# Patient Record
Sex: Female | Born: 1965 | Race: White | Hispanic: No | Marital: Single | Smoking: Current every day smoker
Health system: Southern US, Community
[De-identification: ages and names within clinical notes are randomized; demographics above are authoritative.]

## PROBLEM LIST (undated history)

## (undated) DIAGNOSIS — K589 Irritable bowel syndrome without diarrhea: Secondary | ICD-10-CM

## (undated) DIAGNOSIS — I10 Essential (primary) hypertension: Secondary | ICD-10-CM

## (undated) DIAGNOSIS — F329 Major depressive disorder, single episode, unspecified: Secondary | ICD-10-CM

## (undated) DIAGNOSIS — M1990 Unspecified osteoarthritis, unspecified site: Secondary | ICD-10-CM

## (undated) DIAGNOSIS — F32A Depression, unspecified: Secondary | ICD-10-CM

## (undated) DIAGNOSIS — F419 Anxiety disorder, unspecified: Secondary | ICD-10-CM

## (undated) HISTORY — PX: APPENDECTOMY: SHX54

## (undated) HISTORY — PX: PILONIDAL CYST EXCISION: SHX744

## (undated) HISTORY — DX: Anxiety disorder, unspecified: F41.9

## (undated) HISTORY — PX: ROTATOR CUFF REPAIR: SHX139

## (undated) HISTORY — DX: Unspecified osteoarthritis, unspecified site: M19.90

---

## 2000-07-15 ENCOUNTER — Other Ambulatory Visit: Admission: RE | Admit: 2000-07-15 | Discharge: 2000-07-15 | Payer: Self-pay | Admitting: *Deleted

## 2000-11-28 ENCOUNTER — Inpatient Hospital Stay (HOSPITAL_COMMUNITY): Admission: RE | Admit: 2000-11-28 | Discharge: 2000-11-28 | Payer: Self-pay | Admitting: *Deleted

## 2001-05-04 ENCOUNTER — Observation Stay (HOSPITAL_COMMUNITY): Admission: AD | Admit: 2001-05-04 | Discharge: 2001-05-05 | Payer: Self-pay | Admitting: Obstetrics & Gynecology

## 2001-08-21 ENCOUNTER — Inpatient Hospital Stay (HOSPITAL_COMMUNITY): Admission: AD | Admit: 2001-08-21 | Discharge: 2001-08-21 | Payer: Self-pay | Admitting: *Deleted

## 2001-08-21 ENCOUNTER — Inpatient Hospital Stay (HOSPITAL_COMMUNITY): Admission: AD | Admit: 2001-08-21 | Discharge: 2001-08-25 | Payer: Self-pay | Admitting: Obstetrics and Gynecology

## 2001-09-24 ENCOUNTER — Other Ambulatory Visit: Admission: RE | Admit: 2001-09-24 | Discharge: 2001-09-24 | Payer: Self-pay | Admitting: *Deleted

## 2002-04-04 ENCOUNTER — Encounter: Payer: Self-pay | Admitting: *Deleted

## 2002-04-04 ENCOUNTER — Emergency Department (HOSPITAL_COMMUNITY): Admission: EM | Admit: 2002-04-04 | Discharge: 2002-04-04 | Payer: Self-pay | Admitting: *Deleted

## 2002-09-27 ENCOUNTER — Other Ambulatory Visit: Admission: RE | Admit: 2002-09-27 | Discharge: 2002-09-27 | Payer: Self-pay | Admitting: *Deleted

## 2003-11-14 ENCOUNTER — Other Ambulatory Visit: Admission: RE | Admit: 2003-11-14 | Discharge: 2003-11-14 | Payer: Self-pay | Admitting: *Deleted

## 2004-03-03 ENCOUNTER — Emergency Department (HOSPITAL_COMMUNITY): Admission: EM | Admit: 2004-03-03 | Discharge: 2004-03-03 | Payer: Self-pay | Admitting: Emergency Medicine

## 2004-03-04 ENCOUNTER — Ambulatory Visit (HOSPITAL_COMMUNITY): Admission: RE | Admit: 2004-03-04 | Discharge: 2004-03-04 | Payer: Self-pay | Admitting: Emergency Medicine

## 2004-06-30 ENCOUNTER — Emergency Department (HOSPITAL_COMMUNITY): Admission: EM | Admit: 2004-06-30 | Discharge: 2004-06-30 | Payer: Self-pay | Admitting: Emergency Medicine

## 2005-01-07 ENCOUNTER — Ambulatory Visit (HOSPITAL_COMMUNITY): Admission: RE | Admit: 2005-01-07 | Discharge: 2005-01-07 | Payer: Self-pay | Admitting: Internal Medicine

## 2005-01-10 ENCOUNTER — Ambulatory Visit (HOSPITAL_COMMUNITY): Admission: RE | Admit: 2005-01-10 | Discharge: 2005-01-10 | Payer: Self-pay | Admitting: Internal Medicine

## 2005-02-12 ENCOUNTER — Encounter (HOSPITAL_COMMUNITY): Admission: RE | Admit: 2005-02-12 | Discharge: 2005-03-20 | Payer: Self-pay | Admitting: Neurosurgery

## 2005-04-07 ENCOUNTER — Emergency Department (HOSPITAL_COMMUNITY): Admission: EM | Admit: 2005-04-07 | Discharge: 2005-04-07 | Payer: Self-pay | Admitting: Emergency Medicine

## 2005-12-07 ENCOUNTER — Emergency Department (HOSPITAL_COMMUNITY): Admission: EM | Admit: 2005-12-07 | Discharge: 2005-12-07 | Payer: Self-pay | Admitting: Emergency Medicine

## 2006-09-23 ENCOUNTER — Ambulatory Visit (HOSPITAL_COMMUNITY): Admission: RE | Admit: 2006-09-23 | Discharge: 2006-09-23 | Payer: Self-pay | Admitting: *Deleted

## 2007-05-04 ENCOUNTER — Emergency Department (HOSPITAL_COMMUNITY): Admission: EM | Admit: 2007-05-04 | Discharge: 2007-05-04 | Payer: Self-pay | Admitting: Emergency Medicine

## 2007-10-05 ENCOUNTER — Ambulatory Visit (HOSPITAL_COMMUNITY): Admission: RE | Admit: 2007-10-05 | Discharge: 2007-10-05 | Payer: Self-pay | Admitting: *Deleted

## 2007-10-12 ENCOUNTER — Encounter: Admission: RE | Admit: 2007-10-12 | Discharge: 2007-10-12 | Payer: Self-pay | Admitting: *Deleted

## 2008-09-24 ENCOUNTER — Emergency Department (HOSPITAL_COMMUNITY): Admission: EM | Admit: 2008-09-24 | Discharge: 2008-09-24 | Payer: Self-pay | Admitting: Emergency Medicine

## 2009-10-08 ENCOUNTER — Ambulatory Visit (HOSPITAL_COMMUNITY): Admission: RE | Admit: 2009-10-08 | Discharge: 2009-10-08 | Payer: Self-pay | Admitting: Obstetrics and Gynecology

## 2009-12-26 ENCOUNTER — Ambulatory Visit (HOSPITAL_COMMUNITY): Admission: RE | Admit: 2009-12-26 | Discharge: 2009-12-26 | Payer: Self-pay | Admitting: Family Medicine

## 2010-09-12 ENCOUNTER — Emergency Department (HOSPITAL_COMMUNITY): Admission: EM | Admit: 2010-09-12 | Discharge: 2010-09-12 | Payer: Self-pay | Admitting: Emergency Medicine

## 2010-09-17 ENCOUNTER — Ambulatory Visit (HOSPITAL_COMMUNITY)
Admission: RE | Admit: 2010-09-17 | Discharge: 2010-09-17 | Payer: Self-pay | Source: Home / Self Care | Admitting: Internal Medicine

## 2010-12-01 ENCOUNTER — Encounter: Payer: Self-pay | Admitting: *Deleted

## 2011-01-01 ENCOUNTER — Other Ambulatory Visit (HOSPITAL_COMMUNITY): Payer: Self-pay | Admitting: Obstetrics and Gynecology

## 2011-01-01 DIAGNOSIS — Z1231 Encounter for screening mammogram for malignant neoplasm of breast: Secondary | ICD-10-CM

## 2011-02-06 ENCOUNTER — Ambulatory Visit (INDEPENDENT_AMBULATORY_CARE_PROVIDER_SITE_OTHER): Payer: PRIVATE HEALTH INSURANCE | Admitting: Otolaryngology

## 2011-02-06 DIAGNOSIS — K2100 Gastro-esophageal reflux disease with esophagitis, without bleeding: Secondary | ICD-10-CM

## 2011-02-06 DIAGNOSIS — R070 Pain in throat: Secondary | ICD-10-CM

## 2011-02-10 ENCOUNTER — Ambulatory Visit (HOSPITAL_COMMUNITY)
Admission: RE | Admit: 2011-02-10 | Discharge: 2011-02-10 | Disposition: A | Discharge status: Home / Self Care | Payer: PRIVATE HEALTH INSURANCE | Source: Ambulatory Visit | Attending: Obstetrics and Gynecology | Admitting: Obstetrics and Gynecology

## 2011-02-10 DIAGNOSIS — Z1231 Encounter for screening mammogram for malignant neoplasm of breast: Secondary | ICD-10-CM | POA: Insufficient documentation

## 2011-03-06 ENCOUNTER — Ambulatory Visit (INDEPENDENT_AMBULATORY_CARE_PROVIDER_SITE_OTHER): Payer: PRIVATE HEALTH INSURANCE | Admitting: Otolaryngology

## 2011-03-06 DIAGNOSIS — D487 Neoplasm of uncertain behavior of other specified sites: Secondary | ICD-10-CM

## 2011-03-06 DIAGNOSIS — H9209 Otalgia, unspecified ear: Secondary | ICD-10-CM

## 2011-03-06 DIAGNOSIS — K2100 Gastro-esophageal reflux disease with esophagitis, without bleeding: Secondary | ICD-10-CM

## 2011-03-06 DIAGNOSIS — R070 Pain in throat: Secondary | ICD-10-CM

## 2011-03-28 NOTE — H&P (Signed)
[facility]  PatientSHANTERRIA, Dawn Edwards Visit Number: [id number] MRN: [id number]          Service Type: OBS Location: 910B 9162 01 Attending Physician:  Lenoard Aden Dictated by:   Lenoard Aden, M.D. Admit Date:  08/21/2001                           History and Physical  CHIEF COMPLAINT:              LGA polyhydramnios for induction.  HISTORY OF PRESENT ILLNESS:  The patient is a 45 year old white female G1, P0, [facility] August 18, 2001 at 40 4/7 weeks with polyhydramnios and estimated fetal weight of 9 pounds for induction.  ALLERGIES:                    ANTI-INFLAMMATORY DRUGS which cause stomach discomfort.  MEDICATIONS:                  Prenatal vitamins.  PREGNANCY HISTORY:            Noncontributory.  GYN HISTORY:                  History of HSV, history of pilonidal cyst removal, history of ovarian cystectomy, history of foot surgery.  FAMILY HISTORY:               Cardiovascular disease and hypertension.  PRENATAL LAB DATA:            Blood type O+, Rh antibody negative, rubella immune, hepatitis B surface antigen negative, HIV nonreactive.  The patient had a triple screen drawn at 16 weeks and declined amniocentesis. She had a normal anatomical survey at 20 weeks, previous history of viral gastroenteritis in the second trimester.  PHYSICAL EXAMINATION:  GENERAL:                      She is a well-developed, well-nourished, white female in no apparent distress.  HEENT:                        Normal.  LUNGS:                        Clear.  HEART:                        Regular rhythm.  ABDOMEN:                      Soft, gravid, nontender. Cervix is fingertip long, vertex and -3.  EXTREMITIES:                  Revealed no ______.  NEUROLOGIC:                   Nonfocal. ST is reactive. Cervidil is placed.  IMPRESSION:                   1. 40 week intrauterine pregnancy.                               2.  Polyhydramnios.  3. Presumed LGA.  PLAN:                         Cervical ripening with Cervidil and proceed with induction. Monitor fetal heart rate. Anticipate attempts at vaginal delivery. Dictated by:   Lenoard Aden, M.D. Attending Physician:  Lenoard Aden DD:  08/22/01 TD:  08/22/01 Job: [id number]

## 2011-03-28 NOTE — H&P (Signed)
[facility]  PatientLeeya, Rusconi Edwards Visit Number: [id number] MRN: [id number]          Service Type: Attending:  Marina Gravel, M.D. Dictated by:   Marina Gravel, M.D. Adm. Date:  08/22/01                           History and Physical  DATE OF BIRTH:                1965-12-09  ADMISSION DIAGNOSES:          1. Post dates pregnancy.                               2. Suspected fetal macrosomia.  PLANNED PROCEDURE:            Induction of labor.  HISTORY OF PRESENT ILLNESS:   Patient is a 45 year old white female gravida 1 para 0 who is admitted at 40 weeks and four days for induction of labor. Ultrasound on August 20, 2001 showed 4069 g fetus with polyhydramnios.  Prenatal care at [facility], Dr. Earlene Plater, complicated by advanced maternal age.  The patient declined amniocentesis but had a normal triple screen. Otherwise, prenatal care has been uncomplicated.  Patient conceived by IUI and is a smoker that stopped during pregnancy.  PAST MEDICAL HISTORY:         None.  PAST SURGICAL HISTORY:        Ovarian cystectomy and appendectomy in childhood.  Also pilonidal cyst removal and foot surgery.  MEDICATIONS:                  Prenatal vitamins.  ALLERGIES:                    None.  HABITS:                       Tobacco use - patient stopped during pregnancy. No alcohol or other drugs.  FAMILY HISTORY:               Noncontributory.  PHYSICAL EXAMINATION:  VITAL SIGNS:                  Weight 201.8, blood pressure 120/80, fetal heart tones 143.  GENERAL:                      Alert and oriented, no acute distress.  SKIN:                         Warm and dry, no lesions.  HEART:                        Regular rate and rhythm.  LUNGS:                        Clear to auscultation.  ABDOMEN:                      Gravid.  Fundal height 40 cm.  PELVIC:                       Cervical exam is 1 cm dilated, uneffaced, high presenting  part, vertex by ultrasound.  PRENATAL LABORATORY DATA:  O positive.  Rubella immune.  Hepatitis B, HIV, GC, chlamydia, group B strep - all negative.  Glucola 179; three-hour OGTT within normal limits.  ASSESSMENT:                   Post dates pregnancy with suspected fetal macrosomia and unfavorable cervix.  PLAN:                         Admission for cervical ripening and subsequent induction of labor.  Discussed with patient potential for increased C section rate, given above risk factors.Dictated by:   Marina Gravel, M.D.  Attending:  Marina Gravel, M.D. DD:  08/20/01 TD:  08/20/01 Job: [id number]

## 2011-03-28 NOTE — Op Note (Signed)
[facility]  PatientKINDA, Dawn Edwards Visit Number: [id number] MRN: [id number]          Service Type: OBS Location: MATC Attending Physician:  Lenoard Aden Proc. Date: 08/22/01 Admit Date:  08/21/2001 Discharge Date: 08/21/2001                             Operative Report  PREOPERATIVE DIAGNOSIS:       Failure to progress.  POSTOPERATIVE DIAGNOSIS:      Failure to progress.  OPERATION:                    Primary low transverse cesarean section.  SURGEON:                      Marina Gravel, M.D.  ASSISTANT:                    Lenoard Aden, M.D.  ANESTHESIA:                   Epidural.  ESTIMATED BLOOD LOSS:         700 cc.  COMPLICATIONS:                None.  DISPOSITION:                  Recovery room, stable.  SPECIMENS:                    None.  DRAINS:                       Foley catheter.  INDICATIONS:                  Patient was recommended to have primary low transverse cesarean section for diagnosis of failure to progress.  Has been 4 to 5 cm for greater than six hours.  Documented adequate labor for approximately two hours.  FINDINGS:                     Viable female infant, 8 and 9 Apgars, weight 8 pounds, 6 ounces. Normal uterus, tubes and ovaries.  DESCRIPTION OF PROCEDURE:     Patient was taken to the operating room with epidural anesthesia in place.  She was prepped and draped in a standard fashion and the bladder was already being drained with a Foley catheter.  Pfannenstiel incision was made with a knife and carried sharply to the underlying fascia.  The fascia was divided in the midline sharply and the incision extended laterally with Mayo scissors.  A Kocher clamp was used to elevate the superior aspect of the incision and the underlying rectus muscles were dissected off sharply, repeated inferiorly in a similar fashion.  The midline of the rectus muscles was identified, the underlying peritoneum and  posterior sheath elevated and entered sharply.  The incision extended superiorly and inferiorly sharply with good visualization of the surrounding organs.  The bladder blade was inserted and the vesicouterine peritoneum identified, elevated, and bladder flap created with sharp and blunt technique.  The uterine incision was made in a low transverse fashion with a knife. Extended laterally with bandage scissors.  The infants head was elevated into the incision and delivered with the assistance of a vacuum with one traction. The nose and mouth were suctioned with the bulb  and the remainder of the infant delivered without difficulty. The cord was then clamped and cut and the infant handed off to the waiting pediatricians.  Two grams of Ancef was given at cord clamp.  The uterus was left in the abdomen and the placenta removed by expression. The uterus was then cleared of all clots and debris.  The uterine incision was inspected.  It was free of extension.  The incision was then closed with a running locked stitch of 0 monocryl. Hemostasis obtained.  The bladder flap was hemostatic.  The subfascial space was inspected and was hemostatic.  The fascia was closed with a running stitch of 0 Vicryl.  The subcutaneous tissue was irrigated and made hemostatic with the Bovie.  The skin was closed with staples.  The patient tolerated the procedure well. There were no complications.  She was taken to the recovery room awake, alert and in stable condition.  All counts were correct per the operating room staff. Attending Physician:  Lenoard Aden DD:  08/22/01 TD:  08/22/01 Job: [id number]

## 2011-03-28 NOTE — Discharge Summary (Signed)
[facility]  PatientSANTINA, Dawn Edwards Visit Number: [id number] MRN: [id number]          Service Type: OBS Location: 910A 9130 01 Attending Physician:  Lenoard Aden Dictated by:   Marina Gravel, M.D. Admit Date:  08/21/2001 Discharge Date: 08/25/2001                             Discharge Summary  ADMISSION DIAGNOSES:          1. Post dates pregnancy                               2. Suspected fetal macrosomia.  DISCHARGE DIAGNOSES:          1. Post dates pregnancy.                               2. Suspected fetal macrosomia.  PROCEDURES:                   1. Induction of labor.                               2. Primary cesarean section for failure to                                  progress.  HISTORY OF PRESENT ILLNESS:   For complete details, please see the History & Physical on the chart.  Briefly, the patient was admitted at 40 weeks and fur days for induction of labor with an estimated fetal weight of 4069 g and polyhydramnios.  HOSPITAL COURSE:              The patient was admitted and cervix ripened using Cytotec overnight, and Pitocin was started the following morning. Membranes were ruptured, and the patient progressed to 4 to 5 cm.  She did not progress beyond this for greater than six hours with documented active labor with IUPC for two hours despite being on Pitocin in the range of 35 mL per minute.  She was, therefore, delivered by primary low transverse cesarean section. Findings at the time of surgery included a viable female infant 8 pounds 6 ounces, Apgars 8/9, normal uterus, tubes, and ovaries.  There were no complications, and the patient tolerated the procedure well.  Postoperatively, the patient rapidly regained her ability to ambulate, void, and tolerate a regular diet.  The patient was noted to be O positive and rubella immune.  Her postoperative hemoglobin was stable and in the normal range.  The patient was  discharged home in satisfactory condition.  DISCHARGE INSTRUCTIONS:       Standard preprinted discharge instructions were to dismissal.  FOLLOWUP:                     [facility] with Dr. Earlene Plater in four to six weeks. Dictated by:   Marina Gravel, M.D. Attending Physician:  Lenoard Aden DD:  08/26/01 TD:  08/26/01 Job: [id number]

## 2012-02-02 ENCOUNTER — Other Ambulatory Visit (HOSPITAL_COMMUNITY): Payer: Self-pay | Admitting: Obstetrics and Gynecology

## 2012-02-02 DIAGNOSIS — Z1231 Encounter for screening mammogram for malignant neoplasm of breast: Secondary | ICD-10-CM

## 2012-03-01 ENCOUNTER — Ambulatory Visit (HOSPITAL_COMMUNITY)
Admission: RE | Admit: 2012-03-01 | Discharge: 2012-03-01 | Disposition: A | Discharge status: Home / Self Care | Payer: BC Managed Care – PPO | Source: Ambulatory Visit | Attending: Obstetrics and Gynecology | Admitting: Obstetrics and Gynecology

## 2012-03-01 DIAGNOSIS — Z1231 Encounter for screening mammogram for malignant neoplasm of breast: Secondary | ICD-10-CM | POA: Insufficient documentation

## 2013-06-24 ENCOUNTER — Emergency Department (HOSPITAL_COMMUNITY): Payer: Medicaid Other

## 2013-06-24 ENCOUNTER — Emergency Department (HOSPITAL_COMMUNITY)
Admission: EM | Admit: 2013-06-24 | Discharge: 2013-06-24 | Disposition: A | Discharge status: Home / Self Care | Payer: Medicaid Other | Attending: Emergency Medicine | Admitting: Emergency Medicine

## 2013-06-24 ENCOUNTER — Encounter (HOSPITAL_COMMUNITY): Payer: Self-pay | Admitting: Emergency Medicine

## 2013-06-24 DIAGNOSIS — R51 Headache: Secondary | ICD-10-CM | POA: Insufficient documentation

## 2013-06-24 DIAGNOSIS — R079 Chest pain, unspecified: Secondary | ICD-10-CM

## 2013-06-24 DIAGNOSIS — R0789 Other chest pain: Secondary | ICD-10-CM | POA: Insufficient documentation

## 2013-06-24 DIAGNOSIS — F17200 Nicotine dependence, unspecified, uncomplicated: Secondary | ICD-10-CM | POA: Insufficient documentation

## 2013-06-24 LAB — BASIC METABOLIC PANEL
Chloride: 103 mEq/L (ref 96–112)
Creatinine, Ser: 0.97 mg/dL (ref 0.50–1.10)
GFR calc Af Amer: 79 mL/min — ABNORMAL LOW (ref >90)
GFR calc non Af Amer: 68 mL/min — ABNORMAL LOW (ref >90)
Glucose, Bld: 108 mg/dL — ABNORMAL HIGH (ref 70–99)
Potassium: 3.2 mEq/L — ABNORMAL LOW (ref 3.5–5.1)
Sodium: 137 mEq/L (ref 135–145)

## 2013-06-24 LAB — CBC WITH DIFFERENTIAL/PLATELET
Basophils Absolute: 0 10*3/uL (ref 0.0–0.1)
Eosinophils Absolute: 0.2 10*3/uL (ref 0.0–0.7)
Hemoglobin: 12.7 g/dL (ref 12.0–15.0)
Lymphocytes Relative: 26 % (ref 12–46)
Lymphs Abs: 2.6 10*3/uL (ref 0.7–4.0)
MCHC: 34.2 g/dL (ref 30.0–36.0)
Monocytes Absolute: 0.6 10*3/uL (ref 0.1–1.0)
Neutrophils Relative %: 66 % (ref 43–77)
RBC: 4.19 MIL/uL (ref 3.87–5.11)
RDW: 12.5 % (ref 11.5–15.5)

## 2013-06-24 NOTE — ED Notes (Signed)
Pt c/o central chest pressure started around 9am. Constant. Denies dizziness/sob/n/v. Denies radiation of chest pressure. Denies anything making it worse or better. Nondiaphoretic. States always "stressed" but no worse today than usual. Headache since July 21st constant. Has not seen pcp for it. Pt c/o "weak eye" to r eye lid. Denies trouble swallowing/walking/speaking. Denies weakness. Diarrhea once this am-denies black or bloody stools.

## 2013-06-24 NOTE — ED Provider Notes (Signed)
CSN: [id number]     Arrival date & time 06/24/13  1328 History  This chart was scribed for Dawn Hutching, MD by Shari Heritage, ED Scribe. The patient was seen in room APA07/APA07. Patient's care was started at 1504.   First MD Initiated Contact with Patient 06/24/13 1504     Chief Complaint  Patient presents with  . Chest Pain    The history is provided by the patient. No language interpreter was used.   HPI Comments: Dawn Edwards is a 47 y.o. female who presents to the Emergency Department complaining of central chest pain onset this morning that is now resolved. She describes the pain as "tightness." Patient is also complaining of an intermittent right parietal headache for the past 2-3 weeks. She reports that pain worsens occasionally with extraocular movements. She has been taking 200 mg ibuprofen at a time which used to provide relief, but no longer improves head pain. She reports right ptosis as well, but she has not seen a physician for this or the recurrent headache. She also reports elevated blood pressure (with most recent reading of 156/99). She says that she has a blood pressure monitor at home, but she usually measures her BP at [facility]. She has never been diagnosed with hypertension by a clinician. She denies shortness of breath, fever, diaphoresis, nausea or vomiting. She has no history of diabetes. Patient's family history includes MI (father at age 41). Patient smokes 1/3 packs of cigarettes per day. She works for a [employer] in [place].   PCP - [place]  History reviewed. No pertinent past medical history. Past Surgical History  Procedure Laterality Date  . Cesarean section     History reviewed. No pertinent family history. History  Substance Use Topics  . Smoking status: Current Every Day Smoker  . Smokeless tobacco: Not on file  . Alcohol Use: No   OB History   Grav Para Term Preterm Abortions TAB SAB Ect Mult Living                 Review of Systems A complete 10  system review of systems was obtained and all systems are negative except as noted in the HPI and PMH.   Allergies  Review of patient's allergies indicates not on file.  Home Medications   Current Outpatient Rx  Name  Route  Sig  Dispense  Refill  . ibuprofen (ADVIL,MOTRIN) 200 MG tablet   Oral   Take 600 mg by mouth every 6 (six) hours as needed for pain.          Triage Vitals: BP 150/67  Pulse 72  Temp(Src) 98.3 F (36.8 C) (Oral)  Resp 20  SpO2 98%  LMP 06/24/2013  Physical Exam  Constitutional: She is oriented to person, place, and time. She appears well-developed and well-nourished.  HENT:  Head: Normocephalic and atraumatic.  Eyes: Conjunctivae and EOM are normal. Pupils are equal, round, and reactive to light.  Neck: Normal range of motion. Neck supple.  Cardiovascular: Normal rate, regular rhythm and normal heart sounds.   Pulmonary/Chest: Effort normal and breath sounds normal. No respiratory distress.  Abdominal: Bowel sounds are normal.  Musculoskeletal: Normal range of motion.  Neurological: She is alert and oriented to person, place, and time.  Skin: Skin is warm and dry. No rash noted.    ED Course   Procedures (including critical care time) DIAGNOSTIC STUDIES: Oxygen Saturation is 98% on room air, normal by my interpretation.    COORDINATION OF  CARE: 3:22 PM- Given patient's reports of having an intermittent headache for the past 2-3 weeks, will order a CT. Do not suspect that her chest pain is cardiac related. Troponin and other labs were normal. Chest x-ray is negative for cardiac abnormalities or pulmonary disease. Stressed the importance of smoking cessation. Patient informed of current plan for treatment and evaluation and agrees with plan at this time.    Labs Reviewed  BASIC METABOLIC PANEL - Abnormal; Notable for the following:    Potassium 3.2 (*)    Glucose, Bld 108 (*)    GFR calc non Af Amer 68 (*)    GFR calc Af Amer 79 (*)    All  other components within normal limits  CBC WITH DIFFERENTIAL  TROPONIN I    Dg Chest 2 View  06/24/2013   *RADIOLOGY REPORT*  Clinical Data: Chest pain  CHEST - 2 VIEW  Comparison: December 26, 2009.  Findings: Cardiomediastinal silhouette appears normal.  No acute pulmonary disease is noted.  Bony thorax is intact.  IMPRESSION: No acute cardiopulmonary abnormality seen.   Original Report Authenticated By: Lupita Raider.,  M.D.   Ct Head Wo Contrast  06/24/2013   *RADIOLOGY REPORT*  Clinical Data:  Right side headache for 1 month  CT HEAD WITHOUT CONTRAST  Technique:  Contiguous axial images were obtained from the base of the skull through the vertex without contrast.  Comparison: 01/07/2005 MRI  Findings: Normal sulcation and attenuation with no evidence of mass.  No evidence of hemorrhage or infarct.  No hydrocephalus. Calvarium is intact.  No evidence of significant sinus inflammatory change on this study. Known  low lying cerebellar tonsil seen on the right.  IMPRESSION: No acute findings.  Low-lying cerebellar tonsils as previously documented.   Original Report Authenticated By: Esperanza Heir, M.D.    Date: 06/24/2013  Rate: 78  Rhythm: normal sinus rhythm  QRS Axis: normal  Intervals: normal  ST/T Wave abnormalities: normal  Conduction Disutrbances: none  Narrative Interpretation: unremarkable    No diagnosis found.  MDM  Low clinical suspicion for acute coronary syndrome or pulmonary embolus. Chest pain symptoms were fleeting.  CT head negative     I personally performed the services described in this documentation, which was scribed in my presence. The recorded information has been reviewed and is accurate.    Dawn Hutching, MD 06/24/13 [phone]

## 2013-06-24 NOTE — Progress Notes (Signed)
ED/CM noted patient did not have health insurance and/or PCP listed in the computer.  Patient was given the [place] resource handout with information on the clinics, food pantries, and the handout for new health insurance sign-up.  Patient expressed appreciation for this. 

## 2015-01-29 ENCOUNTER — Ambulatory Visit (HOSPITAL_COMMUNITY)
Admission: RE | Admit: 2015-01-29 | Discharge: 2015-01-29 | Disposition: A | Discharge status: Home / Self Care | Payer: 59 | Source: Ambulatory Visit | Attending: Physician Assistant | Admitting: Physician Assistant

## 2015-01-29 ENCOUNTER — Other Ambulatory Visit (HOSPITAL_COMMUNITY): Payer: Self-pay | Admitting: Physician Assistant

## 2015-01-29 DIAGNOSIS — S5012XA Contusion of left forearm, initial encounter: Secondary | ICD-10-CM | POA: Diagnosis not present

## 2015-01-29 DIAGNOSIS — M79632 Pain in left forearm: Secondary | ICD-10-CM | POA: Insufficient documentation

## 2015-01-29 DIAGNOSIS — W19XXXA Unspecified fall, initial encounter: Secondary | ICD-10-CM | POA: Insufficient documentation

## 2015-01-29 DIAGNOSIS — M79602 Pain in left arm: Secondary | ICD-10-CM

## 2015-01-29 DIAGNOSIS — M7989 Other specified soft tissue disorders: Secondary | ICD-10-CM | POA: Insufficient documentation

## 2015-05-31 ENCOUNTER — Ambulatory Visit (HOSPITAL_COMMUNITY): Payer: 59 | Attending: Orthopedic Surgery

## 2015-05-31 ENCOUNTER — Encounter (HOSPITAL_COMMUNITY): Payer: Self-pay

## 2015-05-31 DIAGNOSIS — Z9889 Other specified postprocedural states: Secondary | ICD-10-CM

## 2015-05-31 DIAGNOSIS — M6289 Other specified disorders of muscle: Secondary | ICD-10-CM

## 2015-05-31 DIAGNOSIS — R29898 Other symptoms and signs involving the musculoskeletal system: Secondary | ICD-10-CM | POA: Insufficient documentation

## 2015-05-31 DIAGNOSIS — M629 Disorder of muscle, unspecified: Secondary | ICD-10-CM | POA: Diagnosis present

## 2015-05-31 DIAGNOSIS — M25611 Stiffness of right shoulder, not elsewhere classified: Secondary | ICD-10-CM | POA: Diagnosis present

## 2015-05-31 DIAGNOSIS — M25511 Pain in right shoulder: Secondary | ICD-10-CM | POA: Diagnosis present

## 2015-05-31 NOTE — Therapy (Signed)
[place], [place], [address] Phone: [phone]   Fax:  [phone]  Occupational Therapy Evaluation  Patient Details  Name: Dawn Edwards MRN: [id number] Date of Birth: 12/16/1965 Referring Provider:  Netta Cedars, MD  Encounter Date: 05/31/2015      OT End of Session - 05/31/15 1443    Visit Number 1   Number of Visits 24   Date for OT Re-Evaluation 07/30/15  mini reassessment: 06/28/15   Authorization Type U[employer]   Authorization Time Period 60 visit limit. Patient has used 0 this year.   Authorization - Visit Number 1   Authorization - Number of Visits 7   OT Start Time 9211   OT Stop Time 1430   OT Time Calculation (min) 45 min   Activity Tolerance Patient tolerated treatment well   Behavior During Therapy WFL for tasks assessed/performed      History reviewed. No pertinent past medical history.  Past Surgical History  Procedure Laterality Date  . Cesarean section      There were no vitals filed for this visit.  Visit Diagnosis:  S/P rotator cuff repair - Plan: Ot plan of care cert/re-cert  Pain in joint, shoulder region, right - Plan: Ot plan of care cert/re-cert  Tight fascia - Plan: Ot plan of care cert/re-cert  Shoulder weakness - Plan: Ot plan of care cert/re-cert  Shoulder joint stiffness, right - Plan: Ot plan of care cert/re-cert      Subjective Assessment - 05/31/15 1349    Subjective  S: I had to wait to have the surgery because I was in school and then I had to wait to get insurance.    Pertinent History Patient is a 49 y/o female s/p right rotator cuff repair surgery on 05/28/15. Pt reports that she fell 3 years ago which was the initial cause of the injury. Pt reports that she had a tear in her supraspinatus, a bone spur, and a labrum tear. Pt was told after sx to wear her sling when she is out in the community and she may take it off at home. Dr. Veverly Fells has referred patient to  occupational therapy for evaluation and treatment.    Limitations Protocol per MD: AAROM, increase gentle ROM, isometric exercises. NO abduction.   Special Tests FOTO score: 0/100   Patient Stated Goals --  increased pain 10/10 with movement   Currently in Pain? No/denies           [facility] OT Assessment - 05/31/15 1350    Assessment   Diagnosis Right rotator cuff repair   Onset Date --  fell 3 years ago. Sx on 05/28/15   Assessment June 12, 2015 - Dr. Veverly Fells   Prior Therapy [facility] [phone] for right shoulder dislocation.    Precautions   Precautions Shoulder   Shoulder Interventions Shoulder sling/immobilizer   Restrictions   Weight Bearing Restrictions Yes   Balance Screen   Has the patient fallen in the past 6 months Yes   How many times? 1   Has the patient had a decrease in activity level because of a fear of falling?  No   Is the patient reluctant to leave their home because of a fear of falling?  No   Home  Environment   Family/patient expects to be discharged to: Private residence   Living Arrangements Children   Additional Comments Pt's friend is staying with her to help her for a short time.  Prior Function   Level of Independence Independent   Vocation Full time employment   Vocation Requirements  alot of overhead movements, emptying boxes   ADL   ADL comments Difficulty completing any activity using right arm as dominant extremity. Pt is single mom to 3 year son.    Mobility   Mobility Status Independent;History of falls   Written Expression   Dominant Hand Right   Vision - History   Baseline Vision Wears glasses all the time   Cognition   Overall Cognitive Status Within Functional Limits for tasks assessed   ROM / Strength   AROM / PROM / Strength AROM;PROM;Strength   Palpation   Palpation comment Max fascial restrictions in right upper arm, trapezius, and scapularis region.    AROM   Overall AROM  Unable to assess;Due to precautions   AROM Assessment Site  Shoulder   Right/Left Shoulder Right   PROM   Overall PROM Comments Assessed supine. IR/ER adducted.   PROM Assessment Site Shoulder   Right/Left Shoulder Right   Right Shoulder Flexion 70 Degrees   Right Shoulder ABduction --  No abduction per MD   Right Shoulder Internal Rotation 90 Degrees   Right Shoulder External Rotation 10 Degrees   Strength   Overall Strength Unable to assess;Due to precautions   Strength Assessment Site Shoulder   Right/Left Shoulder Right                         OT Education - 05/31/15 1443    Education provided Yes   Education Details Table slides, pendulums, wrist and elbow AROM.    Person(s) Educated Patient   Methods Explanation;Demonstration;Handout   Comprehension Returned demonstration;Verbalized understanding          OT Short Term Goals - 05/31/15 1558    OT SHORT TERM GOAL #1   Title Patient will be independent and educated on HEP.   Time 6   Period Weeks   Status New   OT SHORT TERM GOAL #2   Title Patient will increase PROM to [facility] to increase ability to get shirts on and off with less difficulty.    Time 6   Period Weeks   Status New   OT SHORT TERM GOAL #3   Title Patient will decrease fascial restrctions from max to mod amount.    Time 6   Period Weeks   Status New   OT SHORT TERM GOAL #4   Title Patient will decrease pain level to 5/10 with daily tasks.   Time 6   Period Weeks   Status New   OT SHORT TERM GOAL #5   Title Patient will increase right shoulder strength to 3/5 to increase ability to complete household tasks at home with less difficulty.    Time 6   Period Weeks   Status New           OT Long Term Goals - 05/31/15 1615    OT LONG TERM GOAL #1   Title Patient will return to highest level of independence with all daily, leisure, and work tasks.   Time 12   Period Weeks   Status New   OT LONG TERM GOAL #2   Title Patient will increase Right shoulder AROM to WNL to increase ability  to complete overhead tasks as work.    Time 12   Period Weeks   Status New   OT LONG TERM GOAL #3   Title Patient  will increase right shoulder strength to 4+/5 to increase ability to complete work related tasks such as lifting, etc.   Time 12   Period Weeks   Status New   OT LONG TERM GOAL #4   Title Patient will decrease fascial restrictions from mod to min amount.    Time 12   Period Weeks   Status New   OT LONG TERM GOAL #5   Title Patient will decrease pain level to 3/10 or less during daily tasks.    Time 12   Period Weeks   Status New               Plan - 05/31/15 1456    Clinical Impression Statement A: Patient is a 49 y/o female s/p right rotator cuff repair resulting in increased pain and fascial restrictions and decreased strength and ROM causing difficulty using RUE during daily tasks as dominant extremity.    Pt will benefit from skilled therapeutic intervention in order to improve on the following deficits (Retired) Decreased range of motion;Increased fascial restricitons;Pain;Impaired UE functional use;Decreased strength   Rehab Potential Excellent   OT Frequency 2x / week   OT Duration 12 weeks   OT Treatment/Interventions Self-care/ADL training;Therapeutic exercise;Patient/family education;Manual Therapy;Neuromuscular education;Ultrasound;Iontophoresis;Therapeutic activities;Parrafin;Cryotherapy;Electrical Stimulation;Passive range of motion;Moist Heat   Plan P: Patient will benefit from skilled OT services to increase functional performance during daily tasks using RUE as dominant extremity. Treatment Plan: Myofascial release. muscle energy technique, passive stretching, PROM for 4 weeks (until 06/25/15), Start AAROM (8/15-9/12) progress to AROM (9/12-10/10) and strengthening as tolerated. At evaluation: MD protocol. AAROM, gentle ROM, isometrics. NO abduction.    Consulted and Agree with Plan of Care Patient        Problem List There are no active problems  to display for this patient.   Ailene Ravel, OTR/L,CBIS  [phone]  05/31/2015, 4:53 PM  [place] [address] [place], [place], [address] Phone: [phone]   Fax:  [phone]

## 2015-05-31 NOTE — Patient Instructions (Signed)
TOWEL SLIDES COMPLETE FOR 1-3 MINUTES, 3-5 TIMES PER DAY  SHOULDER: Flexion On Table   Place hands on table, elbows straight. Move hips away from body. Press hands down into table.   Abduction (Passive)   With arm out to side, resting on table, lower head toward arm, keeping trunk away from table.  Copyright  VHI. All rights reserved.     Internal Rotation (Assistive)   Seated with elbow bent at right angle and held against side, slide arm on table surface in an inward arc. Repeat ____ times. Do ____ sessions per day. Activity: Use this motion to brush crumbs off the table.  Copyright  VHI. All rights reserved.    COMPLETE PENDULUM EXERCISES FOR 30 SECONDS TO A MINUTE EACH, 3-5 TIMES PER DAY. ROM: Pendulum (Side-to-Side)   Leaning over at the waist. Rock side to side using the momentum of your body only. 10 times. 2-3 times.  http://orth.exer.us/792   Copyright  VHI. All rights reserved.  Pendulum Forward/Back   Bend forward 90 at waist, using table for support. Rock body forward and back to swing arm. Repeat _10___ times. Do _2___ sessions per day.  Copyright  VHI. All rights reserved.  Pendulum Circular   Bend forward 90 at waist, leaning on table for support. Rock body in a circular pattern to move arm clockwise _10___ times then counterclockwise _10___ times. Do _2-3___ sessions per day.  Copyright  VHI. All rights reserved.  AROM: Wrist Extension   With right palm down, bend wrist up. Repeat 10____ times per set. Do ____ sets per session. Do __3__ sessions per day.  Copyright  VHI. All rights reserved.   AROM: Wrist Flexion   With right palm up, bend wrist up. Repeat ___10_ times per set. Do ____ sets per session. Do __3__ sessions per day.  Copyright  VHI. All rights reserved.   AROM: Forearm Pronation / Supination   With right arm in handshake position, slowly rotate palm down until stretch is felt. Relax. Then rotate palm up until  stretch is felt. Repeat __10__ times per set. Do ____ sets per session. Do __3__ sessions per day.  Copyright  VHI. All rights reserved.   AFlexion (Passive)   Use other hand to bend elbow, with thumb toward same shoulder. Do NOT force this motion.  Repeat __10__ times. Do ____ sessions per day.

## 2015-06-05 ENCOUNTER — Encounter (HOSPITAL_COMMUNITY): Payer: Self-pay

## 2015-06-05 ENCOUNTER — Ambulatory Visit (HOSPITAL_COMMUNITY): Payer: 59

## 2015-06-05 DIAGNOSIS — R29898 Other symptoms and signs involving the musculoskeletal system: Secondary | ICD-10-CM

## 2015-06-05 DIAGNOSIS — Z9889 Other specified postprocedural states: Secondary | ICD-10-CM | POA: Diagnosis not present

## 2015-06-05 DIAGNOSIS — M25611 Stiffness of right shoulder, not elsewhere classified: Secondary | ICD-10-CM

## 2015-06-05 DIAGNOSIS — M629 Disorder of muscle, unspecified: Secondary | ICD-10-CM

## 2015-06-05 DIAGNOSIS — M6289 Other specified disorders of muscle: Secondary | ICD-10-CM

## 2015-06-05 NOTE — Therapy (Signed)
[place], [place], [address] Phone: [phone]   Fax:  [phone]  Occupational Therapy Treatment  Patient Details  Name: Dawn COCA MRN: [id number] Date of Birth: February 06, 1966 Referring Provider:  Netta Cedars, MD  Encounter Date: 06/05/2015      OT End of Session - 06/05/15 1110    Visit Number 2   Number of Visits 24   Date for OT Re-Evaluation 07/30/15  mini reassessment: 06/28/15   Authorization Type UHC   Authorization Time Period 60 visit limit. Patient has used 0 this year.   Authorization - Visit Number 2   Authorization - Number of Visits 44   OT Start Time [phone]   OT Stop Time 1015   OT Time Calculation (min) 38 min   Activity Tolerance Patient tolerated treatment well   Behavior During Therapy [facility] for tasks assessed/performed      History reviewed. No pertinent past medical history.  Past Surgical History  Procedure Laterality Date  . Cesarean section      There were no vitals filed for this visit.  Visit Diagnosis:  Tight fascia  Shoulder weakness  Shoulder joint stiffness, right      Subjective Assessment - 06/05/15 1002    Subjective  S: I'm still having trouble flipping my palm up.    Currently in Pain? No/denies                      OT Treatments/Exercises (OP) - 06/05/15 0001    Exercises   Exercises Shoulder   Shoulder Exercises: Supine   Protraction PROM;10 reps   Horizontal ABduction PROM;10 reps  Horizontal Adduction only    External Rotation PROM;10 reps   Internal Rotation PROM;10 reps   Flexion PROM;10 reps   ABduction --  NO abduction   Other Supine Exercises Bridges 12X   Shoulder Exercises: Seated   Elevation AROM;10 reps   Extension AROM;10 reps   Row AROM;10 reps   Shoulder Exercises: Therapy Ball   Flexion 10 reps   ABduction --  No abduction   Shoulder Exercises: Isometric Strengthening   Flexion Supine;3X5"   Extension  Supine;3X5"   External Rotation Supine;3X5"   Internal Rotation Supine;3X5"   ADduction Supine;3X5"   Manual Therapy   Manual Therapy Myofascial release   Myofascial Release Myofascial release to left upper arm, trapezious, and scapularis region to decrease fascial restrictions and increase joint mobility in a pain free zone.                 OT Education - 06/05/15 1111    Education provided Yes   Education Details Pt given a print out of OT evaluation   Person(s) Educated Patient   Methods Explanation;Demonstration;Handout   Comprehension Verbalized understanding;Returned demonstration          OT Short Term Goals - 06/05/15 1117    OT SHORT TERM GOAL #1   Title Patient will be independent and educated on HEP.   Status On-going   OT SHORT TERM GOAL #2   Title Patient will increase PROM to [facility] to increase ability to get shirts on and off with less difficulty.    Status On-going   OT SHORT TERM GOAL #3   Title Patient will decrease fascial restrctions from max to mod amount.    Status On-going   OT SHORT TERM GOAL #4   Title Patient will decrease pain level to 5/10 with daily tasks.  Status On-going   OT SHORT TERM GOAL #5   Title Patient will increase right shoulder strength to 3/5 to increase ability to complete household tasks at home with less difficulty.    Status On-going           OT Long Term Goals - 06/05/15 1117    OT LONG TERM GOAL #1   Title Patient will return to highest level of independence with all daily, leisure, and work tasks.   Status On-going   OT LONG TERM GOAL #2   Title Patient will increase Right shoulder AROM to WNL to increase ability to complete overhead tasks as work.    Status On-going   OT LONG TERM GOAL #3   Title Patient will increase right shoulder strength to 4+/5 to increase ability to complete work related tasks such as lifting, etc.   Status On-going   OT LONG TERM GOAL #4   Title Patient will decrease fascial  restrictions from mod to min amount.    Status On-going   OT LONG TERM GOAL #5   Title Patient will decrease pain level to 3/10 or less during daily tasks.    Status On-going               Plan - 06/05/15 1111    Clinical Impression Statement A: Initiated myofascial release, manual stretching, AROM elbow and forearm, scapular AROM, and therapy ball stretches. Pt tolerated well.    Plan P: Add anterior and caudel glides.        Problem List There are no active problems to display for this patient.   Ailene Ravel, OTR/L,CBIS  [phone]  06/05/2015, 11:27 AM  [place] [address] [place], [place], [address] Phone: [phone]   Fax:  [phone]

## 2015-06-07 ENCOUNTER — Ambulatory Visit (HOSPITAL_COMMUNITY): Payer: 59

## 2015-06-07 ENCOUNTER — Encounter (HOSPITAL_COMMUNITY): Payer: Self-pay

## 2015-06-07 DIAGNOSIS — M25611 Stiffness of right shoulder, not elsewhere classified: Secondary | ICD-10-CM

## 2015-06-07 DIAGNOSIS — Z9889 Other specified postprocedural states: Secondary | ICD-10-CM | POA: Diagnosis not present

## 2015-06-07 DIAGNOSIS — R29898 Other symptoms and signs involving the musculoskeletal system: Secondary | ICD-10-CM

## 2015-06-07 DIAGNOSIS — M25511 Pain in right shoulder: Secondary | ICD-10-CM

## 2015-06-07 DIAGNOSIS — M6289 Other specified disorders of muscle: Secondary | ICD-10-CM

## 2015-06-07 DIAGNOSIS — M629 Disorder of muscle, unspecified: Secondary | ICD-10-CM

## 2015-06-07 NOTE — Patient Instructions (Signed)
New Shoulder Stretches  1) Place a rolled towel into your right arm pit. Grab hold of your right wrist with your left hand and gently pull to your left. Hold stretch for 10 seconds. Repeat 3 times.  2) Hold doorknob with your right hand. Turn your body away from door as if you were going to walk away. Hold stretch for 10 seconds. Repeat 3 times.

## 2015-06-07 NOTE — Therapy (Signed)
[place], [place], [address] Phone: [phone]   Fax:  [phone]  Occupational Therapy Treatment  Patient Details  Name: Dawn Edwards MRN: [id number] Date of Birth: 30-Mar-1966 Referring Provider:  Netta Cedars, MD  Encounter Date: 06/07/2015      OT End of Session - 06/07/15 1559    Visit Number 3   Number of Visits 24   Date for OT Re-Evaluation 07/30/15  mini reassessment: 06/28/15   Authorization Type UHC   Authorization Time Period 60 visit limit. Patient has used 0 this year.   Authorization - Visit Number 3   Authorization - Number of Visits 83   OT Start Time 8264   OT Stop Time 1600   OT Time Calculation (min) 43 min   Activity Tolerance Patient tolerated treatment well   Behavior During Therapy WFL for tasks assessed/performed      History reviewed. No pertinent past medical history.  Past Surgical History  Procedure Laterality Date  . Cesarean section      There were no vitals filed for this visit.  Visit Diagnosis:  Tight fascia  Shoulder weakness  Shoulder joint stiffness, right  Pain in joint, shoulder region, right      Subjective Assessment - 06/07/15 1539    Subjective  S: Last night it felt like it was jammed. Today it's ok.   Currently in Pain? Yes   Pain Score 1    Pain Location Shoulder   Pain Orientation Right   Pain Descriptors / Indicators Aching   Pain Type Acute pain            [facility] OT Assessment - 06/07/15 1540    Assessment   Diagnosis Right rotator cuff repair   Precautions   Precautions Shoulder   Type of Shoulder Precautions Per MD: No abduction. AAROM, gentle ROM, isometrics.   Shoulder Interventions Shoulder sling/immobilizer                  OT Treatments/Exercises (OP) - 06/07/15 1540    Exercises   Exercises Shoulder   Shoulder Exercises: Supine   Protraction PROM;10 reps   Horizontal ABduction PROM;10 reps  Horizontal  adduction only   External Rotation PROM;10 reps   Internal Rotation PROM;10 reps   Flexion PROM;10 reps   ABduction --  NO abduction   Other Supine Exercises Bridges 15X   Shoulder Exercises: Seated   Elevation AROM;12 reps   Extension AROM;12 reps   Row AROM;12 reps   Shoulder Exercises: Therapy Ball   Flexion 15 reps   ABduction --  No abduction   Shoulder Exercises: Isometric Strengthening   Flexion Supine;3X5"   Extension Supine;3X5"   External Rotation Supine;3X5"   Internal Rotation Supine;3X5"   ADduction Supine;3X5"   Manual Therapy   Manual Therapy Myofascial release   Myofascial Release Myofascial release to left upper arm, trapezious, and scapularis region to decrease fascial restrictions and increase joint mobility in a pain free zone.                 OT Education - 06/07/15 1559    Education provided Yes   Education Details Shoulder stretches: caudal glide and anterior glide   Person(s) Educated Patient   Methods Explanation;Demonstration;Handout   Comprehension Verbalized understanding;Returned demonstration          OT Short Term Goals - 06/05/15 1117    OT SHORT TERM GOAL #1   Title Patient will be independent  and educated on HEP.   Status On-going   OT SHORT TERM GOAL #2   Title Patient will increase PROM to [facility] to increase ability to get shirts on and off with less difficulty.    Status On-going   OT SHORT TERM GOAL #3   Title Patient will decrease fascial restrctions from max to mod amount.    Status On-going   OT SHORT TERM GOAL #4   Title Patient will decrease pain level to 5/10 with daily tasks.   Status On-going   OT SHORT TERM GOAL #5   Title Patient will increase right shoulder strength to 3/5 to increase ability to complete household tasks at home with less difficulty.    Status On-going           OT Long Term Goals - 06/05/15 1117    OT LONG TERM GOAL #1   Title Patient will return to highest level of independence with  all daily, leisure, and work tasks.   Status On-going   OT LONG TERM GOAL #2   Title Patient will increase Right shoulder AROM to WNL to increase ability to complete overhead tasks as work.    Status On-going   OT LONG TERM GOAL #3   Title Patient will increase right shoulder strength to 4+/5 to increase ability to complete work related tasks such as lifting, etc.   Status On-going   OT LONG TERM GOAL #4   Title Patient will decrease fascial restrictions from mod to min amount.    Status On-going   OT LONG TERM GOAL #5   Title Patient will decrease pain level to 3/10 or less during daily tasks.    Status On-going               Plan - 06/07/15 1559    Clinical Impression Statement A: Added anterior glide and caudal glide stretch for shoulder. patient tolerated well. Added stretches to HEP. Pt was given certification order to take to MD appt on Tuesday.   Plan P: Add pro/ret/elev/dep and thumb tacks        Problem List There are no active problems to display for this patient.   Ailene Ravel, OTR/L,CBIS  [phone]  06/07/2015, 4:01 PM  [facility] [address] [place], [place], [address] Phone: [phone]   Fax:  [phone]

## 2015-06-12 ENCOUNTER — Ambulatory Visit (HOSPITAL_COMMUNITY): Payer: 59 | Attending: Orthopedic Surgery

## 2015-06-12 DIAGNOSIS — M25511 Pain in right shoulder: Secondary | ICD-10-CM | POA: Diagnosis present

## 2015-06-12 DIAGNOSIS — R29898 Other symptoms and signs involving the musculoskeletal system: Secondary | ICD-10-CM | POA: Diagnosis present

## 2015-06-12 DIAGNOSIS — M25611 Stiffness of right shoulder, not elsewhere classified: Secondary | ICD-10-CM | POA: Diagnosis present

## 2015-06-12 DIAGNOSIS — M6289 Other specified disorders of muscle: Secondary | ICD-10-CM

## 2015-06-12 DIAGNOSIS — M629 Disorder of muscle, unspecified: Secondary | ICD-10-CM | POA: Insufficient documentation

## 2015-06-12 NOTE — Therapy (Signed)
[place], [place], [address] Phone: [phone]   Fax:  [phone]  Occupational Therapy Treatment  Patient Details  Name: Dawn Edwards MRN: [id number] Date of Birth: [birth year] Referring Provider:  Netta Cedars, MD  Encounter Date: 06/12/2015      OT End of Session - 06/12/15 1521    Visit Number 4   Number of Visits 24   Date for OT Re-Evaluation 07/30/15  mini reassessment: 06/28/15   Authorization Type UHC   Authorization Time Period 60 visit limit. Patient has used 0 this year.   Authorization - Visit Number 4   Authorization - Number of Visits 73   OT Start Time 1435   OT Stop Time 1515   OT Time Calculation (min) 40 min   Activity Tolerance Patient tolerated treatment well   Behavior During Therapy WFL for tasks assessed/performed      No past medical history on file.  Past Surgical History  Procedure Laterality Date  . Cesarean section      There were no vitals filed for this visit.  Visit Diagnosis:  Tight fascia  Shoulder weakness  Shoulder joint stiffness, right      Subjective Assessment - 06/12/15 1506    Subjective  S: I saw the MD. He wants me to work on ER and my posture.   Limitations Protocol per MD: AAROM, increase gentle ROM, isometric exercises. NO abduction.   Currently in Pain? No/denies            [facility] OT Assessment - 06/12/15 1506    Assessment   Diagnosis Right rotator cuff repair   Precautions   Precautions Shoulder   Type of Shoulder Precautions Per MD: No abduction. AAROM, gentle ROM, isometrics.   Shoulder Interventions Shoulder sling/immobilizer                  OT Treatments/Exercises (OP) - 06/12/15 1502    Exercises   Exercises Shoulder   Shoulder Exercises: Supine   Protraction PROM;10 reps   Horizontal ABduction PROM;10 reps   External Rotation PROM;10 reps   Internal Rotation PROM;10 reps   Flexion PROM;10 reps   ABduction --   No abduction   Shoulder Exercises: Seated   Elevation AROM;12 reps   Extension AROM;12 reps   Row AROM;12 reps   Shoulder Exercises: Therapy Ball   Flexion 15 reps   ABduction --  no abduction   Shoulder Exercises: ROM/Strengthening   Anterior Glide 3x10"   Caudal Glide 3x10"   Shoulder Exercises: Isometric Strengthening   Flexion Supine;5X5"   Extension Supine;5X5"   External Rotation Supine;5X5"   Internal Rotation Supine;5X5"   ABduction --  No abduction   ADduction Supine;5X5"   Manual Therapy   Manual Therapy Myofascial release   Myofascial Release Myofascial release to left upper arm, trapezious, and scapularis region to decrease fascial restrictions and increase joint mobility in a pain free zone.                   OT Short Term Goals - 06/05/15 1117    OT SHORT TERM GOAL #1   Title Patient will be independent and educated on HEP.   Status On-going   OT SHORT TERM GOAL #2   Title Patient will increase PROM to [facility] to increase ability to get shirts on and off with less difficulty.    Status On-going   OT SHORT TERM GOAL #3   Title Patient will  decrease fascial restrctions from max to mod amount.    Status On-going   OT SHORT TERM GOAL #4   Title Patient will decrease pain level to 5/10 with daily tasks.   Status On-going   OT SHORT TERM GOAL #5   Title Patient will increase right shoulder strength to 3/5 to increase ability to complete household tasks at home with less difficulty.    Status On-going           OT Long Term Goals - 06/05/15 1117    OT LONG TERM GOAL #1   Title Patient will return to highest level of independence with all daily, leisure, and work tasks.   Status On-going   OT LONG TERM GOAL #2   Title Patient will increase Right shoulder AROM to WNL to increase ability to complete overhead tasks as work.    Status On-going   OT LONG TERM GOAL #3   Title Patient will increase right shoulder strength to 4+/5 to increase ability to  complete work related tasks such as lifting, etc.   Status On-going   OT LONG TERM GOAL #4   Title Patient will decrease fascial restrictions from mod to min amount.    Status On-going   OT LONG TERM GOAL #5   Title Patient will decrease pain level to 3/10 or less during daily tasks.    Status On-going               Plan - 06/12/15 1522    Clinical Impression Statement A: Increased isometrics to 5x5. Pt tolerated well.    Plan P: Add pro/ret/elev/dept and thumb tacks. Call MD and inquire about if he'd like [place] to do abduction or continue to refrain.        Problem List There are no active problems to display for this patient.   Ailene Ravel, OTR/L,CBIS  [phone]  06/12/2015, 4:14 PM  [facility] [address] [place], [place], [address] Phone: [phone]   Fax:  [phone]

## 2015-06-14 ENCOUNTER — Encounter (HOSPITAL_COMMUNITY): Payer: Self-pay

## 2015-06-14 ENCOUNTER — Ambulatory Visit (HOSPITAL_COMMUNITY): Payer: 59

## 2015-06-14 DIAGNOSIS — M629 Disorder of muscle, unspecified: Secondary | ICD-10-CM | POA: Diagnosis not present

## 2015-06-14 DIAGNOSIS — M6289 Other specified disorders of muscle: Secondary | ICD-10-CM

## 2015-06-14 DIAGNOSIS — R29898 Other symptoms and signs involving the musculoskeletal system: Secondary | ICD-10-CM

## 2015-06-14 DIAGNOSIS — M25611 Stiffness of right shoulder, not elsewhere classified: Secondary | ICD-10-CM

## 2015-06-14 NOTE — Therapy (Signed)
[place], [place], [address] Phone: [phone]   Fax:  [phone]  Occupational Therapy Treatment  Patient Details  Name: Dawn Edwards MRN: [id number] Date of Birth: 1966/04/07 Referring Provider:  Netta Cedars, MD  Encounter Date: 06/14/2015      OT End of Session - 06/14/15 1431    Visit Number 5   Number of Visits 24   Date for OT Re-Evaluation 07/30/15  mini reassessment: 06/28/15   Authorization Type UHC   Authorization Time Period 60 visit limit. Patient has used 0 this year.   Authorization - Visit Number 5   Authorization - Number of Visits 60   OT Start Time 0712   OT Stop Time 1430   OT Time Calculation (min) 45 min   Activity Tolerance Patient tolerated treatment well   Behavior During Therapy WFL for tasks assessed/performed      History reviewed. No pertinent past medical history.  Past Surgical History  Procedure Laterality Date  . Cesarean section      There were no vitals filed for this visit.  Visit Diagnosis:  Tight fascia  Shoulder weakness  Shoulder joint stiffness, right      Subjective Assessment - 06/14/15 1427    Subjective  S: I woke up at 4am again with extreme pain.    Currently in Pain? No/denies            [facility] OT Assessment - 06/14/15 1428    Assessment   Diagnosis Right rotator cuff repair   Precautions   Precautions Shoulder   Type of Shoulder Precautions Per MD: No abduction. AAROM, gentle ROM, isometrics.                  OT Treatments/Exercises (OP) - 06/14/15 1429    Exercises   Exercises Shoulder   Shoulder Exercises: Supine   Protraction PROM;10 reps   Horizontal ABduction PROM;10 reps   External Rotation PROM;10 reps   Internal Rotation PROM;10 reps   Flexion PROM;10 reps   ABduction --  No abduction   Shoulder Exercises: Seated   Elevation AROM;15 reps   Extension AROM;15 reps   Row AROM;15 reps   Shoulder Exercises:  Therapy Ball   Flexion 15 reps   ABduction --  No abduction   Shoulder Exercises: ROM/Strengthening   Thumb Tacks 1'   Prot/Ret//Elev/Dep 1'   Manual Therapy   Manual Therapy Myofascial release   Myofascial Release Myofascial release to left upper arm, trapezious, and scapularis region to decrease fascial restrictions and increase joint mobility in a pain free zone.                   OT Short Term Goals - 06/05/15 1117    OT SHORT TERM GOAL #1   Title Patient will be independent and educated on HEP.   Status On-going   OT SHORT TERM GOAL #2   Title Patient will increase PROM to [facility] to increase ability to get shirts on and off with less difficulty.    Status On-going   OT SHORT TERM GOAL #3   Title Patient will decrease fascial restrctions from max to mod amount.    Status On-going   OT SHORT TERM GOAL #4   Title Patient will decrease pain level to 5/10 with daily tasks.   Status On-going   OT SHORT TERM GOAL #5   Title Patient will increase right shoulder strength to 3/5 to increase ability to  complete household tasks at home with less difficulty.    Status On-going           OT Long Term Goals - 06/05/15 1117    OT LONG TERM GOAL #1   Title Patient will return to highest level of independence with all daily, leisure, and work tasks.   Status On-going   OT LONG TERM GOAL #2   Title Patient will increase Right shoulder AROM to WNL to increase ability to complete overhead tasks as work.    Status On-going   OT LONG TERM GOAL #3   Title Patient will increase right shoulder strength to 4+/5 to increase ability to complete work related tasks such as lifting, etc.   Status On-going   OT LONG TERM GOAL #4   Title Patient will decrease fascial restrictions from mod to min amount.    Status On-going   OT LONG TERM GOAL #5   Title Patient will decrease pain level to 3/10 or less during daily tasks.    Status On-going               Plan - 06/14/15 1432     Clinical Impression Statement A: Added thumb tacks and pro/elev/dep/ret. Pt tolerated well. Pt states there is pain with thumb tacks even at low level.   Plan P: Call MD and inquire about if he'd like [place] to do abduction or continue to refrain. Cont to follow protocol. Increase PROM as able to tolerate to increase comfort when completing thumb tacks.        Problem List There are no active problems to display for this patient.   Ailene Ravel, OTR/L,CBIS  [phone]  06/14/2015, 2:35 PM  [facility] [address] [place], [place], [address] Phone: [phone]   Fax:  [phone]

## 2015-06-15 ENCOUNTER — Encounter (HOSPITAL_COMMUNITY): Payer: Self-pay

## 2015-06-15 NOTE — Therapy (Signed)
[facility] [address] [place], [place], [address] Phone: [phone]   Fax:  [phone]  Patient Details  Name: Dawn Edwards MRN: [id number] Date of Birth: [birth year] Referring Provider:  No ref. provider found  Encounter Date: 06/15/2015  Called office for Dr. Netta Cedars in [place] to inquire if we are able to start abduction during therapy. At evaluation, his orders were not to perform abduction. Left message at call center. Waiting to hear reply.   Ailene Ravel, OTR/L,CBIS  [phone]  06/15/2015, 2:05 PM  [facility] [address] [place], [place], [address] Phone: [phone]   Fax:  [phone]

## 2015-06-18 ENCOUNTER — Ambulatory Visit (HOSPITAL_COMMUNITY): Payer: 59

## 2015-06-18 ENCOUNTER — Encounter (HOSPITAL_COMMUNITY): Payer: Self-pay

## 2015-06-18 DIAGNOSIS — M6289 Other specified disorders of muscle: Secondary | ICD-10-CM

## 2015-06-18 DIAGNOSIS — R29898 Other symptoms and signs involving the musculoskeletal system: Secondary | ICD-10-CM

## 2015-06-18 DIAGNOSIS — M629 Disorder of muscle, unspecified: Secondary | ICD-10-CM | POA: Diagnosis not present

## 2015-06-18 DIAGNOSIS — M25611 Stiffness of right shoulder, not elsewhere classified: Secondary | ICD-10-CM

## 2015-06-18 NOTE — Therapy (Signed)
[place], [place], [address] Phone: [phone]   Fax:  [phone]  Occupational Therapy Treatment  Patient Details  Name: Dawn Edwards MRN: [id number] Date of Birth: 1966-10-13 Referring Provider:  Netta Cedars, MD  Encounter Date: 06/18/2015      OT End of Session - 06/18/15 0835    Visit Number 6   Number of Visits 24   Date for OT Re-Evaluation 07/30/15  mini reassessment: 06/28/15   Authorization Type UHC   Authorization Time Period 60 visit limit. Patient has used 0 this year.   Authorization - Visit Number 6   Authorization - Number of Visits 19   OT Start Time 0800   OT Stop Time 0845   OT Time Calculation (min) 45 min   Activity Tolerance Patient tolerated treatment well   Behavior During Therapy [facility] for tasks assessed/performed      History reviewed. No pertinent past medical history.  Past Surgical History  Procedure Laterality Date  . Cesarean section      There were no vitals filed for this visit.  Visit Diagnosis:  Tight fascia  Shoulder weakness  Shoulder joint stiffness, right      Subjective Assessment - 06/18/15 0826    Subjective  S: This morning it was burning after the shower for 5 minutes.   Limitations Protocol per MD: AAROM, increase gentle ROM, isometric exercises. NO abduction.   Currently in Pain? No/denies                      OT Treatments/Exercises (OP) - 06/18/15 0826    Exercises   Exercises Shoulder   Shoulder Exercises: Supine   Protraction PROM;10 reps   Horizontal ABduction PROM;10 reps   External Rotation PROM;10 reps   Internal Rotation PROM;10 reps   Flexion PROM;10 reps   ABduction --  No abduction   Other Supine Exercises Bridges 15X   Shoulder Exercises: Seated   Elevation AROM;15 reps   Extension AROM;15 reps   Row AROM;15 reps   Shoulder Exercises: Therapy Ball   Flexion 20 reps   ABduction --  no abduction   Shoulder  Exercises: ROM/Strengthening   Thumb Tacks 1'   Anterior Glide 3x10"   Caudal Glide 3x10"   Prot/Ret//Elev/Dep 1'   Shoulder Exercises: Isometric Strengthening   Flexion Supine;5X5"   Extension Supine;5X5"   External Rotation Supine;5X5"   Internal Rotation Supine;5X5"   ABduction --  no abduction   ADduction Supine;5X5"   Manual Therapy   Manual Therapy Myofascial release   Myofascial Release Myofascial release to left upper arm, trapezious, and scapularis region to decrease fascial restrictions and increase joint mobility in a pain free zone.                   OT Short Term Goals - 06/05/15 1117    OT SHORT TERM GOAL #1   Title Patient will be independent and educated on HEP.   Status On-going   OT SHORT TERM GOAL #2   Title Patient will increase PROM to [facility] to increase ability to get shirts on and off with less difficulty.    Status On-going   OT SHORT TERM GOAL #3   Title Patient will decrease fascial restrctions from max to mod amount.    Status On-going   OT SHORT TERM GOAL #4   Title Patient will decrease pain level to 5/10 with daily tasks.   Status On-going  OT SHORT TERM GOAL #5   Title Patient will increase right shoulder strength to 3/5 to increase ability to complete household tasks at home with less difficulty.    Status On-going           OT Long Term Goals - 06/05/15 1117    OT LONG TERM GOAL #1   Title Patient will return to highest level of independence with all daily, leisure, and work tasks.   Status On-going   OT LONG TERM GOAL #2   Title Patient will increase Right shoulder AROM to WNL to increase ability to complete overhead tasks as work.    Status On-going   OT LONG TERM GOAL #3   Title Patient will increase right shoulder strength to 4+/5 to increase ability to complete work related tasks such as lifting, etc.   Status On-going   OT LONG TERM GOAL #4   Title Patient will decrease fascial restrictions from mod to min amount.     Status On-going   OT LONG TERM GOAL #5   Title Patient will decrease pain level to 3/10 or less during daily tasks.    Status On-going               Plan - 06/18/15 0835    Clinical Impression Statement A: No message from Dr. Veverly Fells this AM. Pt reports that she stopped taking her pain medication as she doesn't want to become addicted to it. Slight increase in pain during thumb tacks and pro/ret/elev/dep.    Plan P: Call MD once more if nothing is heard by end of day Monday. Increase isometrics to 10x10.        Problem List There are no active problems to display for this patient.   Ailene Ravel, OTR/L,CBIS  [phone]  06/18/2015, 8:41 AM  [facility] [address] [place], [place], [address] Phone: [phone]   Fax:  [phone]

## 2015-06-22 ENCOUNTER — Encounter (HOSPITAL_COMMUNITY): Payer: Self-pay

## 2015-06-22 ENCOUNTER — Encounter (HOSPITAL_COMMUNITY): Payer: 59

## 2015-06-22 ENCOUNTER — Ambulatory Visit (HOSPITAL_COMMUNITY): Payer: 59

## 2015-06-22 DIAGNOSIS — M629 Disorder of muscle, unspecified: Secondary | ICD-10-CM

## 2015-06-22 DIAGNOSIS — M25611 Stiffness of right shoulder, not elsewhere classified: Secondary | ICD-10-CM

## 2015-06-22 DIAGNOSIS — M6289 Other specified disorders of muscle: Secondary | ICD-10-CM

## 2015-06-22 DIAGNOSIS — R29898 Other symptoms and signs involving the musculoskeletal system: Secondary | ICD-10-CM

## 2015-06-22 NOTE — Therapy (Signed)
[place], [place], [address] Phone: [phone]   Fax:  [phone]  Occupational Therapy Treatment  Patient Details  Name: Dawn Edwards MRN: [id number] Date of Birth: [birth year] Referring Provider:  Cory Munch, PA-C  Encounter Date: 06/22/2015      OT End of Session - 06/22/15 1651    Visit Number 7   Number of Visits 24   Date for OT Re-Evaluation 07/30/15  mini reassessment: 06/28/15   Authorization Type UHC   Authorization Time Period 60 visit limit. Patient has used 0 this year.   Authorization - Visit Number 7   Authorization - Number of Visits 34   OT Start Time 1346   OT Stop Time 1430   OT Time Calculation (min) 44 min   Activity Tolerance Patient tolerated treatment well   Behavior During Therapy WFL for tasks assessed/performed      History reviewed. No pertinent past medical history.  Past Surgical History  Procedure Laterality Date  . Cesarean section      There were no vitals filed for this visit.  Visit Diagnosis:  Tight fascia  Shoulder weakness  Shoulder joint stiffness, right      Subjective Assessment - 06/22/15 1413    Subjective  S: It feels real tight today.   Limitations Protocol per MD: AAROM, increase gentle ROM, isometric exercises. NO abduction.   Currently in Pain? No/denies            [facility] OT Assessment - 06/22/15 1413    Assessment   Diagnosis Right rotator cuff repair   Precautions   Precautions Shoulder   Type of Shoulder Precautions Per MD: No abduction. AAROM, gentle ROM, isometrics.                  OT Treatments/Exercises (OP) - 06/22/15 1413    Exercises   Exercises Shoulder   Shoulder Exercises: Supine   Protraction PROM;10 reps   Horizontal ABduction PROM;10 reps   External Rotation PROM;10 reps   Internal Rotation PROM;10 reps   Flexion PROM;10 reps   ABduction --  no abduction   Shoulder Exercises: Seated   Elevation AROM;15 reps   Extension AROM;15 reps   Row AROM;15 reps   Shoulder Exercises: Therapy Ball   Flexion 20 reps   ABduction --  no abduction   Shoulder Exercises: ROM/Strengthening   Thumb Tacks 1'   Anterior Glide 3x10"   Caudal Glide 3x10"   Prot/Ret//Elev/Dep 1'   Shoulder Exercises: Isometric Strengthening   Flexion Supine;5X10"   Extension Supine;5X10"   External Rotation Supine;5X10"   Internal Rotation Supine;5X10"   ABduction --  no abduction   ADduction Supine;5X10"   Manual Therapy   Manual Therapy Myofascial release;Muscle Energy Technique   Myofascial Release Myofascial release to left upper arm, trapezious, and scapularis region to decrease fascial restrictions and increase joint mobility in a pain free zone.    Muscle Energy Technique Muscle energy technique to right anterior deltoid to relax tone and muscle spasm and increase ROM.                  OT Short Term Goals - 06/05/15 1117    OT SHORT TERM GOAL #1   Title Patient will be independent and educated on HEP.   Status On-going   OT SHORT TERM GOAL #2   Title Patient will increase PROM to [facility] to increase ability to get shirts on and off with less  difficulty.    Status On-going   OT SHORT TERM GOAL #3   Title Patient will decrease fascial restrctions from max to mod amount.    Status On-going   OT SHORT TERM GOAL #4   Title Patient will decrease pain level to 5/10 with daily tasks.   Status On-going   OT SHORT TERM GOAL #5   Title Patient will increase right shoulder strength to 3/5 to increase ability to complete household tasks at home with less difficulty.    Status On-going           OT Long Term Goals - 06/05/15 1117    OT LONG TERM GOAL #1   Title Patient will return to highest level of independence with all daily, leisure, and work tasks.   Status On-going   OT LONG TERM GOAL #2   Title Patient will increase Right shoulder AROM to WNL to increase ability to complete  overhead tasks as work.    Status On-going   OT LONG TERM GOAL #3   Title Patient will increase right shoulder strength to 4+/5 to increase ability to complete work related tasks such as lifting, etc.   Status On-going   OT LONG TERM GOAL #4   Title Patient will decrease fascial restrictions from mod to min amount.    Status On-going   OT LONG TERM GOAL #5   Title Patient will decrease pain level to 3/10 or less during daily tasks.    Status On-going               Plan - 06/22/15 1655    Clinical Impression Statement A: Message received from Dr. Veverly Fells' office stating that he does not like ABduction. Added muscle energy technique and patient had great results with flexion and ER.    Plan P: Progress to AAROM supine.        Problem List There are no active problems to display for this patient.   Ailene Ravel, OTR/L,CBIS  [phone]  06/22/2015, 4:57 PM  [facility] [address] [place], [place], [address] Phone: [phone]   Fax:  [phone]

## 2015-06-25 ENCOUNTER — Ambulatory Visit (HOSPITAL_COMMUNITY): Payer: 59

## 2015-06-27 ENCOUNTER — Encounter (HOSPITAL_COMMUNITY): Payer: 59

## 2015-06-28 ENCOUNTER — Ambulatory Visit (HOSPITAL_COMMUNITY): Payer: 59

## 2015-06-28 ENCOUNTER — Encounter (HOSPITAL_COMMUNITY): Payer: 59

## 2015-06-28 ENCOUNTER — Encounter (HOSPITAL_COMMUNITY): Payer: Self-pay

## 2015-06-28 DIAGNOSIS — M25611 Stiffness of right shoulder, not elsewhere classified: Secondary | ICD-10-CM

## 2015-06-28 DIAGNOSIS — M629 Disorder of muscle, unspecified: Secondary | ICD-10-CM

## 2015-06-28 DIAGNOSIS — M6289 Other specified disorders of muscle: Secondary | ICD-10-CM

## 2015-06-28 DIAGNOSIS — R29898 Other symptoms and signs involving the musculoskeletal system: Secondary | ICD-10-CM

## 2015-06-28 NOTE — Therapy (Signed)
[place], [place], [address] Phone: [phone]   Fax:  [phone]  Occupational Therapy Treatment  Patient Details  Name: Dawn Edwards MRN: [id number] Date of Birth: 1965-12-27 Referring Provider:  Netta Cedars, MD  Encounter Date: 06/28/2015      OT End of Session - 06/28/15 1421    Visit Number 8   Number of Visits 24   Date for OT Re-Evaluation 07/30/15   Authorization Type UHC   Authorization Time Period 60 visit limit. Patient has used 0 this year.   Authorization - Visit Number 8   Authorization - Number of Visits 60   OT Start Time 1350   OT Stop Time 1430   OT Time Calculation (min) 40 min   Activity Tolerance Patient tolerated treatment well   Behavior During Therapy WFL for tasks assessed/performed      History reviewed. No pertinent past medical history.  Past Surgical History  Procedure Laterality Date  . Cesarean section      There were no vitals filed for this visit.  Visit Diagnosis:  Tight fascia  Shoulder weakness  Shoulder joint stiffness, right      Subjective Assessment - 06/28/15 1412    Subjective  S: I had my friend stretch it the other day like you do. It really helped. Her husband has had the same surgery.    Currently in Pain? No/denies            [facility] OT Assessment - 06/28/15 1403    Assessment   Diagnosis Right rotator cuff repair   Precautions   Precautions Shoulder   Type of Shoulder Precautions Per MD: No abduction. AAROM, gentle ROM, isometrics.   AROM   Overall AROM  Unable to assess;Due to precautions   PROM   Overall PROM Comments Assessed supine. IR/ER adducted.   PROM Assessment Site Shoulder   Right/Left Shoulder Right   Right Shoulder Flexion 146 Degrees  eval: 70   Right Shoulder ABduction --  no abduction per MD   Right Shoulder Internal Rotation 90 Degrees  same at eval   Right Shoulder External Rotation 28 Degrees  eval: 10    Strength   Overall Strength Unable to assess;Due to precautions   Strength Assessment Site Shoulder   Right/Left Shoulder Right                  OT Treatments/Exercises (OP) - 06/28/15 1419    Exercises   Exercises Shoulder   Shoulder Exercises: Supine   Protraction PROM;AAROM;10 reps   Horizontal ABduction PROM;AAROM;10 reps  adduction only   External Rotation PROM;AAROM;10 reps   Internal Rotation PROM;AAROM;10 reps   Flexion PROM;AAROM;10 reps   ABduction --  no abduction   Shoulder Exercises: ROM/Strengthening   Wall Wash 1'   Manual Therapy   Manual Therapy Myofascial release;Muscle Energy Technique   Myofascial Release Myofascial release to left upper arm, trapezious, and scapularis region to decrease fascial restrictions and increase joint mobility in a pain free zone.    Muscle Energy Technique Muscle energy technique to right anterior deltoid to relax tone and muscle spasm and increase ROM.                OT Education - 06/28/15 1427    Education provided Yes   Education Details AAROM exercises   Person(s) Educated Patient   Methods Explanation;Demonstration;Handout   Comprehension Returned demonstration;Verbalized understanding  OT Short Term Goals - 06/28/15 1420    OT SHORT TERM GOAL #1   Title Patient will be independent and educated on HEP.   Status On-going   OT SHORT TERM GOAL #2   Title Patient will increase PROM to [facility] to increase ability to get shirts on and off with less difficulty.    Status On-going   OT SHORT TERM GOAL #3   Title Patient will decrease fascial restrctions from max to mod amount.    Status Achieved   OT SHORT TERM GOAL #4   Title Patient will decrease pain level to 5/10 with daily tasks.   Status Achieved   OT SHORT TERM GOAL #5   Title Patient will increase right shoulder strength to 3/5 to increase ability to complete household tasks at home with less difficulty.    Status On-going            OT Long Term Goals - 06/05/15 1117    OT LONG TERM GOAL #1   Title Patient will return to highest level of independence with all daily, leisure, and work tasks.   Status On-going   OT LONG TERM GOAL #2   Title Patient will increase Right shoulder AROM to WNL to increase ability to complete overhead tasks as work.    Status On-going   OT LONG TERM GOAL #3   Title Patient will increase right shoulder strength to 4+/5 to increase ability to complete work related tasks such as lifting, etc.   Status On-going   OT LONG TERM GOAL #4   Title Patient will decrease fascial restrictions from mod to min amount.    Status On-going   OT LONG TERM GOAL #5   Title Patient will decrease pain level to 3/10 or less during daily tasks.    Status On-going               Plan - 06/28/15 1455    Clinical Impression Statement A: Mini reassessment completed this date. patient has met 2/5 STGs and is currently progressing towards remaining therapy goals. Per MD, patient is not to complete abduction at this time. Progressed to AAROM supine and added to HEP. Pt tolerated well.    Plan P: Follow up on AAROM HEP given last session. Progress to AAROM standing. Add proximal shoulder strengthening supine.        Problem List There are no active problems to display for this patient.   Ailene Ravel, OTR/L,CBIS  [phone]  06/28/2015, 3:01 PM  [facility] [address] [place], [place], [address] Phone: [phone]   Fax:  [phone]

## 2015-06-28 NOTE — Patient Instructions (Signed)
Perform each exercise ___10_____ reps. 2-3x days.   Protraction - STANDING  Start by holding a wand or cane at chest height.  Next, slowly push the wand outwards in front of your body so that your elbows become fully straightened. Then, return to the original position.     Shoulder FLEXION - STANDING - PALMS UP  In the standing position, hold a wand/cane with both arms, palms up on both sides. Raise up the wand/cane allowing your unaffected arm to perform most of the effort. Your affected arm should be partially relaxed.      Internal/External ROTATION - STANDING  In the standing position, hold a wand/cane with both hands keeping your elbows bent. Move your arms and wand/cane to one side.  Your affected arm should be partially relaxed while your unaffected arm performs most of the effort.         Straight arms holding cane at shoulder height, bring cane to center then to the left only.   Copyright  VHI. All rights reserved.

## 2015-06-29 ENCOUNTER — Ambulatory Visit (HOSPITAL_COMMUNITY): Payer: 59 | Admitting: Specialist

## 2015-07-02 ENCOUNTER — Encounter (HOSPITAL_COMMUNITY): Payer: Self-pay

## 2015-07-02 ENCOUNTER — Ambulatory Visit (HOSPITAL_COMMUNITY): Payer: 59

## 2015-07-02 DIAGNOSIS — M6289 Other specified disorders of muscle: Secondary | ICD-10-CM

## 2015-07-02 DIAGNOSIS — M25611 Stiffness of right shoulder, not elsewhere classified: Secondary | ICD-10-CM

## 2015-07-02 DIAGNOSIS — M629 Disorder of muscle, unspecified: Secondary | ICD-10-CM | POA: Diagnosis not present

## 2015-07-02 DIAGNOSIS — R29898 Other symptoms and signs involving the musculoskeletal system: Secondary | ICD-10-CM

## 2015-07-02 NOTE — Therapy (Signed)
[facility] [place], [place], [address] Phone: [phone]   Fax:  [phone]  Occupational Therapy Treatment  Patient Details  Name: Dawn Edwards MRN: [id number] Date of Birth: June 28, 1966 Referring Provider:  Netta Cedars, MD  Encounter Date: 07/02/2015      OT End of Session - 07/02/15 1018    Visit Number 9   Number of Visits 24   Date for OT Re-Evaluation 07/30/15   Authorization Type [employer]   Authorization Time Period 60 visit limit. Patient has used 0 this year.   Authorization - Visit Number 9   Authorization - Number of Visits 60   OT Start Time 0930   OT Stop Time 1015   OT Time Calculation (min) 45 min   Activity Tolerance Patient tolerated treatment well   Behavior During Therapy WFL for tasks assessed/performed      History reviewed. No pertinent past medical history.  Past Surgical History  Procedure Laterality Date  . Cesarean section      There were no vitals filed for this visit.  Visit Diagnosis:  Shoulder weakness  Tight fascia  Shoulder joint stiffness, right      Subjective Assessment - 07/02/15 0948    Subjective  S: It just feels super tight today.   Currently in Pain? No/denies            [facility] OT Assessment - 07/02/15 0949    Assessment   Diagnosis Right rotator cuff repair   Precautions   Precautions Shoulder   Type of Shoulder Precautions Per MD: No abduction. AAROM, gentle ROM, isometrics.   Shoulder Interventions Shoulder sling/immobilizer                  OT Treatments/Exercises (OP) - 07/02/15 0949    Exercises   Exercises Shoulder   Shoulder Exercises: Supine   Protraction PROM;AAROM;10 reps   Horizontal ABduction PROM;AAROM;10 reps  only adduction   External Rotation PROM;AAROM;10 reps   Internal Rotation PROM;AAROM;10 reps   Flexion PROM;AAROM;10 reps   ABduction --  no abduction   Shoulder Exercises: Standing   Protraction AAROM;5 reps   Horizontal ABduction AAROM;5 reps  only adduction   External Rotation AAROM;5 reps   Internal Rotation AAROM;5 reps   Flexion AAROM;5 reps   ABduction --  no abduction   Shoulder Exercises: ROM/Strengthening   Wall Wash 1'   Thumb Tacks 1'   Proximal Shoulder Strengthening, Supine 10X no rest breaks   Proximal Shoulder Strengthening, Seated 10X with rest breaks   Prot/Ret//Elev/Dep 1'   Manual Therapy   Manual Therapy Myofascial release;Muscle Energy Technique   Myofascial Release Myofascial release to left upper arm, trapezious, and scapularis region to decrease fascial restrictions and increase joint mobility in a pain free zone.    Muscle Energy Technique Muscle energy technique to right anterior deltoid to relax tone and muscle spasm and increase ROM.                  OT Short Term Goals - 07/02/15 1020    OT SHORT TERM GOAL #1   Title Patient will be independent and educated on HEP.   Status On-going   OT SHORT TERM GOAL #2   Title Patient will increase PROM to [facility] to increase ability to get shirts on and off with less difficulty.    Status On-going   OT SHORT TERM GOAL #3   Title Patient will decrease fascial restrctions from max to  mod amount.    OT SHORT TERM GOAL #4   Title Patient will decrease pain level to 5/10 with daily tasks.   OT SHORT TERM GOAL #5   Title Patient will increase right shoulder strength to 3/5 to increase ability to complete household tasks at home with less difficulty.    Status On-going           OT Long Term Goals - 06/05/15 1117    OT LONG TERM GOAL #1   Title Patient will return to highest level of independence with all daily, leisure, and work tasks.   Status On-going   OT LONG TERM GOAL #2   Title Patient will increase Right shoulder AROM to WNL to increase ability to complete overhead tasks as work.    Status On-going   OT LONG TERM GOAL #3   Title Patient will increase right shoulder strength to 4+/5 to increase ability  to complete work related tasks such as lifting, etc.   Status On-going   OT LONG TERM GOAL #4   Title Patient will decrease fascial restrictions from mod to min amount.    Status On-going   OT LONG TERM GOAL #5   Title Patient will decrease pain level to 3/10 or less during daily tasks.    Status On-going               Plan - 07/02/15 1019    Clinical Impression Statement A: Added AAROM standing, proximal shoulder strengthening supine and standing. Pt completed with complaints of pain.    Plan P: Add pulleys (flexion only)        Problem List There are no active problems to display for this patient.   Ailene Ravel, OTR/L,CBIS  [phone]  07/02/2015, 10:21 AM  [facility] [address] [place], [place], [address] Phone: [phone]   Fax:  [phone]

## 2015-07-05 ENCOUNTER — Encounter (HOSPITAL_COMMUNITY): Payer: Self-pay

## 2015-07-05 ENCOUNTER — Ambulatory Visit (HOSPITAL_COMMUNITY): Payer: 59

## 2015-07-05 DIAGNOSIS — M629 Disorder of muscle, unspecified: Secondary | ICD-10-CM

## 2015-07-05 DIAGNOSIS — R29898 Other symptoms and signs involving the musculoskeletal system: Secondary | ICD-10-CM

## 2015-07-05 DIAGNOSIS — M25511 Pain in right shoulder: Secondary | ICD-10-CM

## 2015-07-05 DIAGNOSIS — M6289 Other specified disorders of muscle: Secondary | ICD-10-CM

## 2015-07-05 DIAGNOSIS — M25611 Stiffness of right shoulder, not elsewhere classified: Secondary | ICD-10-CM

## 2015-07-05 NOTE — Therapy (Signed)
[facility] [place], [place], [address] Phone: [phone]   Fax:  [phone]  Occupational Therapy Treatment  Patient Details  Name: Dawn Edwards MRN: [id number] Date of Birth: 25-Sep-1966 Referring Provider:  Cory Munch, PA-C  Encounter Date: 07/05/2015      OT End of Session - 07/05/15 1716    Visit Number 10   Number of Visits 24   Date for OT Re-Evaluation 07/30/15   Authorization Type UHC   Authorization Time Period 60 visit limit. Patient has used 0 this year.   Authorization - Visit Number 10   Authorization - Number of Visits 60   OT Start Time 4403   OT Stop Time 1600   OT Time Calculation (min) 42 min   Activity Tolerance Patient tolerated treatment well   Behavior During Therapy WFL for tasks assessed/performed      History reviewed. No pertinent past medical history.  Past Surgical History  Procedure Laterality Date  . Cesarean section      There were no vitals filed for this visit.  Visit Diagnosis:  Shoulder weakness  Tight fascia  Shoulder joint stiffness, right  Pain in joint, shoulder region, right      Subjective Assessment - 07/05/15 1541    Subjective  S: I had to ice it when I left here.    Currently in Pain? Yes   Pain Score 2    Pain Location Shoulder   Pain Orientation Right   Pain Descriptors / Indicators Aching   Pain Type Acute pain            [facility] OT Assessment - 07/05/15 1541    Assessment   Diagnosis Right rotator cuff repair   Precautions   Precautions Shoulder   Type of Shoulder Precautions Per MD: No abduction. AAROM, gentle ROM, isometrics.   Shoulder Interventions Shoulder sling/immobilizer                  OT Treatments/Exercises (OP) - 07/05/15 1541    Exercises   Exercises Shoulder   Shoulder Exercises: Supine   Protraction PROM;AAROM;10 reps   Horizontal ABduction PROM;AAROM;10 reps  Adduction only   External Rotation  PROM;10 reps   Internal Rotation PROM;10 reps   Flexion PROM;AAROM;10 reps   ABduction --  No abduction   Shoulder Exercises: Standing   Protraction AAROM;10 reps   ABduction --  no abduction   Shoulder Exercises: ROM/Strengthening   Wall Wash 1'   Proximal Shoulder Strengthening, Supine 10X no rest breaks   Proximal Shoulder Strengthening, Seated 10X with rest breaks   Prot/Ret//Elev/Dep 1'   Manual Therapy   Manual Therapy Myofascial release;Muscle Energy Technique   Myofascial Release Myofascial release to left upper arm, trapezious, and scapularis region to decrease fascial restrictions and increase joint mobility in a pain free zone.    Muscle Energy Technique Muscle energy technique to right anterior deltoid to relax tone and muscle spasm and increase ROM.                  OT Short Term Goals - 07/02/15 1020    OT SHORT TERM GOAL #1   Title Patient will be independent and educated on HEP.   Status On-going   OT SHORT TERM GOAL #2   Title Patient will increase PROM to [facility] to increase ability to get shirts on and off with less difficulty.    Status On-going   OT SHORT TERM GOAL #3  Title Patient will decrease fascial restrctions from max to mod amount.    OT SHORT TERM GOAL #4   Title Patient will decrease pain level to 5/10 with daily tasks.   OT SHORT TERM GOAL #5   Title Patient will increase right shoulder strength to 3/5 to increase ability to complete household tasks at home with less difficulty.    Status On-going           OT Long Term Goals - 06/05/15 1117    OT LONG TERM GOAL #1   Title Patient will return to highest level of independence with all daily, leisure, and work tasks.   Status On-going   OT LONG TERM GOAL #2   Title Patient will increase Right shoulder AROM to WNL to increase ability to complete overhead tasks as work.    Status On-going   OT LONG TERM GOAL #3   Title Patient will increase right shoulder strength to 4+/5 to increase  ability to complete work related tasks such as lifting, etc.   Status On-going   OT LONG TERM GOAL #4   Title Patient will decrease fascial restrictions from mod to min amount.    Status On-going   OT LONG TERM GOAL #5   Title Patient will decrease pain level to 3/10 or less during daily tasks.    Status On-going               Plan - 07/05/15 1717    Clinical Impression Statement A: Added pulleys in flexion only. Patient was unable to complete AAROM ER/ER supine or standing this date due to fatigue and soreness from manual stretching. Pt reports that she sees Dr. Veverly Fells soon for a follow up visit. Pt was given last measurements to take to her appt.    Plan P Cont to follow protocol. Increase to 12 repetitions during supine AAROM as able.        Problem List There are no active problems to display for this patient.   Ailene Ravel, OTR/L,CBIS  [phone]  07/05/2015, 5:19 PM  [facility] [address] [place], [place], [address] Phone: [phone]   Fax:  [phone]

## 2015-07-11 ENCOUNTER — Encounter (HOSPITAL_COMMUNITY): Payer: Self-pay

## 2015-07-11 ENCOUNTER — Ambulatory Visit (HOSPITAL_COMMUNITY): Payer: 59

## 2015-07-11 DIAGNOSIS — M6289 Other specified disorders of muscle: Secondary | ICD-10-CM

## 2015-07-11 DIAGNOSIS — M629 Disorder of muscle, unspecified: Secondary | ICD-10-CM

## 2015-07-11 DIAGNOSIS — M25511 Pain in right shoulder: Secondary | ICD-10-CM

## 2015-07-11 DIAGNOSIS — R29898 Other symptoms and signs involving the musculoskeletal system: Secondary | ICD-10-CM

## 2015-07-11 DIAGNOSIS — M25611 Stiffness of right shoulder, not elsewhere classified: Secondary | ICD-10-CM

## 2015-07-11 NOTE — Therapy (Signed)
[facility] [address] [place], [place], [address] Phone: [phone]   Fax:  [phone]  Occupational Therapy Treatment  Patient Details  Name: Dawn Edwards MRN: [id number] Date of Birth: 1966/08/12 Referring Provider:  Cory Munch, PA-C  Encounter Date: 07/11/2015      OT End of Session - 07/11/15 1452    Visit Number 11   Number of Visits 24   Date for OT Re-Evaluation 07/30/15   Authorization Type U[employer]   Authorization Time Period 60 visit limit. Patient has used 0 this year.   Authorization - Visit Number 12   Authorization - Number of Visits 60   OT Start Time 1350   OT Stop Time 1430   OT Time Calculation (min) 40 min   Activity Tolerance Patient tolerated treatment well   Behavior During Therapy WFL for tasks assessed/performed      History reviewed. No pertinent past medical history.  Past Surgical History  Procedure Laterality Date  . Cesarean section      There were no vitals filed for this visit.  Visit Diagnosis:  Shoulder weakness  Tight fascia  Shoulder joint stiffness, right  Pain in joint, shoulder region, right      Subjective Assessment - 07/11/15 1413    Subjective  S: I saw the doctor. He said you were doing a good job.    Currently in Pain? Yes   Pain Score 2    Pain Location Shoulder   Pain Orientation Right   Pain Descriptors / Indicators Aching   Pain Type Acute pain            [facility] OT Assessment - 07/11/15 1414    Assessment   Diagnosis Right rotator cuff repair   Precautions   Precautions Shoulder   Type of Shoulder Precautions Per MD: Ok to complete Abduction. Work on [employer]. Close PREs. Modalities PRN. Start AROM 07/23/15.                  OT Treatments/Exercises (OP) - 07/11/15 1415    Exercises   Exercises Shoulder   Shoulder Exercises: Supine   Protraction PROM;AAROM;12 reps   Horizontal ABduction PROM;10 reps;AAROM;12 reps   External Rotation  PROM;10 reps;AAROM;12 reps   Internal Rotation PROM;10 reps;AAROM;12 reps   Flexion PROM;10 reps;AAROM;12 reps   ABduction PROM;AAROM;10 reps   Shoulder Exercises: Standing   Protraction AAROM;10 reps   Horizontal ABduction AAROM;10 reps   External Rotation AAROM;10 reps   Internal Rotation AAROM;10 reps   Flexion AAROM;10 reps   ABduction AAROM;10 reps   Manual Therapy   Manual Therapy Myofascial release;Muscle Energy Technique   Myofascial Release Myofascial release to left upper arm, trapezious, and scapularis region to decrease fascial restrictions and increase joint mobility in a pain free zone.    Muscle Energy Technique Muscle energy technique to right anterior deltoid to relax tone and muscle spasm and increase ROM.                  OT Short Term Goals - 07/02/15 1020    OT SHORT TERM GOAL #1   Title Patient will be independent and educated on HEP.   Status On-going   OT SHORT TERM GOAL #2   Title Patient will increase PROM to [facility] to increase ability to get shirts on and off with less difficulty.    Status On-going   OT SHORT TERM GOAL #3   Title Patient will decrease fascial restrctions  from max to mod amount.    OT SHORT TERM GOAL #4   Title Patient will decrease pain level to 5/10 with daily tasks.   OT SHORT TERM GOAL #5   Title Patient will increase right shoulder strength to 3/5 to increase ability to complete household tasks at home with less difficulty.    Status On-going           OT Long Term Goals - 06/05/15 1117    OT LONG TERM GOAL #1   Title Patient will return to highest level of independence with all daily, leisure, and work tasks.   Status On-going   OT LONG TERM GOAL #2   Title Patient will increase Right shoulder AROM to WNL to increase ability to complete overhead tasks as work.    Status On-going   OT LONG TERM GOAL #3   Title Patient will increase right shoulder strength to 4+/5 to increase ability to complete work related tasks  such as lifting, etc.   Status On-going   OT LONG TERM GOAL #4   Title Patient will decrease fascial restrictions from mod to min amount.    Status On-going   OT LONG TERM GOAL #5   Title Patient will decrease pain level to 3/10 or less during daily tasks.    Status On-going               Plan - 07/11/15 1453    Clinical Impression Statement A: Pt reports that Dr. Veverly Fells was pleased with her progress. Sling is no longer required. Patient is now able to complete Abduction. We are to continue working on increasing AAROM to full range. Close PREs should be strated. Encouraged patient to use one pound hand weights for bicep curls. We are to continue working on increasing IR/ER.    Plan P: Add abduction for pulleys.         Problem List There are no active problems to display for this patient.   Ailene Ravel, OTR/L,CBIS  [phone]  07/11/2015, 2:55 PM  [facility] [address] [place], [place], [address] Phone: [phone]   Fax:  [phone]

## 2015-07-12 ENCOUNTER — Ambulatory Visit (HOSPITAL_COMMUNITY): Payer: 59 | Attending: Orthopedic Surgery

## 2015-07-12 ENCOUNTER — Encounter (HOSPITAL_COMMUNITY): Payer: Self-pay

## 2015-07-12 DIAGNOSIS — R29898 Other symptoms and signs involving the musculoskeletal system: Secondary | ICD-10-CM | POA: Diagnosis not present

## 2015-07-12 DIAGNOSIS — M25611 Stiffness of right shoulder, not elsewhere classified: Secondary | ICD-10-CM | POA: Insufficient documentation

## 2015-07-12 DIAGNOSIS — M25511 Pain in right shoulder: Secondary | ICD-10-CM | POA: Insufficient documentation

## 2015-07-12 DIAGNOSIS — M6289 Other specified disorders of muscle: Secondary | ICD-10-CM

## 2015-07-12 DIAGNOSIS — M629 Disorder of muscle, unspecified: Secondary | ICD-10-CM | POA: Insufficient documentation

## 2015-07-12 NOTE — Therapy (Signed)
[facility] [place], [place], [address] Phone: [phone]   Fax:  [phone]  Occupational Therapy Treatment  Patient Details  Name: Dawn Edwards MRN: [id number] Date of Birth: 09-23-1966 Referring Provider:  Netta Cedars, MD  Encounter Date: 07/12/2015      OT End of Session - 07/12/15 1713    Visit Number 12   Number of Visits 24   Date for OT Re-Evaluation 07/30/15   Authorization Type U[employer]   Authorization Time Period 60 visit limit. Patient has used 0 this year.   Authorization - Visit Number 12   Authorization - Number of Visits 59   OT Start Time 6269   OT Stop Time 1645   OT Time Calculation (min) 42 min   Activity Tolerance Patient tolerated treatment well   Behavior During Therapy WFL for tasks assessed/performed      History reviewed. No pertinent past medical history.  Past Surgical History  Procedure Laterality Date  . Cesarean section      There were no vitals filed for this visit.  Visit Diagnosis:  Shoulder weakness  Tight fascia  Shoulder joint stiffness, right  Pain in joint, shoulder region, right      Subjective Assessment - 07/12/15 1621    Subjective  S: My shoulder was killing last night. I kept waking up every 45 minutes.    Currently in Pain? Yes   Pain Score 3    Pain Location Shoulder   Pain Orientation Right   Pain Descriptors / Indicators Aching   Pain Type Acute pain            [facility] OT Assessment - 07/12/15 1623    Assessment   Diagnosis Right rotator cuff repair   Precautions   Precautions Shoulder   Type of Shoulder Precautions Per MD: Ok to complete Abduction. Work on [employer]. Close PREs. Modalities PRN. Start AROM 07/23/15.                  OT Treatments/Exercises (OP) - 07/12/15 1623    Exercises   Exercises Shoulder   Shoulder Exercises: Supine   Protraction PROM;AAROM;12 reps   Horizontal ABduction PROM;10 reps;AAROM;12 reps   External Rotation PROM;10 reps;AAROM;12 reps   Internal Rotation PROM;10 reps;AAROM;12 reps   Flexion PROM;10 reps;AAROM;12 reps   ABduction PROM;AAROM;12 reps   Shoulder Exercises: Standing   Protraction AAROM;10 reps   Horizontal ABduction AAROM;10 reps   External Rotation AAROM;10 reps   Internal Rotation AAROM;10 reps   Flexion AAROM;10 reps   ABduction AAROM;10 reps   Shoulder Exercises: Pulleys   Flexion 1 minute   ABduction 1 minute   Shoulder Exercises: ROM/Strengthening   Proximal Shoulder Strengthening, Supine 10X no rest breaks   Proximal Shoulder Strengthening, Seated 10X with rest breaks   Manual Therapy   Manual Therapy Myofascial release;Muscle Energy Technique   Myofascial Release Myofascial release to left upper arm, trapezious, and scapularis region to decrease fascial restrictions and increase joint mobility in a pain free zone.    Muscle Energy Technique Muscle energy technique to right anterior deltoid to relax tone and muscle spasm and increase ROM.                  OT Short Term Goals - 07/02/15 1020    OT SHORT TERM GOAL #1   Title Patient will be independent and educated on HEP.   Status On-going   OT SHORT TERM GOAL #2  Title Patient will increase PROM to [facility] to increase ability to get shirts on and off with less difficulty.    Status On-going   OT SHORT TERM GOAL #3   Title Patient will decrease fascial restrctions from max to mod amount.    OT SHORT TERM GOAL #4   Title Patient will decrease pain level to 5/10 with daily tasks.   OT SHORT TERM GOAL #5   Title Patient will increase right shoulder strength to 3/5 to increase ability to complete household tasks at home with less difficulty.    Status On-going           OT Long Term Goals - 06/05/15 1117    OT LONG TERM GOAL #1   Title Patient will return to highest level of independence with all daily, leisure, and work tasks.   Status On-going   OT LONG TERM GOAL #2   Title Patient  will increase Right shoulder AROM to WNL to increase ability to complete overhead tasks as work.    Status On-going   OT LONG TERM GOAL #3   Title Patient will increase right shoulder strength to 4+/5 to increase ability to complete work related tasks such as lifting, etc.   Status On-going   OT LONG TERM GOAL #4   Title Patient will decrease fascial restrictions from mod to min amount.    Status On-going   OT LONG TERM GOAL #5   Title Patient will decrease pain level to 3/10 or less during daily tasks.    Status On-going               Plan - 07/12/15 1714    Clinical Impression Statement A: Completed Abduction with pulleys. patient was able to achieve abduction greater than 90 degrees. Increased tightness and trigger points palpated in right anterior, medial, and posterior deltoid.    Plan P: Increase standing AAROM repetitions.         Problem List There are no active problems to display for this patient.   Ailene Ravel, OTR/L,CBIS  [phone]  07/12/2015, 5:17 PM  [facility] [address] [place], [place], [address] Phone: [phone]   Fax:  [phone]

## 2015-07-17 ENCOUNTER — Encounter (HOSPITAL_COMMUNITY): Payer: 59 | Admitting: Occupational Therapy

## 2015-07-18 ENCOUNTER — Encounter (HOSPITAL_COMMUNITY): Payer: Self-pay

## 2015-07-18 ENCOUNTER — Ambulatory Visit (HOSPITAL_COMMUNITY): Payer: 59

## 2015-07-18 DIAGNOSIS — M6289 Other specified disorders of muscle: Secondary | ICD-10-CM

## 2015-07-18 DIAGNOSIS — M25611 Stiffness of right shoulder, not elsewhere classified: Secondary | ICD-10-CM

## 2015-07-18 DIAGNOSIS — M25511 Pain in right shoulder: Secondary | ICD-10-CM

## 2015-07-18 DIAGNOSIS — R29898 Other symptoms and signs involving the musculoskeletal system: Secondary | ICD-10-CM | POA: Diagnosis not present

## 2015-07-18 DIAGNOSIS — M629 Disorder of muscle, unspecified: Secondary | ICD-10-CM

## 2015-07-18 NOTE — Therapy (Signed)
[place], [place], [address] Phone: [phone]   Fax:  [phone]  Occupational Therapy Treatment  Patient Details  Name: Dawn Edwards MRN: [id number] Date of Birth: July 06, 1966 Referring Provider:  Netta Cedars, MD  Encounter Date: 07/18/2015      OT End of Session - 07/18/15 1143    Visit Number 13   Number of Visits 24   Date for OT Re-Evaluation 07/30/15   Authorization Type U[employer]   Authorization Time Period 60 visit limit. Patient has used 0 this year.   Authorization - Visit Number 13   Authorization - Number of Visits 60   OT Start Time 1100   OT Stop Time 1145   OT Time Calculation (min) 45 min   Activity Tolerance Patient tolerated treatment well   Behavior During Therapy WFL for tasks assessed/performed      History reviewed. No pertinent past medical history.  Past Surgical History  Procedure Laterality Date  . Cesarean section      There were no vitals filed for this visit.  Visit Diagnosis:  Shoulder weakness  Tight fascia  Shoulder joint stiffness, right  Pain in joint, shoulder region, right      Subjective Assessment - 07/18/15 1119    Subjective  S: My shoulder feels pretty good today.   Currently in Pain? Yes   Pain Score 1    Pain Location Shoulder   Pain Orientation Right   Pain Descriptors / Indicators Aching   Pain Type Acute pain            [facility] OT Assessment - 07/18/15 1120    Assessment   Diagnosis Right rotator cuff repair   Precautions   Precautions Shoulder   Type of Shoulder Precautions Per MD: Ok to complete Abduction. Work on [employer]. Close PREs. Modalities PRN. Start AROM 07/23/15.                  OT Treatments/Exercises (OP) - 07/18/15 1120    Exercises   Exercises Shoulder   Shoulder Exercises: Supine   Protraction PROM;5 reps;AAROM;15 reps   Horizontal ABduction PROM;5 reps;AAROM;15 reps   External Rotation PROM;5  reps;AAROM;15 reps   Internal Rotation PROM;5 reps;AAROM;15 reps   Flexion PROM;5 reps;AAROM;15 reps   ABduction PROM;5 reps;AAROM;15 reps   Shoulder Exercises: Standing   Protraction AAROM;20 reps   Horizontal ABduction AAROM;12 reps   External Rotation AAROM;12 reps   Internal Rotation AAROM;12 reps   Flexion AAROM;12 reps   ABduction AAROM;12 reps   Shoulder Exercises: Pulleys   Flexion 1 minute   ABduction 1 minute   Shoulder Exercises: ROM/Strengthening   Wall Wash 1'   Proximal Shoulder Strengthening, Supine 15X with no rest breaks   Proximal Shoulder Strengthening, Seated 15X with no  rest breaks   Manual Therapy   Manual Therapy Myofascial release;Muscle Energy Technique   Myofascial Release Myofascial release to left upper arm, trapezious, and scapularis region to decrease fascial restrictions and increase joint mobility in a pain free zone.    Muscle Energy Technique Muscle energy technique to right anterior deltoid to relax tone and muscle spasm and increase ROM.                  OT Short Term Goals - 07/02/15 1020    OT SHORT TERM GOAL #1   Title Patient will be independent and educated on HEP.   Status On-going   OT SHORT TERM GOAL #  2   Title Patient will increase PROM to [facility] to increase ability to get shirts on and off with less difficulty.    Status On-going   OT SHORT TERM GOAL #3   Title Patient will decrease fascial restrctions from max to mod amount.    OT SHORT TERM GOAL #4   Title Patient will decrease pain level to 5/10 with daily tasks.   OT SHORT TERM GOAL #5   Title Patient will increase right shoulder strength to 3/5 to increase ability to complete household tasks at home with less difficulty.    Status On-going           OT Long Term Goals - 06/05/15 1117    OT LONG TERM GOAL #1   Title Patient will return to highest level of independence with all daily, leisure, and work tasks.   Status On-going   OT LONG TERM GOAL #2   Title  Patient will increase Right shoulder AROM to WNL to increase ability to complete overhead tasks as work.    Status On-going   OT LONG TERM GOAL #3   Title Patient will increase right shoulder strength to 4+/5 to increase ability to complete work related tasks such as lifting, etc.   Status On-going   OT LONG TERM GOAL #4   Title Patient will decrease fascial restrictions from mod to min amount.    Status On-going   OT LONG TERM GOAL #5   Title Patient will decrease pain level to 3/10 or less during daily tasks.    Status On-going               Plan - 07/18/15 1144    Clinical Impression Statement A: Pt sees the MD for follow up on 08/08/15. Increased repetitions for all supine and standing exercises. patient showed an increase in passive range of motion this session.   Plan P: Increase to AROM on 9/15. Continue with AAROM exercises and work on increase passive ROM in all shoulder ranges.        Problem List There are no active problems to display for this patient.   Ailene Ravel, OTR/L,CBIS  [phone]  07/18/2015, 11:50 AM  [place] [address] [place], [place], [address] Phone: [phone]   Fax:  [phone]

## 2015-07-19 ENCOUNTER — Encounter (HOSPITAL_COMMUNITY): Payer: Self-pay

## 2015-07-19 ENCOUNTER — Ambulatory Visit (HOSPITAL_COMMUNITY): Payer: 59

## 2015-07-19 DIAGNOSIS — M25511 Pain in right shoulder: Secondary | ICD-10-CM

## 2015-07-19 DIAGNOSIS — M25611 Stiffness of right shoulder, not elsewhere classified: Secondary | ICD-10-CM

## 2015-07-19 DIAGNOSIS — R29898 Other symptoms and signs involving the musculoskeletal system: Secondary | ICD-10-CM

## 2015-07-19 DIAGNOSIS — M629 Disorder of muscle, unspecified: Secondary | ICD-10-CM

## 2015-07-19 DIAGNOSIS — M6289 Other specified disorders of muscle: Secondary | ICD-10-CM

## 2015-07-19 NOTE — Therapy (Signed)
[place], [place], [address] Phone: [phone]   Fax:  [phone]  Occupational Therapy Treatment  Patient Details  Name: Dawn Edwards MRN: [id number] Date of Birth: August 02, 1966 Referring Provider:  Netta Cedars, MD  Encounter Date: 07/19/2015      OT End of Session - 07/19/15 1034    Visit Number 14   Number of Visits 24   Date for OT Re-Evaluation 07/30/15   Authorization Type UHC   Authorization Time Period 60 visit limit. Patient has used 0 this year.   Authorization - Visit Number 14   Authorization - Number of Visits 60   OT Start Time 0930   OT Stop Time 1015   OT Time Calculation (min) 45 min   Activity Tolerance Patient tolerated treatment well   Behavior During Therapy WFL for tasks assessed/performed      History reviewed. No pertinent past medical history.  Past Surgical History  Procedure Laterality Date  . Cesarean section      There were no vitals filed for this visit.  Visit Diagnosis:  Shoulder weakness  Tight fascia  Pain in joint, shoulder region, right  Shoulder joint stiffness, right      Subjective Assessment - 07/18/15 1119    Subjective  S: My shoulder feels pretty good today.   Currently in Pain? Yes   Pain Score 1    Pain Location Shoulder   Pain Orientation Right   Pain Descriptors / Indicators Aching   Pain Type Acute pain            [facility] OT Assessment - 07/18/15 1120    Assessment   Diagnosis Right rotator cuff repair   Precautions   Precautions Shoulder   Type of Shoulder Precautions Per MD: Ok to complete Abduction. Work on [employer]. Close PREs. Modalities PRN. Start AROM 07/23/15.                  OT Treatments/Exercises (OP) - 07/19/15 0959    Exercises   Exercises Shoulder   Shoulder Exercises: Supine   Protraction PROM;5 reps;AAROM;15 reps   Horizontal ABduction PROM;5 reps;AAROM;15 reps   External Rotation PROM;5  reps;AAROM;15 reps   Internal Rotation PROM;5 reps;AAROM;15 reps   Flexion PROM;5 reps;AAROM;15 reps   ABduction PROM;5 reps;AAROM;15 reps   Other Supine Exercises Serratus anterior punches 15X   Shoulder Exercises: Standing   Protraction AAROM;20 reps   Horizontal ABduction AAROM;12 reps   External Rotation AAROM;12 reps   Internal Rotation AAROM;12 reps   Flexion AAROM;12 reps   ABduction AAROM;12 reps   Shoulder Exercises: Pulleys   Flexion 1 minute   ABduction 1 minute   Shoulder Exercises: ROM/Strengthening   Wall Wash 1'   Proximal Shoulder Strengthening, Supine 15X with no rest breaks   Proximal Shoulder Strengthening, Seated 15X with no  rest breaks   Manual Therapy   Manual Therapy Myofascial release;Muscle Energy Technique   Myofascial Release Myofascial release to left upper arm, trapezious, and scapularis region to decrease fascial restrictions and increase joint mobility in a pain free zone.    Muscle Energy Technique Muscle energy technique to right anterior deltoid to relax tone and muscle spasm and increase ROM.                  OT Short Term Goals - 07/02/15 1020    OT SHORT TERM GOAL #1   Title Patient will be independent and educated on HEP.  Status On-going   OT SHORT TERM GOAL #2   Title Patient will increase PROM to [facility] to increase ability to get shirts on and off with less difficulty.    Status On-going   OT SHORT TERM GOAL #3   Title Patient will decrease fascial restrctions from max to mod amount.    OT SHORT TERM GOAL #4   Title Patient will decrease pain level to 5/10 with daily tasks.   OT SHORT TERM GOAL #5   Title Patient will increase right shoulder strength to 3/5 to increase ability to complete household tasks at home with less difficulty.    Status On-going           OT Long Term Goals - 06/05/15 1117    OT LONG TERM GOAL #1   Title Patient will return to highest level of independence with all daily, leisure, and work tasks.    Status On-going   OT LONG TERM GOAL #2   Title Patient will increase Right shoulder AROM to WNL to increase ability to complete overhead tasks as work.    Status On-going   OT LONG TERM GOAL #3   Title Patient will increase right shoulder strength to 4+/5 to increase ability to complete work related tasks such as lifting, etc.   Status On-going   OT LONG TERM GOAL #4   Title Patient will decrease fascial restrictions from mod to min amount.    Status On-going   OT LONG TERM GOAL #5   Title Patient will decrease pain level to 3/10 or less during daily tasks.    Status On-going               Plan - 07/19/15 1035    Clinical Impression Statement A: Added supine serratus anterior punch to increase scapular strength. Patient completed without pain. Pt continues to show progress with PROM during manual stretching.   Plan P: Increase to AROM on 9/15. Complete protraction, ER/IR supine AROM. Cont with muscle energy technique to increase Rt shoulder ROM.        Problem List There are no active problems to display for this patient.  Ailene Ravel, OTR/L,CBIS  [phone]  07/19/2015, 10:37 AM  [facility] [address] [place], [place], [address] Phone: [phone]   Fax:  [phone]

## 2015-07-24 ENCOUNTER — Ambulatory Visit (HOSPITAL_COMMUNITY): Payer: 59

## 2015-07-24 ENCOUNTER — Encounter (HOSPITAL_COMMUNITY): Payer: Self-pay

## 2015-07-24 DIAGNOSIS — M6289 Other specified disorders of muscle: Secondary | ICD-10-CM

## 2015-07-24 DIAGNOSIS — M25511 Pain in right shoulder: Secondary | ICD-10-CM

## 2015-07-24 DIAGNOSIS — R29898 Other symptoms and signs involving the musculoskeletal system: Secondary | ICD-10-CM

## 2015-07-24 DIAGNOSIS — M629 Disorder of muscle, unspecified: Secondary | ICD-10-CM

## 2015-07-24 DIAGNOSIS — M25611 Stiffness of right shoulder, not elsewhere classified: Secondary | ICD-10-CM

## 2015-07-24 NOTE — Therapy (Signed)
[place], [place], [address] Phone: [phone]   Fax:  [phone]  Occupational Therapy Treatment  Patient Details  Name: Dawn Edwards MRN: [id number] Date of Birth: 06/27/1966 Referring Provider:  Netta Cedars, MD  Encounter Date: 07/24/2015      OT End of Session - 07/24/15 1135    Visit Number 15   Number of Visits 24   Date for OT Re-Evaluation 07/30/15   Authorization Type U[employer]   Authorization Time Period 60 visit limit. Patient has used 0 this year.   Authorization - Visit Number 15   Authorization - Number of Visits 60   OT Start Time 0930   OT Stop Time 1015   OT Time Calculation (min) 45 min   Activity Tolerance Patient tolerated treatment well   Behavior During Therapy WFL for tasks assessed/performed      History reviewed. No pertinent past medical history.  Past Surgical History  Procedure Laterality Date  . Cesarean section      There were no vitals filed for this visit.  Visit Diagnosis:  Shoulder weakness  Tight fascia  Pain in joint, shoulder region, right  Shoulder joint stiffness, right      Subjective Assessment - 07/24/15 0956    Subjective  S: I hurt my shoulder when i was watching football. I threw my arms up in the air and it hurt really bad.    Currently in Pain? Yes   Pain Score 4    Pain Location Shoulder   Pain Orientation Right   Pain Descriptors / Indicators Aching   Pain Type Acute pain            [facility] OT Assessment - 07/24/15 0957    Assessment   Diagnosis Right rotator cuff repair   Precautions   Precautions Shoulder   Type of Shoulder Precautions AROM started on 07/24/15                  OT Treatments/Exercises (OP) - 07/24/15 0957    Exercises   Exercises Shoulder   Shoulder Exercises: Supine   Protraction PROM;5 reps;AROM;10 reps   Horizontal ABduction PROM;5 reps;AROM;10 reps   External Rotation PROM;5 reps;AROM;10 reps    Internal Rotation PROM;5 reps;AROM;10 reps   Flexion PROM;5 reps;AROM;10 reps   ABduction PROM;5 reps;AROM;10 reps   Shoulder Exercises: ROM/Strengthening   Proximal Shoulder Strengthening, Supine 15X with no rest breaks   Manual Therapy   Manual Therapy Myofascial release;Muscle Energy Technique   Myofascial Release Myofascial release to left upper arm, trapezious, and scapularis region to decrease fascial restrictions and increase joint mobility in a pain free zone.    Muscle Energy Technique Muscle energy technique to right anterior deltoid to relax tone and muscle spasm and increase ROM.                  OT Short Term Goals - 07/02/15 1020    OT SHORT TERM GOAL #1   Title Patient will be independent and educated on HEP.   Status On-going   OT SHORT TERM GOAL #2   Title Patient will increase PROM to [facility] to increase ability to get shirts on and off with less difficulty.    Status On-going   OT SHORT TERM GOAL #3   Title Patient will decrease fascial restrctions from max to mod amount.    OT SHORT TERM GOAL #4   Title Patient will decrease pain level to 5/10 with  daily tasks.   OT SHORT TERM GOAL #5   Title Patient will increase right shoulder strength to 3/5 to increase ability to complete household tasks at home with less difficulty.    Status On-going           OT Long Term Goals - 06/05/15 1117    OT LONG TERM GOAL #1   Title Patient will return to highest level of independence with all daily, leisure, and work tasks.   Status On-going   OT LONG TERM GOAL #2   Title Patient will increase Right shoulder AROM to WNL to increase ability to complete overhead tasks as work.    Status On-going   OT LONG TERM GOAL #3   Title Patient will increase right shoulder strength to 4+/5 to increase ability to complete work related tasks such as lifting, etc.   Status On-going   OT LONG TERM GOAL #4   Title Patient will decrease fascial restrictions from mod to min amount.     Status On-going   OT LONG TERM GOAL #5   Title Patient will decrease pain level to 3/10 or less during daily tasks.    Status On-going               Plan - 07/24/15 1150    Clinical Impression Statement A: Progressed to A/ROM this ession. Patient was able to complete exercises supine and standing with good form.    Plan P: mini reassessment. Update HEP. Add UBE.        Problem List There are no active problems to display for this patient.   Ailene Ravel, OTR/L,CBIS  [phone]  07/24/2015, 11:52 AM  [place], [place], [address] Phone: [phone]   Fax:  [phone]

## 2015-07-26 ENCOUNTER — Ambulatory Visit (HOSPITAL_COMMUNITY): Payer: 59

## 2015-07-26 ENCOUNTER — Encounter (HOSPITAL_COMMUNITY): Payer: Self-pay

## 2015-07-26 DIAGNOSIS — R29898 Other symptoms and signs involving the musculoskeletal system: Secondary | ICD-10-CM

## 2015-07-26 DIAGNOSIS — M25611 Stiffness of right shoulder, not elsewhere classified: Secondary | ICD-10-CM

## 2015-07-26 DIAGNOSIS — M25511 Pain in right shoulder: Secondary | ICD-10-CM

## 2015-07-26 DIAGNOSIS — M6289 Other specified disorders of muscle: Secondary | ICD-10-CM

## 2015-07-26 DIAGNOSIS — M629 Disorder of muscle, unspecified: Secondary | ICD-10-CM

## 2015-07-26 NOTE — Therapy (Signed)
[place], [place], [address] Phone: [phone]   Fax:  [phone]  Occupational Therapy Treatment  Patient Details  Name: Dawn Edwards MRN: [id number] Date of Birth: 06-22-1966 Referring Provider:  Netta Cedars, MD  Encounter Date: 07/26/2015      OT End of Session - 07/26/15 1254    Visit Number 16   Number of Visits 24   Date for OT Re-Evaluation 07/30/15   Authorization Type UHC   Authorization Time Period 60 visit limit. Patient has used 0 this year.   Authorization - Visit Number 16   Authorization - Number of Visits 20   OT Start Time 5597   OT Stop Time 1015   OT Time Calculation (min) 40 min   Activity Tolerance Patient tolerated treatment well   Behavior During Therapy WFL for tasks assessed/performed      History reviewed. No pertinent past medical history.  Past Surgical History  Procedure Laterality Date  . Cesarean section      There were no vitals filed for this visit.  Visit Diagnosis:  Tight fascia  Shoulder weakness  Pain in joint, shoulder region, right  Shoulder joint stiffness, right      Subjective Assessment - 07/26/15 1251    Subjective  S: My shoulder is definitely getting better.   Currently in Pain? Yes   Pain Score 2    Pain Location Shoulder   Pain Orientation Right   Pain Descriptors / Indicators Aching   Pain Type Acute pain            [facility] OT Assessment - 07/26/15 0948    Assessment   Diagnosis Right rotator cuff repair   Precautions   Precautions Shoulder   Type of Shoulder Precautions AROM started on 07/24/15   AROM   Overall AROM Comments Assessed standing. ir/ER adducted (A/ROM was not measured previously)   AROM Assessment Site Shoulder   Right/Left Shoulder Right   Right Shoulder Flexion 138 Degrees   Right Shoulder ABduction 120 Degrees   Right Shoulder Internal Rotation 90 Degrees   Right Shoulder External Rotation 46 Degrees    PROM   Overall PROM Comments Assessed supine. ir/ER adducted.   PROM Assessment Site Shoulder   Right/Left Shoulder Right   Right Shoulder Flexion 146 Degrees  previous: 146   Right Shoulder ABduction 132 Degrees  previous: unable to measure due to MD protocol.   Right Shoulder Internal Rotation 90 Degrees  previous: 90   Right Shoulder External Rotation 44 Degrees  previous: 28   Strength   Overall Strength Unable to assess;Due to precautions                  OT Treatments/Exercises (OP) - 07/26/15 0958    Exercises   Exercises Shoulder   Shoulder Exercises: Supine   Protraction PROM;5 reps   Horizontal ABduction PROM;5 reps   External Rotation PROM;5 reps   Internal Rotation PROM;5 reps   Flexion PROM;5 reps   ABduction PROM;5 reps   Shoulder Exercises: Standing   Protraction AROM;10 reps   Horizontal ABduction AROM;10 reps   External Rotation AROM;10 reps   Internal Rotation AROM;10 reps   Flexion AROM;10 reps   ABduction AROM;10 reps   Shoulder Exercises: ROM/Strengthening   UBE (Upper Arm Bike) Level 1 2' forward 2' reverse   X to V Arms 10X   Manual Therapy   Manual Therapy Myofascial release;Muscle Energy Technique   Myofascial Release  Myofascial release to left upper arm, trapezious, and scapularis region to decrease fascial restrictions and increase joint mobility in a pain free zone.    Muscle Energy Technique Muscle energy technique to right anterior deltoid to relax tone and muscle spasm and increase ROM.                OT Education - 07/26/15 1254    Education provided Yes   Education Details AROM exercises   Person(s) Educated Patient   Methods Explanation;Demonstration;Handout   Comprehension Verbalized understanding;Returned demonstration          OT Short Term Goals - 07/26/15 1001    OT SHORT TERM GOAL #1   Title Patient will be independent and educated on HEP.   Status On-going   OT SHORT TERM GOAL #2   Title Patient  will increase P/ROM to [facility] to increase ability to get shirts on and off with less difficulty.    Status Achieved   OT SHORT TERM GOAL #3   Title Patient will decrease fascial restrctions from max to mod amount.    OT SHORT TERM GOAL #4   Title Patient will decrease pain level to 5/10 with daily tasks.   OT SHORT TERM GOAL #5   Title Patient will increase right shoulder strength to 3/5 to increase ability to complete household tasks at home with less difficulty.    Status Achieved           OT Long Term Goals - 07/26/15 1002    OT LONG TERM GOAL #1   Title Patient will return to highest level of independence with all daily, leisure, and work tasks.   Status On-going   OT LONG TERM GOAL #2   Title Patient will increase Right shoulder A/ROM to WNL to increase ability to complete overhead tasks as work.    Status On-going   OT LONG TERM GOAL #3   Title Patient will increase right shoulder strength to 4+/5 to increase ability to complete work related tasks such as lifting, etc.   Status On-going   OT LONG TERM GOAL #4   Title Patient will decrease fascial restrictions from mod to min amount.    Status On-going   OT LONG TERM GOAL #5   Title Patient will decrease pain level to 3/10 or less during daily tasks.    Status On-going               Plan - 07/26/15 1255    Clinical Impression Statement A: Mini reassessment completed this date. patient me all STGs and is progressing towards LTGs. Patient reports that she is able to do more with her RUE although she continues to have deficits with strength and ROM. HEP was updated and we added UBE bike.    Plan P: Add overhead lacing.         Problem List There are no active problems to display for this patient.   Ailene Ravel, OTR/L,CBIS  [phone]  07/26/2015, 12:59 PM  [facility] [address] [place], [place], [address] Phone: [phone]   Fax:  [phone]

## 2015-07-26 NOTE — Patient Instructions (Signed)
1) Shoulder Protraction    Begin with elbows by your side, slowly "punch" straight out in front of you. Repeat _10-12__times, ____set/day.     2) Shoulder Flexion  Supine:     Standing:         Begin with arms at your side with thumbs pointed up, slowly raise both arms up and forward towards overhead. Repeat_10-12__times, ___set/day.      3) Horizontal abduction/adduction  Supine:   Standing:           Begin with arms straight out in front of you, bring out to the side in at "T" shape. Keep arms straight entire time. Repeat _10-12___times, ____sets/day.                 4) Internal & External Rotation    *No band* -Stand with elbows at the side and elbows bent 90 degrees. Move your forearms away from your body, then bring back inward toward the body.  Repeat _10-12__times, ___sets/day    5) Shoulder Abduction  Supine:     Standing:       Lying on your back begin with your arms flat on the table next to your side. Slowly move your arms out to the side so that they go overhead, in a jumping jack or snow angel movement. Repeat _10-12__times, ___sets/day

## 2015-07-31 ENCOUNTER — Ambulatory Visit (HOSPITAL_COMMUNITY): Payer: 59

## 2015-07-31 ENCOUNTER — Encounter (HOSPITAL_COMMUNITY): Payer: Self-pay

## 2015-07-31 DIAGNOSIS — R29898 Other symptoms and signs involving the musculoskeletal system: Secondary | ICD-10-CM | POA: Diagnosis not present

## 2015-07-31 DIAGNOSIS — M25611 Stiffness of right shoulder, not elsewhere classified: Secondary | ICD-10-CM

## 2015-07-31 DIAGNOSIS — M25511 Pain in right shoulder: Secondary | ICD-10-CM

## 2015-07-31 DIAGNOSIS — M629 Disorder of muscle, unspecified: Secondary | ICD-10-CM

## 2015-07-31 DIAGNOSIS — M6289 Other specified disorders of muscle: Secondary | ICD-10-CM

## 2015-07-31 NOTE — Therapy (Signed)
[place], [place], [address] Phone: [phone]   Fax:  [phone]  Occupational Therapy Treatment  Patient Details  Name: Dawn Edwards MRN: [id number] Date of Birth: [birth year] Referring Provider:  Netta Cedars, MD  Encounter Date: 07/31/2015      OT End of Session - 07/31/15 1011    Visit Number 17   Number of Visits 24   Date for OT Re-Evaluation 09/29/15  mini reassess: 08/28/15   Authorization Type U[employer]   Authorization Time Period 60 visit limit. Patient has used 0 this year.   Authorization - Visit Number 17   Authorization - Number of Visits 5   OT Start Time 7902   OT Stop Time 1015   OT Time Calculation (min) 40 min   Activity Tolerance Patient tolerated treatment well   Behavior During Therapy WFL for tasks assessed/performed      History reviewed. No pertinent past medical history.  Past Surgical History  Procedure Laterality Date  . Cesarean section      There were no vitals filed for this visit.  Visit Diagnosis:  Tight fascia - Plan: Ot plan of care cert/re-cert  Shoulder weakness - Plan: Ot plan of care cert/re-cert  Pain in joint, shoulder region, right - Plan: Ot plan of care cert/re-cert  Shoulder joint stiffness, right - Plan: Ot plan of care cert/re-cert      Subjective Assessment - 07/31/15 0956    Subjective  S: I'm using my arm more now.    Currently in Pain? Yes   Pain Score 1    Pain Location Shoulder   Pain Orientation Right   Pain Descriptors / Indicators Aching   Pain Type Acute pain            [facility] OT Assessment - 07/31/15 0957    Assessment   Diagnosis Right rotator cuff repair   Precautions   Precautions Shoulder   Type of Shoulder Precautions AROM started on 07/24/15   AROM   Overall AROM Comments Assessed standing. ir/ER adducted (A/ROM was not measured previously)   Right Shoulder Flexion 138 Degrees   Right Shoulder ABduction 120 Degrees   Right Shoulder Internal Rotation 90 Degrees   Right Shoulder External Rotation 46 Degrees   PROM   Overall PROM Comments Assessed supine. ir/ER adducted.   Right Shoulder Flexion 146 Degrees  previous: 146   Strength   Overall Strength Unable to assess;Due to precautions                  OT Treatments/Exercises (OP) - 07/31/15 0957    Exercises   Exercises Shoulder   Shoulder Exercises: Supine   Protraction PROM;5 reps;AROM;12 reps   Horizontal ABduction PROM;5 reps;AROM;12 reps   External Rotation PROM;5 reps;AROM;12 reps   Internal Rotation PROM;5 reps;AROM;12 reps   Flexion PROM;5 reps;AROM;12 reps   ABduction PROM;5 reps;AROM;12 reps   Shoulder Exercises: Standing   Protraction AROM;12 reps   Horizontal ABduction AROM;12 reps   External Rotation AROM;12 reps   Internal Rotation AROM;12 reps   Flexion AROM;12 reps   ABduction AROM;12 reps   Shoulder Exercises: ROM/Strengthening   UBE (Upper Arm Bike) Level 1 3' forward 3' reverse   X to V Arms 12X   Proximal Shoulder Strengthening, Supine 15X with no rest breaks   Proximal Shoulder Strengthening, Seated 15X with no  rest breaks   Manual Therapy   Manual Therapy Myofascial release;Muscle Energy Technique   Myofascial  Release Myofascial release to left upper arm, trapezious, and scapularis region to decrease fascial restrictions and increase joint mobility in a pain free zone.    Muscle Energy Technique Muscle energy technique to right anterior deltoid to relax tone and muscle spasm and increase ROM.                  OT Short Term Goals - 07/31/15 1020    OT SHORT TERM GOAL #1   Title Patient will be independent and educated on HEP.   Status On-going   OT SHORT TERM GOAL #2   Title Patient will increase P/ROM to [facility] to increase ability to get shirts on and off with less difficulty.    OT SHORT TERM GOAL #3   Title Patient will decrease fascial restrctions from max to mod amount.    OT SHORT TERM  GOAL #4   Title Patient will decrease pain level to 5/10 with daily tasks.   OT SHORT TERM GOAL #5   Title Patient will increase right shoulder strength to 3/5 to increase ability to complete household tasks at home with less difficulty.            OT Long Term Goals - 07/26/15 1002    OT LONG TERM GOAL #1   Title Patient will return to highest level of independence with all daily, leisure, and work tasks.   Status On-going   OT LONG TERM GOAL #2   Title Patient will increase Right shoulder A/ROM to WNL to increase ability to complete overhead tasks as work.    Status On-going   OT LONG TERM GOAL #3   Title Patient will increase right shoulder strength to 4+/5 to increase ability to complete work related tasks such as lifting, etc.   Status On-going   OT LONG TERM GOAL #4   Title Patient will decrease fascial restrictions from mod to min amount.    Status On-going   OT LONG TERM GOAL #5   Title Patient will decrease pain level to 3/10 or less during daily tasks.    Status On-going               Plan - 07/31/15 1015    Clinical Impression Statement A: Re-Certification sent to Dr. Veverly Fells today as patient was measured at last session. Added overhead lacing and increased time with UBE bike. Patient did all exercises well and increased repetitions. Slight muscle fatigue as patient progressed through session.   Plan P: Add ball on the wall.         Problem List There are no active problems to display for this patient.  Ailene Ravel, OTR/L,CBIS  [phone]  07/31/2015, 10:24 AM  [place] [address] [place], [place], [address] Phone: [phone]   Fax:  [phone]

## 2015-08-02 ENCOUNTER — Ambulatory Visit (HOSPITAL_COMMUNITY): Payer: 59

## 2015-08-02 ENCOUNTER — Encounter (HOSPITAL_COMMUNITY): Payer: Self-pay

## 2015-08-02 DIAGNOSIS — R29898 Other symptoms and signs involving the musculoskeletal system: Secondary | ICD-10-CM | POA: Diagnosis not present

## 2015-08-02 DIAGNOSIS — M25511 Pain in right shoulder: Secondary | ICD-10-CM

## 2015-08-02 DIAGNOSIS — M6289 Other specified disorders of muscle: Secondary | ICD-10-CM

## 2015-08-02 DIAGNOSIS — M629 Disorder of muscle, unspecified: Secondary | ICD-10-CM

## 2015-08-02 DIAGNOSIS — M25611 Stiffness of right shoulder, not elsewhere classified: Secondary | ICD-10-CM

## 2015-08-02 NOTE — Therapy (Signed)
[place], [place], [address] Phone: [phone]   Fax:  [phone]  Occupational Therapy Treatment  Patient Details  Name: Dawn Edwards MRN: [id number] Date of Birth: 05-16-1966 Referring Provider:  Cory Munch, PA-C  Encounter Date: 08/02/2015      OT End of Session - 08/02/15 1223    Visit Number 18   Number of Visits 24   Date for OT Re-Evaluation 09/29/15  mini reassess: 08/28/15   Authorization Type UHC   Authorization Time Period 60 visit limit. Patient has used 0 this year.   Authorization - Visit Number 18   Authorization - Number of Visits 60   OT Start Time 0930   OT Stop Time 1015   OT Time Calculation (min) 45 min   Activity Tolerance Patient tolerated treatment well   Behavior During Therapy WFL for tasks assessed/performed      History reviewed. No pertinent past medical history.  Past Surgical History  Procedure Laterality Date  . Cesarean section      There were no vitals filed for this visit.  Visit Diagnosis:  Tight fascia  Shoulder weakness  Pain in joint, shoulder region, right  Shoulder joint stiffness, right      Subjective Assessment - 08/02/15 1215    Subjective  S: I'm sore today because I shoved the dog off the couch    Currently in Pain? Yes   Pain Score 4    Pain Location Shoulder   Pain Orientation Right   Pain Descriptors / Indicators Aching   Pain Type Acute pain            [facility] OT Assessment - 08/02/15 1222    Assessment   Diagnosis --   Precautions   Precautions --   Type of Shoulder Precautions --                  OT Treatments/Exercises (OP) - 08/02/15 0951    Exercises   Exercises Shoulder   Shoulder Exercises: Supine   Protraction PROM;5 reps;AROM;12 reps   Horizontal ABduction PROM;5 reps;AROM;12 reps   External Rotation PROM;5 reps;AROM;12 reps   Internal Rotation PROM;5 reps;AROM;12 reps   Flexion PROM;5  reps;AROM;12 reps   ABduction PROM;5 reps;AROM  6X completed for A/ROM due to pain   Shoulder Exercises: ROM/Strengthening   Proximal Shoulder Strengthening, Supine 15X with no rest breaks   Other ROM/Strengthening Exercises sliding hand up pole fixed on bed and corner of room into flexed position while rotating body to left 10 times.   Other ROM/Strengthening Exercises standing completed body/arm swinging activity from side to side (opposite arm to opposite leg) 15 times slowly and 15 times with increased momentum, and then completed again swinging arms upwards in PNF patterns 15 times with moderate force.   Shoulder Exercises: Stretch   Corner Stretch 3 reps;10 seconds   Cross Chest Stretch 3 reps;10 seconds   Wall Stretch - Flexion 3 reps;10 seconds   Wall Stretch - ABduction 3 reps;10 seconds   Manual Therapy   Manual Therapy Myofascial release;Muscle Energy Technique   Myofascial Release Myofascial release to left upper arm, trapezious, and scapularis region to decrease fascial restrictions and increase joint mobility in a pain free zone.    Muscle Energy Technique Muscle energy technique to right anterior deltoid to relax tone and muscle spasm and increase ROM.  OT Short Term Goals - 07/31/15 1020    OT SHORT TERM GOAL #1   Title Patient will be independent and educated on HEP.   Status On-going   OT SHORT TERM GOAL #2   Title Patient will increase P/ROM to [facility] to increase ability to get shirts on and off with less difficulty.    OT SHORT TERM GOAL #3   Title Patient will decrease fascial restrctions from max to mod amount.    OT SHORT TERM GOAL #4   Title Patient will decrease pain level to 5/10 with daily tasks.   OT SHORT TERM GOAL #5   Title Patient will increase right shoulder strength to 3/5 to increase ability to complete household tasks at home with less difficulty.            OT Long Term Goals - 07/26/15 1002    OT LONG TERM GOAL #1    Title Patient will return to highest level of independence with all daily, leisure, and work tasks.   Status On-going   OT LONG TERM GOAL #2   Title Patient will increase Right shoulder A/ROM to WNL to increase ability to complete overhead tasks as work.    Status On-going   OT LONG TERM GOAL #3   Title Patient will increase right shoulder strength to 4+/5 to increase ability to complete work related tasks such as lifting, etc.   Status On-going   OT LONG TERM GOAL #4   Title Patient will decrease fascial restrictions from mod to min amount.    Status On-going   OT LONG TERM GOAL #5   Title Patient will decrease pain level to 3/10 or less during daily tasks.    Status On-going               Plan - 08/02/15 1224    Clinical Impression Statement A: Did not add ball on the wall due to increased pain and tightness during session. Added unwinding exercises to help relax the right shoulder. Added shoulder stretches to also release tight shoulder muscles.    Plan P: Attempt ball on the wall .         Problem List There are no active problems to display for this patient.   Ailene Ravel, OTR/L,CBIS  [phone]  08/02/2015, 12:32 PM  [facility] [address] [place], [place], [address] Phone: [phone]   Fax:  [phone]

## 2015-08-07 ENCOUNTER — Ambulatory Visit (HOSPITAL_COMMUNITY): Payer: 59 | Admitting: Specialist

## 2015-08-07 DIAGNOSIS — R29898 Other symptoms and signs involving the musculoskeletal system: Secondary | ICD-10-CM

## 2015-08-07 DIAGNOSIS — M629 Disorder of muscle, unspecified: Secondary | ICD-10-CM

## 2015-08-07 DIAGNOSIS — M25511 Pain in right shoulder: Secondary | ICD-10-CM

## 2015-08-07 DIAGNOSIS — M6289 Other specified disorders of muscle: Secondary | ICD-10-CM

## 2015-08-07 NOTE — Therapy (Signed)
[place], [place], [address] Phone: [phone]   Fax:  [phone]  Occupational Therapy Treatment  Patient Details  Name: Dawn Edwards MRN: [id number] Date of Birth: 12-19-1965 Referring Provider:  Netta Cedars, MD  Encounter Date: 08/07/2015      OT End of Session - 08/07/15 1042    Visit Number 19   Number of Visits 24   Date for OT Re-Evaluation 09/29/15  mini reassess on 10/18   Authorization Type UHC   Authorization Time Period 60 visit limit. Patient has used 0 this year.   Authorization - Visit Number 19   Authorization - Number of Visits 41   OT Start Time 8325   OT Stop Time 1015   OT Time Calculation (min) 40 min   Activity Tolerance Patient tolerated treatment well   Behavior During Therapy WFL for tasks assessed/performed      No past medical history on file.  Past Surgical History  Procedure Laterality Date  . Cesarean section      There were no vitals filed for this visit.  Visit Diagnosis:  Tight fascia  Shoulder weakness  Pain in joint, shoulder region, right      Subjective Assessment - 08/07/15 0937    Subjective  S:  It has been really sore since thursday.  Today is the first day it has felt ok.   Limitations Protocol per MD: AAROM, increase gentle ROM, isometric exercises. NO abduction.   Currently in Pain? Yes   Pain Score 2    Pain Location Shoulder   Pain Orientation Right   Pain Descriptors / Indicators Aching   Pain Type Acute pain                      OT Treatments/Exercises (OP) - 08/07/15 0001    Exercises   Exercises Shoulder   Shoulder Exercises: Supine   Protraction PROM;5 reps;AROM;15 reps   Horizontal ABduction PROM;5 reps;AROM;15 reps   External Rotation PROM;5 reps;AROM;15 reps   Internal Rotation PROM;5 reps;AROM;15 reps   Flexion PROM;5 reps;AROM;15 reps   ABduction PROM;5 reps;AROM;15 reps   ABduction Limitations with elbows  flexed to 90, stopped at 90 abduction   Shoulder Exercises: Standing   External Rotation Theraband;15 reps   Theraband Level (Shoulder External Rotation) Level 2 (Red)   Internal Rotation Theraband;15 reps   Theraband Level (Shoulder Internal Rotation) Level 2 (Red)   Extension Theraband;15 reps   Theraband Level (Shoulder Extension) Level 2 (Red)   Row Theraband;15 reps   Theraband Level (Shoulder Row) Level 2 (Red)   Retraction Theraband;15 reps   Theraband Level (Shoulder Retraction) Level 2 (Red)   Shoulder Exercises: ROM/Strengthening   Ball on Wall 1 minute with arm flexed to 90   Other ROM/Strengthening Exercises sliding hand up pole fixed on bed and corner of room into flexed position while rotating body to left 15 times.   Other ROM/Strengthening Exercises standing completed body/arm swinging activity from side to side (opposite arm to opposite leg) 15 times slowly and 15 times with increased momentum, and then completed again swinging arms upwards in PNF patterns 15 times with moderate force.   Manual Therapy   Manual Therapy Myofascial release   Myofascial Release Myofascial release to left upper arm, trapezious, and scapularis region to decrease fascial restrictions and increase joint mobility in a pain free zone.  OT Short Term Goals - 07/31/15 1020    OT SHORT TERM GOAL #1   Title Patient will be independent and educated on HEP.   Status On-going   OT SHORT TERM GOAL #2   Title Patient will increase P/ROM to [facility] to increase ability to get shirts on and off with less difficulty.    OT SHORT TERM GOAL #3   Title Patient will decrease fascial restrctions from max to mod amount.    OT SHORT TERM GOAL #4   Title Patient will decrease pain level to 5/10 with daily tasks.   OT SHORT TERM GOAL #5   Title Patient will increase right shoulder strength to 3/5 to increase ability to complete household tasks at home with less difficulty.             OT Long Term Goals - 07/26/15 1002    OT LONG TERM GOAL #1   Title Patient will return to highest level of independence with all daily, leisure, and work tasks.   Status On-going   OT LONG TERM GOAL #2   Title Patient will increase Right shoulder A/ROM to WNL to increase ability to complete overhead tasks as work.    Status On-going   OT LONG TERM GOAL #3   Title Patient will increase right shoulder strength to 4+/5 to increase ability to complete work related tasks such as lifting, etc.   Status On-going   OT LONG TERM GOAL #4   Title Patient will decrease fascial restrictions from mod to min amount.    Status On-going   OT LONG TERM GOAL #5   Title Patient will decrease pain level to 3/10 or less during daily tasks.    Status On-going               Plan - 08/07/15 1043    Clinical Impression Statement A:  Patient had increased pain after manual therapy last session.  Good range and stretch with PVC pipe exercise.  Added tband exercises for scapular stability.    Plan P: Increase repetitions of theraband exercises, attempt IFES for pain control.        Problem List There are no active problems to display for this patient.  Vangie Bicker, OTR/L [phone]  08/07/2015, 10:47 AM  [place], [place], [address] Phone: [phone]   Fax:  [phone]   v

## 2015-08-09 ENCOUNTER — Encounter (HOSPITAL_COMMUNITY): Payer: Self-pay

## 2015-08-09 ENCOUNTER — Ambulatory Visit (HOSPITAL_COMMUNITY): Payer: 59

## 2015-08-09 DIAGNOSIS — M25611 Stiffness of right shoulder, not elsewhere classified: Secondary | ICD-10-CM

## 2015-08-09 DIAGNOSIS — M25511 Pain in right shoulder: Secondary | ICD-10-CM

## 2015-08-09 DIAGNOSIS — R29898 Other symptoms and signs involving the musculoskeletal system: Secondary | ICD-10-CM

## 2015-08-09 DIAGNOSIS — M6289 Other specified disorders of muscle: Secondary | ICD-10-CM

## 2015-08-09 DIAGNOSIS — M629 Disorder of muscle, unspecified: Secondary | ICD-10-CM

## 2015-08-09 NOTE — Therapy (Signed)
[place], [place], [address] Phone: [phone]   Fax:  [phone]  Occupational Therapy Treatment  Patient Details  Name: Dawn Edwards MRN: [id number] Date of Birth: 05-04-1966 Referring Seydou Hearns:  Netta Cedars, MD  Encounter Date: 08/09/2015      OT End of Session - 08/09/15 1017    Visit Number 20   Number of Visits 24   Date for OT Re-Evaluation 09/29/15  mini reassess on 10/18   Authorization Type U[employer]   Authorization Time Period 60 visit limit. Patient has used 0 this year.   Authorization - Visit Number 20   Authorization - Number of Visits 98   OT Start Time 6962   OT Stop Time 1015   OT Time Calculation (min) 40 min   Activity Tolerance Patient tolerated treatment well   Behavior During Therapy [facility] for tasks assessed/performed      History reviewed. No pertinent past medical history.  Past Surgical History  Procedure Laterality Date  . Cesarean section      There were no vitals filed for this visit.  Visit Diagnosis:  Tight fascia  Shoulder weakness  Pain in joint, shoulder region, right  Shoulder joint stiffness, right      Subjective Assessment - 08/09/15 1003    Limitations Protocol per MD: Begin strengthening. OK to complete in all ranges.    Currently in Pain? Yes   Pain Score 1    Pain Location Shoulder   Pain Orientation Right   Pain Descriptors / Indicators Aching   Pain Type Acute pain            [facility] OT Assessment - 08/09/15 1003    Assessment   Diagnosis Right rotator cuff repair   Precautions   Precautions Shoulder   Type of Shoulder Precautions Begin strengthening. Progress as tolerated.                  OT Treatments/Exercises (OP) - 08/09/15 1004    Exercises   Exercises Shoulder   Shoulder Exercises: Supine   Protraction PROM;5 reps;Strengthening;10 reps   Protraction Weight (lbs) 1   Horizontal ABduction PROM;5 reps;Strengthening;10  reps   Horizontal ABduction Weight (lbs) 1   External Rotation PROM;5 reps;Strengthening;10 reps   External Rotation Weight (lbs) 1   Internal Rotation PROM;5 reps;Strengthening;10 reps   Internal Rotation Weight (lbs) 1   Flexion PROM;5 reps;Strengthening;10 reps   Shoulder Flexion Weight (lbs) 1   ABduction PROM;5 reps;Strengthening;10 reps   Shoulder ABduction Weight (lbs) 1   Shoulder Exercises: ROM/Strengthening   Over Head Lace 1'   X to V Arms 10X with 1#   Proximal Shoulder Strengthening, Supine 10X with 1# no rest breaks   Proximal Shoulder Strengthening, Seated 10X with 1# no rest breaks   Ball on Wall 1' flexion 1' abduction with green ball   Manual Therapy   Manual Therapy Myofascial release   Myofascial Release Myofascial release to left upper arm, trapezious, and scapularis region to decrease fascial restrictions and increase joint mobility in a pain free zone.                 OT Education - 08/09/15 1039    Education provided Yes   Education Details Recommended completing strengthening exercises using 1# or water bottle or can of soup.   Person(s) Educated Patient   Methods Explanation;Demonstration   Comprehension Returned demonstration;Verbalized understanding          OT  Short Term Goals - 07/31/15 1020    OT SHORT TERM GOAL #1   Title Patient will be independent and educated on HEP.   Status On-going   OT SHORT TERM GOAL #2   Title Patient will increase P/ROM to [facility] to increase ability to get shirts on and off with less difficulty.    OT SHORT TERM GOAL #3   Title Patient will decrease fascial restrctions from max to mod amount.    OT SHORT TERM GOAL #4   Title Patient will decrease pain level to 5/10 with daily tasks.   OT SHORT TERM GOAL #5   Title Patient will increase right shoulder strength to 3/5 to increase ability to complete household tasks at home with less difficulty.            OT Long Term Goals - 07/26/15 1002    OT LONG  TERM GOAL #1   Title Patient will return to highest level of independence with all daily, leisure, and work tasks.   Status On-going   OT LONG TERM GOAL #2   Title Patient will increase Right shoulder A/ROM to WNL to increase ability to complete overhead tasks as work.    Status On-going   OT LONG TERM GOAL #3   Title Patient will increase right shoulder strength to 4+/5 to increase ability to complete work related tasks such as lifting, etc.   Status On-going   OT LONG TERM GOAL #4   Title Patient will decrease fascial restrictions from mod to min amount.    Status On-going   OT LONG TERM GOAL #5   Title Patient will decrease pain level to 3/10 or less during daily tasks.    Status On-going               Plan - 08/09/15 1018    Clinical Impression Statement A: Pain was very low at beginning of session. Pt states that MD was happy with progress in therapy and ok'd to start strengthening. Added 1# hand weight to standing and supine exercises. Pt tolerated well with some pain and fatigue. Pt tolerated well overall.   Plan P: Increase reptitions of theraband exercises.        Problem List There are no active problems to display for this patient.   Ailene Ravel, OTR/L,CBIS  [phone]  08/09/2015, 10:57 AM  [place], [place], [address] Phone: [phone]   Fax:  [phone]

## 2015-08-14 ENCOUNTER — Ambulatory Visit (HOSPITAL_COMMUNITY): Payer: 59 | Attending: Orthopedic Surgery

## 2015-08-14 ENCOUNTER — Encounter (HOSPITAL_COMMUNITY): Payer: Self-pay

## 2015-08-14 DIAGNOSIS — R29898 Other symptoms and signs involving the musculoskeletal system: Secondary | ICD-10-CM | POA: Diagnosis present

## 2015-08-14 DIAGNOSIS — M542 Cervicalgia: Secondary | ICD-10-CM | POA: Diagnosis present

## 2015-08-14 DIAGNOSIS — M629 Disorder of muscle, unspecified: Secondary | ICD-10-CM | POA: Insufficient documentation

## 2015-08-14 DIAGNOSIS — M25511 Pain in right shoulder: Secondary | ICD-10-CM | POA: Insufficient documentation

## 2015-08-14 DIAGNOSIS — M6289 Other specified disorders of muscle: Secondary | ICD-10-CM

## 2015-08-14 DIAGNOSIS — M25611 Stiffness of right shoulder, not elsewhere classified: Secondary | ICD-10-CM | POA: Insufficient documentation

## 2015-08-14 NOTE — Therapy (Signed)
[place], [place], [address] Phone: [phone]   Fax:  [phone]  Occupational Therapy Treatment  Patient Details  Name: Dawn Edwards MRN: [id number] Date of Birth: [birth year] Referring Provider:  Netta Cedars, MD  Encounter Date: 08/14/2015      OT End of Session - 08/14/15 1040    Visit Number 21   Number of Visits 24   Date for OT Re-Evaluation 09/29/15  mini reassess on 10/18   Authorization Type U[employer]   Authorization Time Period 60 visit limit. Patient has used 0 this year.   Authorization - Visit Number 21   Authorization - Number of Visits 60   OT Start Time 0930   OT Stop Time 1015   OT Time Calculation (min) 45 min   Activity Tolerance Patient tolerated treatment well   Behavior During Therapy WFL for tasks assessed/performed      History reviewed. No pertinent past medical history.  Past Surgical History  Procedure Laterality Date  . Cesarean section      There were no vitals filed for this visit.  Visit Diagnosis:  Tight fascia  Shoulder weakness  Pain in joint, shoulder region, right  Shoulder joint stiffness, right      Subjective Assessment - 08/14/15 1002    Subjective  S: I have to go back to work on Thursday.    Currently in Pain? Yes   Pain Score 1    Pain Location Shoulder   Pain Orientation Right   Pain Descriptors / Indicators Aching   Pain Type Acute pain            [facility] OT Assessment - 08/14/15 1004    Assessment   Diagnosis Right rotator cuff repair   Precautions   Precautions Shoulder   Type of Shoulder Precautions Begin strengthening. Progress as tolerated.                  OT Treatments/Exercises (OP) - 08/14/15 1004    Exercises   Exercises Shoulder   Shoulder Exercises: Supine   Protraction PROM;5 reps;Strengthening;10 reps   Protraction Weight (lbs) 1   Horizontal ABduction PROM;5 reps;Strengthening;10 reps   Horizontal  ABduction Weight (lbs) 1   External Rotation PROM;5 reps;Strengthening;10 reps   External Rotation Weight (lbs) 1   Internal Rotation PROM;5 reps;Strengthening;10 reps   Internal Rotation Weight (lbs) 1   Flexion PROM;5 reps;Strengthening;10 reps   Shoulder Flexion Weight (lbs) 1   ABduction PROM;5 reps;Strengthening;10 reps   Shoulder ABduction Weight (lbs) 1   Shoulder Exercises: Prone   Other Prone Exercises Hughston exercises 10X   Other Prone Exercises Scapular raises 10X   Shoulder Exercises: Standing   Extension Theraband;10 reps   Theraband Level (Shoulder Extension) Level 3 (Green)   Row [employer] reps   Theraband Level (Shoulder Row) Level 3 (Green)   Retraction Theraband;10 reps   Theraband Level (Shoulder Retraction) Level 3 (Green)   Manual Therapy   Manual Therapy Myofascial release   Myofascial Release Myofascial release to left upper arm, trapezious, and scapularis region to decrease fascial restrictions and increase joint mobility in a pain free zone.    Muscle Energy Technique Muscle energy technique to right anterior deltoid to relax tone and muscle spasm and increase ROM.                  OT Short Term Goals - 07/31/15 1020    OT SHORT TERM GOAL #1   Title  Patient will be independent and educated on HEP.   Status On-going   OT SHORT TERM GOAL #2   Title Patient will increase P/ROM to [facility] to increase ability to get shirts on and off with less difficulty.    OT SHORT TERM GOAL #3   Title Patient will decrease fascial restrctions from max to mod amount.    OT SHORT TERM GOAL #4   Title Patient will decrease pain level to 5/10 with daily tasks.   OT SHORT TERM GOAL #5   Title Patient will increase right shoulder strength to 3/5 to increase ability to complete household tasks at home with less difficulty.            OT Long Term Goals - 07/26/15 1002    OT LONG TERM GOAL #1   Title Patient will return to highest level of independence with all  daily, leisure, and work tasks.   Status On-going   OT LONG TERM GOAL #2   Title Patient will increase Right shoulder A/ROM to WNL to increase ability to complete overhead tasks as work.    Status On-going   OT LONG TERM GOAL #3   Title Patient will increase right shoulder strength to 4+/5 to increase ability to complete work related tasks such as lifting, etc.   Status On-going   OT LONG TERM GOAL #4   Title Patient will decrease fascial restrictions from mod to min amount.    Status On-going   OT LONG TERM GOAL #5   Title Patient will decrease pain level to 3/10 or less during daily tasks.    Status On-going               Plan - 08/14/15 1045    Clinical Impression Statement A: Pt reports that she may have did too much housework yesterday as she was sore at the end of the day. Increased to green theraband exercises. Patient only required intial cueing for body posture and form. Added prone exercises to focus on scapulaer strengthening.   Plan P: Add cybex row and press.        Problem List There are no active problems to display for this patient.   Ailene Ravel, OTR/L,CBIS  [phone]  08/14/2015, 10:49 AM  [place] [address] [place], [place], [address] Phone: [phone]   Fax:  [phone]

## 2015-08-16 ENCOUNTER — Encounter (HOSPITAL_COMMUNITY): Payer: 59

## 2015-08-16 ENCOUNTER — Encounter (HOSPITAL_COMMUNITY): Payer: Self-pay

## 2015-08-16 ENCOUNTER — Ambulatory Visit (HOSPITAL_COMMUNITY): Payer: 59

## 2015-08-16 DIAGNOSIS — M629 Disorder of muscle, unspecified: Secondary | ICD-10-CM | POA: Diagnosis not present

## 2015-08-16 DIAGNOSIS — M25611 Stiffness of right shoulder, not elsewhere classified: Secondary | ICD-10-CM

## 2015-08-16 DIAGNOSIS — M6289 Other specified disorders of muscle: Secondary | ICD-10-CM

## 2015-08-16 DIAGNOSIS — R29898 Other symptoms and signs involving the musculoskeletal system: Secondary | ICD-10-CM

## 2015-08-16 DIAGNOSIS — M25511 Pain in right shoulder: Secondary | ICD-10-CM

## 2015-08-16 NOTE — Therapy (Signed)
[place], [place], [address] Phone: [phone]   Fax:  [phone]  Occupational Therapy Treatment  Patient Details  Name: Dawn Edwards MRN: [id number] Date of Birth: [birth year] Referring Provider:  Netta Cedars, MD  Encounter Date: 08/16/2015      OT End of Session - 08/16/15 1156    Visit Number 22   Number of Visits 24   Date for OT Re-Evaluation 09/29/15  mini reassess on 10/18   Authorization Type UHC   Authorization Time Period 60 visit limit. Patient has used 0 this year.   Authorization - Visit Number 22   Authorization - Number of Visits 39   OT Start Time [phone]   OT Stop Time 1015   OT Time Calculation (min) 57 min   Activity Tolerance Patient tolerated treatment well   Behavior During Therapy [facility] for tasks assessed/performed      History reviewed. No pertinent past medical history.  Past Surgical History  Procedure Laterality Date  . Cesarean section      There were no vitals filed for this visit.  Visit Diagnosis:  Shoulder weakness  Tight fascia  Pain in joint, shoulder region, right  Shoulder joint stiffness, right      Subjective Assessment - 08/16/15 0941    Subjective  S: I [place] to the fair yesterday. There was a woman that bumped right into my arm. That hurt!   Currently in Pain? Yes   Pain Score 2    Pain Location Shoulder   Pain Orientation Right   Pain Descriptors / Indicators Aching   Pain Type Acute pain            [facility] OT Assessment - 08/16/15 0942    Assessment   Diagnosis Right rotator cuff repair   Precautions   Precautions Shoulder   Type of Shoulder Precautions Begin strengthening. Progress as tolerated.                  OT Treatments/Exercises (OP) - 08/16/15 0942    Exercises   Exercises Shoulder   Shoulder Exercises: Supine   Protraction PROM;5 reps;Strengthening;12 reps   Protraction Weight (lbs) 1   Horizontal ABduction  PROM;5 reps;Strengthening;12 reps   Horizontal ABduction Weight (lbs) 1   External Rotation PROM;5 reps;Strengthening;12 reps   External Rotation Weight (lbs) 1   Internal Rotation PROM;5 reps;Strengthening;12 reps   Internal Rotation Weight (lbs) 1   Flexion PROM;5 reps;Strengthening;12 reps   Shoulder Flexion Weight (lbs) 1   ABduction PROM;5 reps;Strengthening;12 reps   Shoulder ABduction Weight (lbs) 1   Shoulder Exercises: Prone   Flexion Strengthening;12 reps   Flexion Weight (lbs) 1   Extension Strengthening;12 reps   Extension Weight (lbs) 1   Other Prone Exercises Hughston exercises 12X   Other Prone Exercises Scapular raises 12X   Shoulder Exercises: Standing   Protraction Strengthening;10 reps   Protraction Weight (lbs) 1   Horizontal ABduction Strengthening;10 reps   Horizontal ABduction Weight (lbs) 1   External Rotation Strengthening;10 reps   External Rotation Weight (lbs) 1   Internal Rotation Strengthening;10 reps   Internal Rotation Weight (lbs) 1   Flexion Strengthening;10 reps   Shoulder Flexion Weight (lbs) 1   ABduction Strengthening;10 reps   Shoulder ABduction Weight (lbs) 1   Shoulder Exercises: ROM/Strengthening   UBE (Upper Arm Bike) Level 1 3' forward 3' reverse  cues to maintain a constant speed and depress shoulder   Cybex Press 1  plate  42P   Cybex Row 1 plate  53I   X to V Arms 10X with 1#   Pendulum -   Proximal Shoulder Strengthening, Supine 12X with 1# no rest breaks   Proximal Shoulder Strengthening, Seated 12X with 1# no rest breaks   Manual Therapy   Manual Therapy Myofascial release;Muscle Energy Technique   Myofascial Release Myofascial release to left upper arm, trapezious, and scapularis region to decrease fascial restrictions and increase joint mobility in a pain free zone.    Muscle Energy Technique Muscle energy technique to right anterior deltoid to relax tone and muscle spasm and increase ROM.                  OT  Short Term Goals - 07/31/15 1020    OT SHORT TERM GOAL #1   Title Patient will be independent and educated on HEP.   Status On-going   OT SHORT TERM GOAL #2   Title Patient will increase P/ROM to [facility] to increase ability to get shirts on and off with less difficulty.    OT SHORT TERM GOAL #3   Title Patient will decrease fascial restrctions from max to mod amount.    OT SHORT TERM GOAL #4   Title Patient will decrease pain level to 5/10 with daily tasks.   OT SHORT TERM GOAL #5   Title Patient will increase right shoulder strength to 3/5 to increase ability to complete household tasks at home with less difficulty.            OT Long Term Goals - 07/26/15 1002    OT LONG TERM GOAL #1   Title Patient will return to highest level of independence with all daily, leisure, and work tasks.   Status On-going   OT LONG TERM GOAL #2   Title Patient will increase Right shoulder A/ROM to WNL to increase ability to complete overhead tasks as work.    Status On-going   OT LONG TERM GOAL #3   Title Patient will increase right shoulder strength to 4+/5 to increase ability to complete work related tasks such as lifting, etc.   Status On-going   OT LONG TERM GOAL #4   Title Patient will decrease fascial restrictions from mod to min amount.    Status On-going   OT LONG TERM GOAL #5   Title Patient will decrease pain level to 3/10 or less during daily tasks.    Status On-going               Plan - 08/16/15 1156    Clinical Impression Statement A: Added cybex row and press this session as well as increased repetitions to 12 for supine and standing. Added additional prone exercises to work on scapular strengthening. Pt tolerated well with muscle fatigue.    Plan P: Cont to work on strengthening exercises.        Problem List There are no active problems to display for this patient.   Ailene Ravel, OTR/L,CBIS  [phone]  08/16/2015, 12:00 PM  [place] [address] [place], [place], [address] Phone: [phone]   Fax:  [phone]

## 2015-08-21 ENCOUNTER — Ambulatory Visit (HOSPITAL_COMMUNITY): Payer: 59 | Admitting: Specialist

## 2015-08-21 DIAGNOSIS — M6289 Other specified disorders of muscle: Secondary | ICD-10-CM

## 2015-08-21 DIAGNOSIS — R29898 Other symptoms and signs involving the musculoskeletal system: Secondary | ICD-10-CM

## 2015-08-21 DIAGNOSIS — M629 Disorder of muscle, unspecified: Secondary | ICD-10-CM | POA: Diagnosis not present

## 2015-08-21 DIAGNOSIS — M25511 Pain in right shoulder: Secondary | ICD-10-CM

## 2015-08-21 NOTE — Therapy (Signed)
[place], [place], [address] Phone: [phone]   Fax:  [phone]  Occupational Therapy Treatment  Patient Details  Name: Dawn Edwards MRN: [id number] Date of Birth: 1966/05/14 Referring Provider:  Netta Cedars, MD  Encounter Date: 08/21/2015      OT End of Session - 08/21/15 1236    Visit Number 23   Number of Visits 24   Date for OT Re-Evaluation 09/29/15  mini reassess on 08/30/15   Authorization Type UHC   Authorization Time Period 60 visit limit. Patient has used 0 this year.   Authorization - Visit Number 23   Authorization - Number of Visits 60   OT Start Time 6387   OT Stop Time 1020   OT Time Calculation (min) 47 min   Activity Tolerance Patient tolerated treatment well   Behavior During Therapy WFL for tasks assessed/performed      No past medical history on file.  Past Surgical History  Procedure Laterality Date  . Cesarean section      There were no vitals filed for this visit.  Visit Diagnosis:  Shoulder weakness  Tight fascia  Pain in joint, shoulder region, right      Subjective Assessment - 08/21/15 0935    Subjective  S:  It was a little sore this weekend because I started the strengthening.    Limitations Protocol per MD: Begin strengthening. OK to complete in all ranges.    Currently in Pain? No/denies   Pain Score 0-No pain            [facility] OT Assessment - 08/21/15 0001    Assessment   Diagnosis Right rotator cuff repair   Precautions   Precautions Shoulder   Type of Shoulder Precautions Begin strengthening. Progress as tolerated.                  OT Treatments/Exercises (OP) - 08/21/15 0001    Exercises   Exercises Shoulder   Shoulder Exercises: Supine   Protraction PROM;5 reps;Strengthening;10 reps   Protraction Weight (lbs) 2   Horizontal ABduction PROM;5 reps;Strengthening;10 reps   Horizontal ABduction Weight (lbs) 2   External  Rotation PROM;5 reps;Strengthening;10 reps   External Rotation Weight (lbs) 2   Internal Rotation PROM;5 reps;Strengthening;10 reps   Internal Rotation Weight (lbs) 2   Flexion PROM;5 reps;Strengthening;10 reps   Shoulder Flexion Weight (lbs) 2   ABduction PROM;5 reps;Strengthening;10 reps   Shoulder ABduction Weight (lbs) 2   Shoulder Exercises: Standing   Protraction Strengthening;10 reps   Protraction Weight (lbs) 2   Horizontal ABduction Strengthening;10 reps   Horizontal ABduction Weight (lbs) 2   External Rotation Strengthening;10 reps   External Rotation Weight (lbs) 2   Internal Rotation Strengthening;10 reps   Internal Rotation Weight (lbs) 2   Flexion Strengthening;10 reps   Shoulder Flexion Weight (lbs) 2   ABduction Strengthening;10 reps   Shoulder ABduction Weight (lbs) 2   Other Standing Exercises functional reaching into internal rotation and combining with elbow flexion holding 5 seconds  X 5 repetitions with max difficulty   Shoulder Exercises: ROM/Strengthening   Cybex Press 1.5 plate;15 reps   Cybex Row 1.5 plate;15 reps   Other ROM/Strengthening Exercises sliding hand up pole fixed on bed and corner of room into flexed position while rotating body to left 15 times.   Other ROM/Strengthening Exercises standing completed body/arm swinging activity from side to side (opposite arm to opposite leg) 15 times slowly and  15 times with increased momentum, and then completed again swinging arms upwards in PNF patterns 15 times with moderate force.   Manual Therapy   Manual Therapy Myofascial release   Myofascial Release Myofascial release to right upper arm, trapezious, and scapularis region to decrease fascial restrictions and increase joint mobility in a pain free zone.                   OT Short Term Goals - 07/31/15 1020    OT SHORT TERM GOAL #1   Title Patient will be independent and educated on HEP.   Status On-going   OT SHORT TERM GOAL #2   Title  Patient will increase P/ROM to [facility] to increase ability to get shirts on and off with less difficulty.    OT SHORT TERM GOAL #3   Title Patient will decrease fascial restrctions from max to mod amount.    OT SHORT TERM GOAL #4   Title Patient will decrease pain level to 5/10 with daily tasks.   OT SHORT TERM GOAL #5   Title Patient will increase right shoulder strength to 3/5 to increase ability to complete household tasks at home with less difficulty.            OT Long Term Goals - 07/26/15 1002    OT LONG TERM GOAL #1   Title Patient will return to highest level of independence with all daily, leisure, and work tasks.   Status On-going   OT LONG TERM GOAL #2   Title Patient will increase Right shoulder A/ROM to WNL to increase ability to complete overhead tasks as work.    Status On-going   OT LONG TERM GOAL #3   Title Patient will increase right shoulder strength to 4+/5 to increase ability to complete work related tasks such as lifting, etc.   Status On-going   OT LONG TERM GOAL #4   Title Patient will decrease fascial restrictions from mod to min amount.    Status On-going   OT LONG TERM GOAL #5   Title Patient will decrease pain level to 3/10 or less during daily tasks.    Status On-going               Plan - 08/21/15 1237    Clinical Impression Statement A:  Patient had increased ability to reach into internal rotation after MFR to anterior shoulder.    Plan P:  improve internal rotation for increased independence with reaching behind back.  increase strengthening repetitions in supine and seated.         Problem List There are no active problems to display for this patient.   Vangie Bicker, OTR/L [phone]  08/21/2015, 12:45 PM  [place] [address] [place], [place], [address] Phone: [phone]   Fax:  [phone]

## 2015-08-23 ENCOUNTER — Ambulatory Visit (HOSPITAL_COMMUNITY): Payer: 59

## 2015-08-23 ENCOUNTER — Encounter (HOSPITAL_COMMUNITY): Payer: Self-pay

## 2015-08-23 DIAGNOSIS — M629 Disorder of muscle, unspecified: Secondary | ICD-10-CM

## 2015-08-23 DIAGNOSIS — M25611 Stiffness of right shoulder, not elsewhere classified: Secondary | ICD-10-CM

## 2015-08-23 DIAGNOSIS — R29898 Other symptoms and signs involving the musculoskeletal system: Secondary | ICD-10-CM

## 2015-08-23 DIAGNOSIS — M6289 Other specified disorders of muscle: Secondary | ICD-10-CM

## 2015-08-23 NOTE — Therapy (Signed)
[place], [place], [address] Phone: [phone]   Fax:  [phone]  Occupational Therapy Treatment  Patient Details  Name: Dawn Edwards MRN: [id number] Date of Birth: 24-Sep-1966 Referring Provider:  Netta Cedars, MD  Encounter Date: 08/23/2015      OT End of Session - 08/23/15 1110    Visit Number 24   Number of Visits 26   Date for OT Re-Evaluation 09/29/15  mini reassess on 08/30/15   Authorization Type UHC   Authorization Time Period 60 visit limit. Patient has used 0 this year.   Authorization - Visit Number 24   Authorization - Number of Visits 71   OT Start Time [phone]   OT Stop Time 1015   OT Time Calculation (min) 39 min   Activity Tolerance Patient tolerated treatment well   Behavior During Therapy [facility] for tasks assessed/performed      History reviewed. No pertinent past medical history.  Past Surgical History  Procedure Laterality Date  . Cesarean section      There were no vitals filed for this visit.  Visit Diagnosis:  Shoulder weakness  Tight fascia  Shoulder joint stiffness, right      Subjective Assessment - 08/23/15 0959    Currently in Pain? Yes   Pain Score 4    Pain Location Neck   Pain Orientation Lateral;Left   Pain Descriptors / Indicators Aching;Tightness   Pain Type Acute pain            [facility] OT Assessment - 08/23/15 1000    Assessment   Diagnosis Right rotator cuff repair   Precautions   Precautions Shoulder   Type of Shoulder Precautions Begin strengthening. Progress as tolerated.                  OT Treatments/Exercises (OP) - 08/23/15 1001    Exercises   Exercises Shoulder;Work Quarry manager Exercises: Supine   Protraction PROM;5 reps;Strengthening;12 reps   Protraction Weight (lbs) 2   Horizontal ABduction PROM;5 reps;Strengthening;12 reps   Horizontal ABduction Weight (lbs) 2   External Rotation PROM;5 reps;Strengthening;12  reps   External Rotation Weight (lbs) 2   Internal Rotation PROM;5 reps;Strengthening;12 reps   Internal Rotation Weight (lbs) 2   Flexion PROM;5 reps;Strengthening;12 reps   Shoulder Flexion Weight (lbs) 2   ABduction PROM;5 reps   Shoulder Exercises: Standing   Protraction Strengthening;12 reps   Protraction Weight (lbs) 2   Horizontal ABduction Strengthening;12 reps   Horizontal ABduction Weight (lbs) 2   External Rotation Strengthening;12 reps   External Rotation Weight (lbs) 2   Internal Rotation Strengthening;12 reps   Internal Rotation Weight (lbs) 2   Flexion Strengthening;12 reps   Shoulder Flexion Weight (lbs) 2   ABduction Strengthening;12 reps   Shoulder ABduction Weight (lbs) 2   Shoulder Exercises: Stretch   Internal Rotation Stretch 3 reps  10 seconds; towel   Work Chief Strategy Officer from Waist to Overhead (lbs/reps) Patient stimuated work task in which she must lift box from waist level to shoulder level or slightly above. Started with 1kg weight and completed 3 reps. Also completed with 5 lb. weight and completed 3 times. Moderate fatigue noted.    Manual Therapy   Manual Therapy Myofascial release;Muscle Energy Technique;Manual Traction   Manual therapy comments Myofascial release to left lateral region of cervical spine to decrease fascial restrictions and increase joint mobility in a pain free zone.  Myofascial Release Myofascial release to right upper arm, trapezious, and scapularis region to decrease fascial restrictions and increase joint mobility in a pain free zone.    Manual Traction Manual traction completed to cervical region to decrease pain and muscle tightness and increase joint mobility.    Muscle Energy Technique Muscle energy technique to right anterior deltoid to relax tone and muscle spasm and increase ROM.                  OT Short Term Goals - 07/31/15 1020    OT SHORT TERM GOAL #1   Title Patient will be independent and  educated on HEP.   Status On-going   OT SHORT TERM GOAL #2   Title Patient will increase P/ROM to [facility] to increase ability to get shirts on and off with less difficulty.    OT SHORT TERM GOAL #3   Title Patient will decrease fascial restrctions from max to mod amount.    OT SHORT TERM GOAL #4   Title Patient will decrease pain level to 5/10 with daily tasks.   OT SHORT TERM GOAL #5   Title Patient will increase right shoulder strength to 3/5 to increase ability to complete household tasks at home with less difficulty.            OT Long Term Goals - 07/26/15 1002    OT LONG TERM GOAL #1   Title Patient will return to highest level of independence with all daily, leisure, and work tasks.   Status On-going   OT LONG TERM GOAL #2   Title Patient will increase Right shoulder A/ROM to WNL to increase ability to complete overhead tasks as work.    Status On-going   OT LONG TERM GOAL #3   Title Patient will increase right shoulder strength to 4+/5 to increase ability to complete work related tasks such as lifting, etc.   Status On-going   OT LONG TERM GOAL #4   Title Patient will decrease fascial restrictions from mod to min amount.    Status On-going   OT LONG TERM GOAL #5   Title Patient will decrease pain level to 3/10 or less during daily tasks.    Status On-going               Plan - 08/23/15 1110    Clinical Impression Statement A: Added IR stretch with towel to increase ability to reach behind back. Also completed a work simulation task in preparation of returning to work on the Oct. 26th.   Plan P: Cont with work hardening task increasing weight in box as able to tolerate.        Problem List There are no active problems to display for this patient.   Ailene Ravel, OTR/L,CBIS  [phone]  08/23/2015, 11:13 AM  [place], [place], [address] Phone: [phone]   Fax:   [phone]

## 2015-08-28 ENCOUNTER — Encounter (HOSPITAL_COMMUNITY): Payer: Self-pay

## 2015-08-28 ENCOUNTER — Ambulatory Visit (HOSPITAL_COMMUNITY): Payer: 59

## 2015-08-28 DIAGNOSIS — M6289 Other specified disorders of muscle: Secondary | ICD-10-CM

## 2015-08-28 DIAGNOSIS — M25511 Pain in right shoulder: Secondary | ICD-10-CM

## 2015-08-28 DIAGNOSIS — M629 Disorder of muscle, unspecified: Secondary | ICD-10-CM | POA: Diagnosis not present

## 2015-08-28 DIAGNOSIS — R29898 Other symptoms and signs involving the musculoskeletal system: Secondary | ICD-10-CM

## 2015-08-28 DIAGNOSIS — M25611 Stiffness of right shoulder, not elsewhere classified: Secondary | ICD-10-CM

## 2015-08-28 NOTE — Therapy (Signed)
[place], [place], [address] Phone: [phone]   Fax:  [phone]  Occupational Therapy Treatment  Patient Details  Name: Dawn Edwards MRN: [id number] Date of Birth: [birth year] No Data Recorded  Encounter Date: 08/28/2015      OT End of Session - 08/28/15 1043    Visit Number 25   Number of Visits 26   Date for OT Re-Evaluation 09/29/15  mini reassess on 08/30/15   Authorization Type UHC   Authorization Time Period 60 visit limit. Patient has used 0 this year.   Authorization - Visit Number 25   Authorization - Number of Visits 60   OT Start Time 0930   OT Stop Time 1015   OT Time Calculation (min) 45 min   Activity Tolerance Patient tolerated treatment well   Behavior During Therapy WFL for tasks assessed/performed      History reviewed. No pertinent past medical history.  Past Surgical History  Procedure Laterality Date  . Cesarean section      There were no vitals filed for this visit.  Visit Diagnosis:  Tight fascia  Shoulder weakness  Shoulder joint stiffness, right  Pain in joint, shoulder region, right      Subjective Assessment - 08/28/15 0948    Subjective  S: I raked leaves this weekend.   Currently in Pain? Yes   Pain Score 1    Pain Location Shoulder   Pain Orientation Right   Pain Descriptors / Indicators Tightness;Sore   Pain Type Acute pain            [facility] OT Assessment - 08/28/15 0949    Assessment   Diagnosis Right rotator cuff repair   Precautions   Precautions Shoulder   Type of Shoulder Precautions Begin strengthening. Progress as tolerated.                  OT Treatments/Exercises (OP) - 08/28/15 0949    Exercises   Exercises Shoulder;Work Quarry manager Exercises: Supine   Protraction PROM;5 reps;Strengthening;15 reps   Protraction Weight (lbs) 2   Horizontal ABduction PROM;5 reps;Strengthening;15 reps   Horizontal ABduction  Weight (lbs) 2   External Rotation PROM;5 reps;Strengthening;15 reps   External Rotation Weight (lbs) 2   Internal Rotation PROM;5 reps;Strengthening;15 reps   Internal Rotation Weight (lbs) 2   Flexion PROM;5 reps;Strengthening;15 reps   Shoulder Flexion Weight (lbs) 2   ABduction PROM;5 reps;Strengthening;15 reps   Shoulder ABduction Weight (lbs) 2   Shoulder Exercises: Standing   Protraction Strengthening;15 reps   Protraction Weight (lbs) 2   Horizontal ABduction Strengthening;15 reps   Horizontal ABduction Weight (lbs) 2   External Rotation Strengthening;Theraband;15 reps   Theraband Level (Shoulder External Rotation) Level 4 (Blue)   External Rotation Weight (lbs) 2   Internal Rotation Strengthening;Theraband;15 reps   Theraband Level (Shoulder Internal Rotation) Level 4 (Blue)   Internal Rotation Weight (lbs) 2   Flexion Strengthening;15 reps   Shoulder Flexion Weight (lbs) 2   ABduction Strengthening;15 reps   Shoulder ABduction Weight (lbs) 2   Extension Theraband;15 reps   Theraband Level (Shoulder Extension) Level 4 (Blue)   Row Theraband;15 reps   Theraband Level (Shoulder Row) Level 4 (Blue)   Retraction Theraband;15 reps   Theraband Level (Shoulder Retraction) Level 4 (Blue)   Shoulder Exercises: ROM/Strengthening   Proximal Shoulder Strengthening, Supine 15X with 2# no rest breaks   Proximal Shoulder Strengthening, Seated 15X with 2# no rest  breaks   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 Started with 1kg weight and completed 3 reps. Also completed with 5 lb., 8 lb., and 10 lb. weight and completed 5 times each. Moderate fatigue noted.    Manual Therapy   Manual Therapy Myofascial release   Myofascial Release Myofascial release to right upper arm, trapezious, and scapularis region to decrease fascial restrictions and increase joint  mobility in a pain free zone.                   OT Short Term Goals - 07/31/15 1020    OT SHORT TERM GOAL #1   Title Patient will be independent and educated on HEP.   Status On-going   OT SHORT TERM GOAL #2   Title Patient will increase P/ROM to [facility] to increase ability to get shirts on and off with less difficulty.    OT SHORT TERM GOAL #3   Title Patient will decrease fascial restrctions from max to mod amount.    OT SHORT TERM GOAL #4   Title Patient will decrease pain level to 5/10 with daily tasks.   OT SHORT TERM GOAL #5   Title Patient will increase right shoulder strength to 3/5 to increase ability to complete household tasks at home with less difficulty.            OT Long Term Goals - 07/26/15 1002    OT LONG TERM GOAL #1   Title Patient will return to highest level of independence with all daily, leisure, and work tasks.   Status On-going   OT LONG TERM GOAL #2   Title Patient will increase Right shoulder A/ROM to WNL to increase ability to complete overhead tasks as work.    Status On-going   OT LONG TERM GOAL #3   Title Patient will increase right shoulder strength to 4+/5 to increase ability to complete work related tasks such as lifting, etc.   Status On-going   OT LONG TERM GOAL #4   Title Patient will decrease fascial restrictions from mod to min amount.    Status On-going   OT LONG TERM GOAL #5   Title Patient will decrease pain level to 3/10 or less during daily tasks.    Status On-going               Plan - 08/28/15 1044    Clinical Impression Statement A: Continued with work simulaton task increasing weight as patient was able to tolerate.    Plan P: Mini reassess.         Problem List There are no active problems to display for this patient.   Ailene Ravel, OTR/L,CBIS  [phone]  08/28/2015, 10:45 AM  [place], [place], [address] Phone:  [phone]   Fax:  [phone]  Name: IYANNAH BLAKE MRN: [id number] Date of Birth: 1966-10-07

## 2015-08-30 ENCOUNTER — Ambulatory Visit (HOSPITAL_COMMUNITY): Payer: 59

## 2015-08-30 DIAGNOSIS — M629 Disorder of muscle, unspecified: Secondary | ICD-10-CM

## 2015-08-30 DIAGNOSIS — R29898 Other symptoms and signs involving the musculoskeletal system: Secondary | ICD-10-CM

## 2015-08-30 DIAGNOSIS — M6289 Other specified disorders of muscle: Secondary | ICD-10-CM

## 2015-08-30 DIAGNOSIS — M25611 Stiffness of right shoulder, not elsewhere classified: Secondary | ICD-10-CM

## 2015-08-30 NOTE — Therapy (Signed)
[place], [place], [address] Phone: [phone]   Fax:  [phone]  Occupational Therapy Treatment  Patient Details  Name: Dawn Edwards MRN: [id number] Date of Birth: 12-16-1965 Referring Provider: Dr. Netta Cedars, MD  Encounter Date: 08/30/2015      OT End of Session - 08/30/15 1201    Visit Number 26   Number of Visits 32   Date for OT Re-Evaluation 09/29/15   Authorization Type UHC   Authorization Time Period 60 visit limit. Patient has used 0 this year.   Authorization - Visit Number 26   Authorization - Number of Visits 79   OT Start Time [phone]   OT Stop Time 1015   OT Time Calculation (min) 39 min   Activity Tolerance Patient tolerated treatment well   Behavior During Therapy WFL for tasks assessed/performed      No past medical history on file.  Past Surgical History  Procedure Laterality Date  . Cesarean section      There were no vitals filed for this visit.  Visit Diagnosis:  Tight fascia  Shoulder weakness  Shoulder joint stiffness, right      Subjective Assessment - 08/30/15 1147    Subjective  I was staining my deck so my arm is a little sore today.   Currently in Pain? Yes   Pain Score 2    Pain Location Shoulder   Pain Orientation Right   Pain Descriptors / Indicators Sore   Pain Type Acute pain            [facility] OT Assessment - 08/30/15 0938    Assessment   Diagnosis Right rotator cuff repair   Referring Provider Dr. Netta Cedars, MD   Precautions   Precautions Shoulder   Type of Shoulder Precautions Begin strengthening. Progress as tolerated.   AROM   Overall AROM Comments Assessed standing. er/IR adducted    AROM Assessment Site Shoulder   Right/Left Shoulder Right   Right Shoulder Flexion 148 Degrees  previous: 138   Right Shoulder ABduction 130 Degrees  previous: 120   Right Shoulder Internal Rotation 90 Degrees  previous: 90   Right Shoulder External  Rotation 64 Degrees  previous: 46   Strength   Overall Strength Comments Assessed standing. er/IR adducted. (strength has not been assessed.)   Strength Assessment Site Shoulder   Right/Left Shoulder Right   Right Shoulder Flexion 4/5   Right Shoulder ABduction 4/5   Right Shoulder Internal Rotation 3+/5   Right Shoulder External Rotation 5/5                  OT Treatments/Exercises (OP) - 08/30/15 1149    Exercises   Exercises Shoulder   Shoulder Exercises: Supine   Protraction PROM;5 reps   Horizontal ABduction PROM;5 reps   External Rotation PROM;5 reps   Internal Rotation PROM;5 reps   Flexion PROM;5 reps   ABduction PROM;5 reps   Shoulder Exercises: Standing   Other Standing Exercises Push press; 2#; 15X   Other Standing Exercises Bent over row and standing high pull; 2#; 15X   Shoulder Exercises: ROM/Strengthening   Pushups 10 reps  countertop   Plank 2 reps  20 second hold   Other ROM/Strengthening Exercises tricep dip with arm rest; 10X   Manual Therapy   Manual Therapy Myofascial release;Muscle Energy Technique   Myofascial Release Myofascial release to right upper arm, trapezious, and scapularis region to decrease fascial restrictions and increase  joint mobility in a pain free zone.    Muscle Energy Technique Muscle energy technique to right anterior deltoid to relax tone and muscle spasm and increase ROM.                  OT Short Term Goals - 07/31/15 1020    OT SHORT TERM GOAL #1   Title Patient will be independent and educated on HEP.   Status On-going   OT SHORT TERM GOAL #2   Title Patient will increase P/ROM to [facility] to increase ability to get shirts on and off with less difficulty.    OT SHORT TERM GOAL #3   Title Patient will decrease fascial restrctions from max to mod amount.    OT SHORT TERM GOAL #4   Title Patient will decrease pain level to 5/10 with daily tasks.   OT SHORT TERM GOAL #5   Title Patient will increase right  shoulder strength to 3/5 to increase ability to complete household tasks at home with less difficulty.            OT Long Term Goals - 08/30/15 0956    OT LONG TERM GOAL #1   Title Patient will return to highest level of independence with all daily, leisure, and work tasks.   Status On-going   OT LONG TERM GOAL #2   Title Patient will increase Right shoulder A/ROM to WNL to increase ability to complete overhead tasks as work.    Status On-going   OT LONG TERM GOAL #3   Title Patient will increase right shoulder strength to 4+/5 to increase ability to complete work related tasks such as lifting, etc.   Status On-going   OT LONG TERM GOAL #4   Title Patient will decrease fascial restrictions from mod to min amount.    Status Achieved   OT LONG TERM GOAL #5   Title Patient will decrease pain level to 3/10 or less during daily tasks.    Status Achieved               Plan - 08/30/15 1203    Clinical Impression Statement A: Mini reassessment completed this date. patient has shown improvement in all areas of ROM and strength. Patient continues to have deficits related in strength and ROM. Patient has met 2/5 LTGs and is progressing towards remaining therapy goals. Recommend patient continue therapy for approx. 3 more weeks to focus on strength and ROM.    Plan P: Cont therapy for 3 more weeks with focus on strength and ROM. Focus on work hardening tasks. Cont with planks and push ups. Attempt BOSU ball plank walk ups.        Problem List There are no active problems to display for this patient.   Ailene Ravel, OTR/L,CBIS  [phone]  08/30/2015, 12:13 PM  [place] [address] [place], [place], [address] Phone: [phone]   Fax:  [phone]  Name: Dawn Edwards MRN: [id number] Date of Birth: 12/23/1965

## 2015-09-04 ENCOUNTER — Encounter (HOSPITAL_COMMUNITY): Payer: Self-pay

## 2015-09-04 ENCOUNTER — Ambulatory Visit (HOSPITAL_COMMUNITY): Payer: 59

## 2015-09-04 DIAGNOSIS — M25611 Stiffness of right shoulder, not elsewhere classified: Secondary | ICD-10-CM

## 2015-09-04 DIAGNOSIS — R29898 Other symptoms and signs involving the musculoskeletal system: Secondary | ICD-10-CM

## 2015-09-04 DIAGNOSIS — M6289 Other specified disorders of muscle: Secondary | ICD-10-CM

## 2015-09-04 DIAGNOSIS — M629 Disorder of muscle, unspecified: Secondary | ICD-10-CM | POA: Diagnosis not present

## 2015-09-04 NOTE — Therapy (Signed)
[place], [place], [address] Phone: [phone]   Fax:  [phone]  Occupational Therapy Treatment  Patient Details  Name: Dawn Edwards MRN: [id number] Date of Birth: Jan 13, 1966 Referring Provider: Dr. Netta Cedars, MD  Encounter Date: 09/04/2015      OT End of Session - 09/04/15 1058    Visit Number 27   Number of Visits 32   Date for OT Re-Evaluation 09/29/15   Authorization Type UHC   Authorization Time Period 60 visit limit. Patient has used 0 this year.   Authorization - Visit Number 27   Authorization - Number of Visits 22   OT Start Time 0850   OT Stop Time 0930   OT Time Calculation (min) 40 min   Activity Tolerance Patient tolerated treatment well   Behavior During Therapy [facility] for tasks assessed/performed      History reviewed. No pertinent past medical history.  Past Surgical History  Procedure Laterality Date  . Cesarean section      There were no vitals filed for this visit.  Visit Diagnosis:  Tight fascia  Shoulder weakness  Shoulder joint stiffness, right      Subjective Assessment - 09/04/15 0906    Subjective  S: This weekend it was really sore. Today it's fine though.   Currently in Pain? No/denies            [facility] OT Assessment - 09/04/15 7371    Assessment   Diagnosis Right rotator cuff repair   Precautions   Precautions Shoulder   Type of Shoulder Precautions Begin strengthening. Progress as tolerated.                  OT Treatments/Exercises (OP) - 09/04/15 0907    Exercises   Exercises Shoulder   Shoulder Exercises: Supine   Protraction PROM;5 reps;Strengthening;20 reps   Protraction Weight (lbs) 2   Horizontal ABduction PROM;5 reps;Strengthening;20 reps   Horizontal ABduction Weight (lbs) 2   External Rotation PROM;5 reps;Strengthening;20 reps   External Rotation Weight (lbs) 2   Internal Rotation PROM;5 reps;Strengthening;20 reps   Internal Rotation Weight (lbs) 2   Flexion PROM;5 reps;Strengthening;20 reps   Shoulder Flexion Weight (lbs) 2   ABduction PROM;5 reps;Strengthening;20 reps   Shoulder ABduction Weight (lbs) 2   Shoulder Exercises: Sidelying   External Rotation Strengthening;15 reps   External Rotation Weight (lbs) 2   Internal Rotation Strengthening;15 reps   Internal Rotation Weight (lbs) 2   Flexion Strengthening;12 reps   Flexion Weight (lbs) 2   ABduction Strengthening;12 reps   ABduction Weight (lbs) 2   Shoulder Exercises: ROM/Strengthening   Proximal Shoulder Strengthening, Supine 20X with 2# no rest breaks   Manual Therapy   Manual Therapy Myofascial release   Myofascial Release Myofascial release to right upper arm, trapezious, and scapularis region to decrease fascial restrictions and increase joint mobility in a pain free zone.                   OT Short Term Goals - 07/31/15 1020    OT SHORT TERM GOAL #1   Title Patient will be independent and educated on HEP.   Status On-going   OT SHORT TERM GOAL #2   Title Patient will increase P/ROM to [facility] to increase ability to get shirts on and off with less difficulty.    OT SHORT TERM GOAL #3   Title Patient will decrease fascial restrctions from max to mod amount.  OT SHORT TERM GOAL #4   Title Patient will decrease pain level to 5/10 with daily tasks.   OT SHORT TERM GOAL #5   Title Patient will increase right shoulder strength to 3/5 to increase ability to complete household tasks at home with less difficulty.            OT Long Term Goals - 09/04/15 1217    OT LONG TERM GOAL #1   Title Patient will return to highest level of independence with all daily, leisure, and work tasks.   Status On-going   OT LONG TERM GOAL #2   Title Patient will increase Right shoulder A/ROM to WNL to increase ability to complete overhead tasks as work.    Status On-going   OT LONG TERM GOAL #3   Title Patient will increase right shoulder  strength to 4+/5 to increase ability to complete work related tasks such as lifting, etc.   Status On-going   OT LONG TERM GOAL #4   Title Patient will decrease fascial restrictions from mod to min amount.    OT LONG TERM GOAL #5   Title Patient will decrease pain level to 3/10 or less during daily tasks.                Plan - 09/04/15 1058    Clinical Impression Statement A: Added sidelying strengthening with 2# weight this session to increase shoulder stability. Pt completed with reports that they were challenging. Pt was given print out of measurements taken last session as she has a followup appt tomorrow for her shoulder.    Plan P: Cont with planks and push ups. Attempt BOSU ball plank walk ups. Follow up on MD appt.        Problem List There are no active problems to display for this patient.   Ailene Ravel, OTR/L,CBIS  [phone]  09/04/2015, 12:19 PM  [place] [address] [place], [place], [address] Phone: [phone]   Fax:  [phone]  Name: Dawn Edwards MRN: [id number] Date of Birth: [birth year]

## 2015-09-06 ENCOUNTER — Ambulatory Visit (HOSPITAL_COMMUNITY): Payer: 59

## 2015-09-06 ENCOUNTER — Encounter (HOSPITAL_COMMUNITY): Payer: Self-pay

## 2015-09-06 DIAGNOSIS — M542 Cervicalgia: Secondary | ICD-10-CM

## 2015-09-06 DIAGNOSIS — M629 Disorder of muscle, unspecified: Secondary | ICD-10-CM

## 2015-09-06 DIAGNOSIS — M6289 Other specified disorders of muscle: Secondary | ICD-10-CM

## 2015-09-06 DIAGNOSIS — M25511 Pain in right shoulder: Secondary | ICD-10-CM

## 2015-09-06 DIAGNOSIS — M25611 Stiffness of right shoulder, not elsewhere classified: Secondary | ICD-10-CM

## 2015-09-06 DIAGNOSIS — R29898 Other symptoms and signs involving the musculoskeletal system: Secondary | ICD-10-CM

## 2015-09-06 NOTE — Patient Instructions (Signed)
Neck Stretch (Chair) - Variation 1    One hand holding edge of chair, roll same side shoulder back to open chest. Tip head to other side, chin slightly tucked. Optional: Place other hand above temple for sensitive pressure to deepen stretch. Hold for _10___ seconds. Repeat __3__ times each side.    Lower Cervical / Upper Thoracic Stretch    Clasp hands together in front with arms extended. Gently pull shoulder blades apart and bend head forward. Hold _10___ seconds. Repeat _3___ times per set. Do ____ sets per session. Do 2-3____ sessions per day.  Flexibility: Neck Stretch    Grasp left arm above wrist and pull down across body while gently tilting head same direction. Hold __10__ seconds. Relax. Repeat __3__ times per set. Do ____ sets per session. Do _2-3___ sessions per day.

## 2015-09-06 NOTE — Therapy (Signed)
[place], [place], [address] Phone: [phone]   Fax:  [phone]  Occupational Therapy Treatment  Patient Details  Name: Dawn Edwards MRN: [id number] Date of Birth: 10/03/1966 Referring Provider: Dr. Netta Cedars, MD  Encounter Date: 09/06/2015      OT End of Session - 09/06/15 1152    Visit Number 28   Number of Visits 32   Date for OT Re-Evaluation 09/29/15   Authorization Type UHC   Authorization Time Period 60 visit limit. Patient has used 0 this year.   Authorization - Visit Number 28   Authorization - Number of Visits 60   OT Start Time 1020   OT Stop Time 1100   OT Time Calculation (min) 40 min   Activity Tolerance Patient tolerated treatment well   Behavior During Therapy WFL for tasks assessed/performed      History reviewed. No pertinent past medical history.  Past Surgical History  Procedure Laterality Date  . Cesarean section      There were no vitals filed for this visit.  Visit Diagnosis:  Tight fascia  Shoulder weakness  Shoulder joint stiffness, right  Pain in joint, shoulder region, right  Cervical pain      Subjective Assessment - 09/06/15 1059    Subjective  S: I went to the doctor and he was really pleased with everything. He also wants to add my neck to therapy because it's been bothering me. He wrote you an order.    Limitations MD orders to add cervical spine to therapy. Perform myofascial release, A/ROM, and traction.   Currently in Pain? Yes   Pain Score 3    Pain Location Shoulder   Pain Orientation Right   Pain Descriptors / Indicators Sore   Pain Type Acute pain   Multiple Pain Sites Yes   Pain Score 2   Pain Location Neck   Pain Orientation Left;Lateral;Anterior   Pain Descriptors / Indicators Constant;Sore   Pain Type Acute pain            [facility] OT Assessment - 09/06/15 1148    Assessment   Diagnosis Right rotator cuff repair   Precautions    Precautions Shoulder   Type of Shoulder Precautions Begin strengthening. Progress as tolerated.                  OT Treatments/Exercises (OP) - 09/06/15 1149    Exercises   Exercises Shoulder   Neck Exercises: Stretches   Upper Trapezius Stretch 3 reps;10 seconds   Levator Stretch 3 reps;10 seconds   Neck Stretch 3 reps;10 seconds   Other Neck Stretches lower cervical/upper trazius stretch; 10" hold; 3 times.   Neck Exercises: Seated   Cervical Rotation Both;10 reps   Shoulder Exercises: Supine   Protraction PROM;5 reps   Horizontal ABduction PROM;5 reps   External Rotation PROM   Internal Rotation PROM;5 reps   Flexion PROM;5 reps   ABduction PROM;5 reps   Shoulder Exercises: ROM/Strengthening   "W" Arms 10X   Manual Therapy   Manual Therapy Myofascial release;Manual Traction   Myofascial Release Myofascial release to right upper arm, trapezious, and scapularis region to decrease fascial restrictions and increase joint mobility in a pain free zone.    Manual Traction Manual traction completed to cervical region to decrease pain and muscle tightness and increase joint mobility.                 OT Education -  09/06/15 1152    Education provided Yes   Education Details Cervical stretches   Person(s) Educated Patient   Methods Explanation;Demonstration;Handout   Comprehension Returned demonstration;Verbalized understanding          OT Short Term Goals - 07/31/15 1020    OT SHORT TERM GOAL #1   Title Patient will be independent and educated on HEP.   Status On-going   OT SHORT TERM GOAL #2   Title Patient will increase P/ROM to [facility] to increase ability to get shirts on and off with less difficulty.    OT SHORT TERM GOAL #3   Title Patient will decrease fascial restrctions from max to mod amount.    OT SHORT TERM GOAL #4   Title Patient will decrease pain level to 5/10 with daily tasks.   OT SHORT TERM GOAL #5   Title Patient will increase right  shoulder strength to 3/5 to increase ability to complete household tasks at home with less difficulty.            OT Long Term Goals - 09/06/15 1156    OT LONG TERM GOAL #1   Title Patient will return to highest level of independence with all daily, leisure, and work tasks.   Status On-going   OT LONG TERM GOAL #2   Title Patient will increase Right shoulder A/ROM to WNL to increase ability to complete overhead tasks as work.    Status On-going   OT LONG TERM GOAL #3   Title Patient will increase right shoulder strength to 4+/5 to increase ability to complete work related tasks such as lifting, etc.   Status On-going   OT LONG TERM GOAL #4   Title Patient will decrease fascial restrictions from mod to min amount.    OT LONG TERM GOAL #5   Title Patient will decrease pain level to 3/10 or less during daily tasks.    Long Term Additional Goals   Additional Long Term Goals Yes   OT LONG TERM GOAL #6   Title Patient will decrease pain in cervical spine to 2/10 or less when completing daily tasks to increase functional mobility needed during daily activities.    Time 4   Period Weeks   Status New   OT LONG TERM GOAL #7   Title Patient will increase cervical ROM to [facility] to increase ability to complete daily and work activities with less difficulty.   Time 4   Period Weeks   Status New               Plan - 09/06/15 1153    Clinical Impression Statement A: Pt had a follow up appt with Dr. Veverly Fells who reports that patient's RUE is doing great. Wrote an order to add Cervical Spine to treatment plan due to recent flare up of pain and stiffness. Per MD: complete A/ROM, myofascial release, and manual traction. Pt is to continue therapy 2X a week for 4 more weeks. Added cervical stretches to HEP this session and patient completed with VC for technique.   Plan P: Cont with plan of care adding cervical spine to treatment along with RUE. If patient is able to tolerate continue with planks  and incline push ups.         Problem List There are no active problems to display for this patient.   Ailene Ravel, OTR/L,CBIS  [phone]  09/06/2015, 12:17 PM  [place] [address] [place], [place], [address]  Phone: [phone]   Fax:  [phone]  Name: Dawn Edwards MRN: [id number] Date of Birth: 12-Aug-1966

## 2015-09-11 ENCOUNTER — Ambulatory Visit (HOSPITAL_COMMUNITY): Payer: 59 | Attending: Orthopedic Surgery

## 2015-09-11 DIAGNOSIS — R29898 Other symptoms and signs involving the musculoskeletal system: Secondary | ICD-10-CM | POA: Diagnosis present

## 2015-09-11 DIAGNOSIS — M542 Cervicalgia: Secondary | ICD-10-CM | POA: Insufficient documentation

## 2015-09-11 DIAGNOSIS — M6289 Other specified disorders of muscle: Secondary | ICD-10-CM

## 2015-09-11 DIAGNOSIS — M25611 Stiffness of right shoulder, not elsewhere classified: Secondary | ICD-10-CM | POA: Insufficient documentation

## 2015-09-11 DIAGNOSIS — M629 Disorder of muscle, unspecified: Secondary | ICD-10-CM | POA: Diagnosis not present

## 2015-09-11 DIAGNOSIS — M25511 Pain in right shoulder: Secondary | ICD-10-CM | POA: Insufficient documentation

## 2015-09-11 NOTE — Therapy (Signed)
[place], [place], [address] Phone: [phone]   Fax:  [phone]  Occupational Therapy Treatment  Patient Details  Name: Dawn Edwards MRN: [id number] Date of Birth: Feb 19, 1966 Referring Provider: Dr. Netta Cedars, MD  Encounter Date: 09/11/2015      OT End of Session - 09/11/15 1212    Visit Number 29   Number of Visits 32   Date for OT Re-Evaluation 09/29/15   Authorization Type UHC   Authorization Time Period 60 visit limit. Patient has used 0 this year.   Authorization - Visit Number 29   Authorization - Number of Visits 37   OT Start Time 1015   OT Stop Time 1100   OT Time Calculation (min) 45 min   Activity Tolerance Patient tolerated treatment well   Behavior During Therapy WFL for tasks assessed/performed      No past medical history on file.  Past Surgical History  Procedure Laterality Date  . Cesarean section      There were no vitals filed for this visit.  Visit Diagnosis:  Tight fascia  Shoulder weakness  Shoulder joint stiffness, right  Pain in joint, shoulder region, right  Cervical pain      Subjective Assessment - 09/11/15 1133    Subjective  S: my neck doesn't feel too bad today. I did those stretches at home you gave me.    Currently in Pain? Yes   Pain Score 1    Pain Location Shoulder   Pain Orientation Right   Pain Descriptors / Indicators Sore   Pain Type Acute pain                      OT Treatments/Exercises (OP) - 09/11/15 0001    Exercises   Exercises Shoulder   Shoulder Exercises: Supine   Protraction PROM;5 reps;Strengthening;20 reps   Protraction Weight (lbs) 2   Horizontal ABduction PROM;5 reps;Strengthening;20 reps   Horizontal ABduction Weight (lbs) 2   External Rotation PROM;5 reps;Strengthening;20 reps   External Rotation Weight (lbs) 2   Internal Rotation PROM;5 reps;Strengthening;20 reps   Internal Rotation Weight (lbs) 2   Flexion PROM;5 reps;Strengthening;20 reps   Shoulder Flexion Weight (lbs) 2   ABduction PROM;5 reps;Strengthening;20 reps   Shoulder ABduction Weight (lbs) 2   Shoulder Exercises: ROM/Strengthening   Pushups 15 reps  countertop   Plank 2 reps  15 second hold   Shoulder Exercises: Stretch   Corner Stretch 3 reps;10 seconds   Internal Rotation Stretch 3 reps  10 seconds; towel   Manual Therapy   Manual Therapy Myofascial release;Manual Traction   Myofascial Release Myofascial release to right upper arm, trapezious, and scapularis region to decrease fascial restrictions and increase joint mobility in a pain free zone.    Manual Traction Manual traction completed to cervical region to decrease pain and muscle tightness and increase joint mobility.                 OT Education - 09/11/15 1212    Education provided Yes   Education Details shoulder stretches   Person(s) Educated Patient   Methods Explanation;Demonstration;Handout   Comprehension Returned demonstration;Verbalized understanding          OT Short Term Goals - 07/31/15 1020    OT SHORT TERM GOAL #1   Title Patient will be independent and educated on HEP.   Status On-going   OT SHORT TERM GOAL #2   Title Patient  will increase P/ROM to [facility] to increase ability to get shirts on and off with less difficulty.    OT SHORT TERM GOAL #3   Title Patient will decrease fascial restrctions from max to mod amount.    OT SHORT TERM GOAL #4   Title Patient will decrease pain level to 5/10 with daily tasks.   OT SHORT TERM GOAL #5   Title Patient will increase right shoulder strength to 3/5 to increase ability to complete household tasks at home with less difficulty.            OT Long Term Goals - 09/11/15 1222    OT LONG TERM GOAL #1   Title Patient will return to highest level of independence with all daily, leisure, and work tasks.   Status On-going   OT LONG TERM GOAL #2   Title Patient will increase Right  shoulder A/ROM to WNL to increase ability to complete overhead tasks as work.    Status On-going   OT LONG TERM GOAL #3   Title Patient will increase right shoulder strength to 4+/5 to increase ability to complete work related tasks such as lifting, etc.   Status On-going   OT LONG TERM GOAL #4   Title Patient will decrease fascial restrictions from mod to min amount.    OT LONG TERM GOAL #5   Title Patient will decrease pain level to 3/10 or less during daily tasks.    OT LONG TERM GOAL #6   Title Patient will decrease pain in cervical spine to 2/10 or less when completing daily tasks to increase functional mobility needed during daily activities.    Time 4   Period Weeks   Status On-going   OT LONG TERM GOAL #7   Title Patient will increase cervical ROM to [facility] to increase ability to complete daily and work activities with less difficulty.   Time 4   Period Weeks   Status On-going               Plan - 09/11/15 1213    Clinical Impression Statement A: Added shoulder stretches to HEP as patient continues to have joint limitations during passive stretching. Pt completed stretches with min VC for form and technique.   Plan P: Cont to provide manual therapy for cervical spine and right arm. Increase time with planks.        Problem List There are no active problems to display for this patient.   Ailene Ravel, OTR/L,CBIS  [phone]  09/11/2015, 12:23 PM  [place] [address] [place], [place], [address] Phone: [phone]   Fax:  [phone]  Name: Dawn Edwards MRN: [id number] Date of Birth: [birth year]

## 2015-09-11 NOTE — Patient Instructions (Signed)
  Flexibility: Corner Stretch   Standing in corner with hands just above shoulder level and 2-3 feet  from corner, lean forward until a comfortable stretch is felt across chest. Hold _10___ seconds. Repeat __3__ times per set. Do _1___ sets per session. Do _2-3___ sessions per day.    Flexors Stretch, Standing   Stand near wall and slide arm up, with palm facing away from wall, by leaning toward wall. Hold _10__ seconds.  Repeat 3___ times per session. Do _2-3__ sessions per day.  Internal Rotation Across Back  Grab the end of a towel with your affected side, palm facing backwards. Grab the towel with your unaffected side and pull your affected hand across your back until you feel a stretch in the front of your shoulder. If you feel pain, pull just to the pain, do not pull through the pain. Hold. Return your affected arm to your side. Try to keep your hand/arm close to your body during the entire movement. Hold for 10 seconds. Repeat 3 times.       Closed Chain: Shoulder Abduction / Adduction - on Wall    One hand on wall, step to side and return. Stepping causes shoulder to abduct and adduct. Step towards wall until you feel a stretch. Hold for 10 seconds. Repeat 3 times.   http://ss.exer.us/267   Copyright  VHI. All rights reserved.

## 2015-09-13 ENCOUNTER — Ambulatory Visit (HOSPITAL_COMMUNITY): Payer: 59

## 2015-09-13 DIAGNOSIS — M6289 Other specified disorders of muscle: Secondary | ICD-10-CM

## 2015-09-13 DIAGNOSIS — M25611 Stiffness of right shoulder, not elsewhere classified: Secondary | ICD-10-CM

## 2015-09-13 DIAGNOSIS — M25511 Pain in right shoulder: Secondary | ICD-10-CM

## 2015-09-13 DIAGNOSIS — R29898 Other symptoms and signs involving the musculoskeletal system: Secondary | ICD-10-CM

## 2015-09-13 DIAGNOSIS — M629 Disorder of muscle, unspecified: Secondary | ICD-10-CM | POA: Diagnosis not present

## 2015-09-13 NOTE — Therapy (Signed)
[place], [place], [address] Phone: [phone]   Fax:  [phone]  Occupational Therapy Treatment  Patient Details  Name: Dawn Edwards MRN: [id number] Date of Birth: 1965-12-11 Referring Provider: Dr. Netta Cedars, MD  Encounter Date: 09/13/2015      OT End of Session - 09/13/15 1228    Visit Number 30   Number of Visits 32   Date for OT Re-Evaluation 09/29/15   Authorization Type UHC   Authorization Time Period 60 visit limit. Patient has used 0 this year.   Authorization - Visit Number 30   Authorization - Number of Visits 60   OT Start Time 1025   OT Stop Time 1100   OT Time Calculation (min) 35 min   Activity Tolerance Patient tolerated treatment well   Behavior During Therapy WFL for tasks assessed/performed      No past medical history on file.  Past Surgical History  Procedure Laterality Date  . Cesarean section      There were no vitals filed for this visit.  Visit Diagnosis:  Tight fascia  Shoulder weakness  Shoulder joint stiffness, right  Pain in joint, shoulder region, right      Subjective Assessment - 09/13/15 1222    Subjective  S: I did a lot with my arm yesterday.    Currently in Pain? Yes   Pain Score 2    Pain Location Shoulder   Pain Orientation Right   Pain Descriptors / Indicators Sore   Pain Type Acute pain                      OT Treatments/Exercises (OP) - 09/13/15 1050    Exercises   Exercises Shoulder   Shoulder Exercises: Supine   Protraction PROM;5 reps;Strengthening;20 reps   Protraction Weight (lbs) 2   Horizontal ABduction PROM;5 reps;Strengthening;20 reps   Horizontal ABduction Weight (lbs) 2   External Rotation PROM;5 reps;Strengthening;20 reps   External Rotation Weight (lbs) 2   Internal Rotation PROM;5 reps;Strengthening;20 reps   Internal Rotation Weight (lbs) 2   Flexion PROM;5 reps;Strengthening;20 reps   Shoulder Flexion  Weight (lbs) 2   ABduction PROM;5 reps;Strengthening;20 reps   Shoulder ABduction Weight (lbs) 2   Shoulder Exercises: Prone   Other Prone Exercises [place] exercises 10X; 2#   Shoulder Exercises: ROM/Strengthening   Plank 2 reps  30 seconds                  OT Short Term Goals - 07/31/15 1020    OT SHORT TERM GOAL #1   Title Patient will be independent and educated on HEP.   Status On-going   OT SHORT TERM GOAL #2   Title Patient will increase P/ROM to [facility] to increase ability to get shirts on and off with less difficulty.    OT SHORT TERM GOAL #3   Title Patient will decrease fascial restrctions from max to mod amount.    OT SHORT TERM GOAL #4   Title Patient will decrease pain level to 5/10 with daily tasks.   OT SHORT TERM GOAL #5   Title Patient will increase right shoulder strength to 3/5 to increase ability to complete household tasks at home with less difficulty.            OT Long Term Goals - 09/11/15 1222    OT LONG TERM GOAL #1   Title Patient will return to highest level of independence  with all daily, leisure, and work tasks.   Status On-going   OT LONG TERM GOAL #2   Title Patient will increase Right shoulder A/ROM to WNL to increase ability to complete overhead tasks as work.    Status On-going   OT LONG TERM GOAL #3   Title Patient will increase right shoulder strength to 4+/5 to increase ability to complete work related tasks such as lifting, etc.   Status On-going   OT LONG TERM GOAL #4   Title Patient will decrease fascial restrictions from mod to min amount.    OT LONG TERM GOAL #5   Title Patient will decrease pain level to 3/10 or less during daily tasks.    OT LONG TERM GOAL #6   Title Patient will decrease pain in cervical spine to 2/10 or less when completing daily tasks to increase functional mobility needed during daily activities.    Time 4   Period Weeks   Status On-going   OT LONG TERM GOAL #7   Title Patient will increase  cervical ROM to [facility] to increase ability to complete daily and work activities with less difficulty.   Time 4   Period Weeks   Status On-going               Plan - 09/13/15 1229    Clinical Impression Statement A: Increased time with plank hold and patient was able to tolerate. Pt's shoulder flexion shoulder improvement during muscle energy technique.    Plan P: Increase to 3# supine.        Problem List There are no active problems to display for this patient.   Ailene Ravel, OTR/L,CBIS  [phone]  09/13/2015, 12:35 PM  [place] [address] [place], [place], [address] Phone: [phone]   Fax:  [phone]  Name: Dawn Edwards MRN: [id number] Date of Birth: [birth year]

## 2015-09-18 ENCOUNTER — Ambulatory Visit (HOSPITAL_COMMUNITY): Payer: 59

## 2015-09-18 ENCOUNTER — Encounter (HOSPITAL_COMMUNITY): Payer: Self-pay

## 2015-09-18 DIAGNOSIS — M25511 Pain in right shoulder: Secondary | ICD-10-CM

## 2015-09-18 DIAGNOSIS — M629 Disorder of muscle, unspecified: Secondary | ICD-10-CM | POA: Diagnosis not present

## 2015-09-18 DIAGNOSIS — R29898 Other symptoms and signs involving the musculoskeletal system: Secondary | ICD-10-CM

## 2015-09-18 DIAGNOSIS — M25611 Stiffness of right shoulder, not elsewhere classified: Secondary | ICD-10-CM

## 2015-09-18 DIAGNOSIS — M6289 Other specified disorders of muscle: Secondary | ICD-10-CM

## 2015-09-18 NOTE — Therapy (Signed)
[place], [place], [address] Phone: [phone]   Fax:  [phone]  Occupational Therapy Treatment  Patient Details  Name: Dawn Edwards MRN: [id number] Date of Birth: [birth year] Referring Provider: Dr. Netta Cedars, MD  Encounter Date: 09/18/2015      OT End of Session - 09/18/15 1134    Visit Number 31   Number of Visits 32   Date for OT Re-Evaluation 09/29/15   Authorization Type UHC   Authorization Time Period 60 visit limit. Patient has used 0 this year.   Authorization - Visit Number 31   Authorization - Number of Visits 60   OT Start Time 1025   OT Stop Time 1100   OT Time Calculation (min) 35 min   Activity Tolerance Patient tolerated treatment well   Behavior During Therapy WFL for tasks assessed/performed      History reviewed. No pertinent past medical history.  Past Surgical History  Procedure Laterality Date  . Cesarean section      There were no vitals filed for this visit.  Visit Diagnosis:  Tight fascia  Shoulder weakness  Shoulder joint stiffness, right  Pain in joint, shoulder region, right      Subjective Assessment - 09/18/15 1036    Subjective  S: My feels good today.   Currently in Pain? Yes   Pain Score 1    Pain Location Shoulder   Pain Orientation Right   Pain Descriptors / Indicators Sore   Pain Type Acute pain            [facility] OT Assessment - 09/18/15 1038    Assessment   Diagnosis Right rotator cuff repair   Precautions   Precautions Shoulder   Type of Shoulder Precautions Begin strengthening. Progress as tolerated.                  OT Treatments/Exercises (OP) - 09/18/15 1038    Exercises   Exercises Shoulder   Shoulder Exercises: Supine   Protraction PROM;5 reps;Strengthening;10 reps   Protraction Weight (lbs) 3   Horizontal ABduction PROM;5 reps;Strengthening;10 reps   Horizontal ABduction Weight (lbs) 3   External Rotation  PROM;5 reps;Strengthening;10 reps   External Rotation Weight (lbs) 3   Internal Rotation PROM;5 reps;Strengthening;10 reps   Internal Rotation Weight (lbs) 3   Flexion PROM;5 reps;Strengthening;10 reps   Shoulder Flexion Weight (lbs) 3   ABduction PROM;5 reps   Shoulder ABduction Weight (lbs) --   Shoulder Exercises: Standing   Protraction Strengthening;10 reps   Protraction Weight (lbs) 3   Horizontal ABduction Strengthening;Theraband;10 reps   Theraband Level (Shoulder Horizontal ABduction) Level 2 (Red)   Horizontal ABduction Weight (lbs) 3   External Rotation Strengthening;10 reps   External Rotation Weight (lbs) 3   Internal Rotation Strengthening;10 reps   Internal Rotation Weight (lbs) 3   Flexion Strengthening;10 reps   Shoulder Flexion Weight (lbs) 3   ABduction Strengthening;10 reps   Shoulder ABduction Weight (lbs) 3   Extension Theraband;10 reps   Theraband Level (Shoulder Extension) Level 2 (Red)   Other Standing Exercises Push press; 3#; 10X   Other Standing Exercises Bent over row and standing high pull; 3#; 10X   Shoulder Exercises: ROM/Strengthening   Cybex Press 2 plate;15 reps   Cybex Row 2 plate;15 reps   Proximal Shoulder Strengthening, Supine 10X with 3#                  OT Short Term  Goals - 07/31/15 1020    OT SHORT TERM GOAL #1   Title Patient will be independent and educated on HEP.   Status On-going   OT SHORT TERM GOAL #2   Title Patient will increase P/ROM to [facility] to increase ability to get shirts on and off with less difficulty.    OT SHORT TERM GOAL #3   Title Patient will decrease fascial restrctions from max to mod amount.    OT SHORT TERM GOAL #4   Title Patient will decrease pain level to 5/10 with daily tasks.   OT SHORT TERM GOAL #5   Title Patient will increase right shoulder strength to 3/5 to increase ability to complete household tasks at home with less difficulty.            OT Long Term Goals - 09/11/15 1222    OT  LONG TERM GOAL #1   Title Patient will return to highest level of independence with all daily, leisure, and work tasks.   Status On-going   OT LONG TERM GOAL #2   Title Patient will increase Right shoulder A/ROM to WNL to increase ability to complete overhead tasks as work.    Status On-going   OT LONG TERM GOAL #3   Title Patient will increase right shoulder strength to 4+/5 to increase ability to complete work related tasks such as lifting, etc.   Status On-going   OT LONG TERM GOAL #4   Title Patient will decrease fascial restrictions from mod to min amount.    OT LONG TERM GOAL #5   Title Patient will decrease pain level to 3/10 or less during daily tasks.    OT LONG TERM GOAL #6   Title Patient will decrease pain in cervical spine to 2/10 or less when completing daily tasks to increase functional mobility needed during daily activities.    Time 4   Period Weeks   Status On-going   OT LONG TERM GOAL #7   Title Patient will increase cervical ROM to [facility] to increase ability to complete daily and work activities with less difficulty.   Time 4   Period Weeks   Status On-going               Plan - 09/18/15 1135    Clinical Impression Statement A: Added 3# handweights to supine and standing exercises. patient completed all exercises with VC for form and technique. Reports of muscle fatigue and soreness towards end of session.   Plan P: Continue with ball on the wall exercises. Complete scapular stability exercises        Problem List There are no active problems to display for this patient.   Dawn Edwards, OTR/L,CBIS  [phone]  09/18/2015, 11:37 AM  [place] [address] [place], [place], [address] Phone: [phone]   Fax:  [phone]  Name: Dawn Edwards MRN: [id number] Date of Birth: 11-27-1965

## 2015-09-20 ENCOUNTER — Telehealth (HOSPITAL_COMMUNITY): Payer: Self-pay

## 2015-09-20 ENCOUNTER — Ambulatory Visit (HOSPITAL_COMMUNITY): Payer: 59

## 2015-09-20 ENCOUNTER — Encounter (HOSPITAL_COMMUNITY): Payer: Self-pay

## 2015-09-20 DIAGNOSIS — M25611 Stiffness of right shoulder, not elsewhere classified: Secondary | ICD-10-CM

## 2015-09-20 DIAGNOSIS — M629 Disorder of muscle, unspecified: Secondary | ICD-10-CM | POA: Diagnosis not present

## 2015-09-20 DIAGNOSIS — M6289 Other specified disorders of muscle: Secondary | ICD-10-CM

## 2015-09-20 DIAGNOSIS — R29898 Other symptoms and signs involving the musculoskeletal system: Secondary | ICD-10-CM

## 2015-09-20 DIAGNOSIS — M25511 Pain in right shoulder: Secondary | ICD-10-CM

## 2015-09-20 NOTE — Therapy (Signed)
[place], [place], [address] Phone: [phone]   Fax:  [phone]  Occupational Therapy Treatment  Patient Details  Name: Dawn Edwards MRN: [id number] Date of Birth: 1966/07/01 Referring Provider: Dr. Netta Cedars, MD  Encounter Date: 09/20/2015      OT End of Session - 09/20/15 1127    Visit Number 32   Number of Visits 36   Date for OT Re-Evaluation 09/29/15   Authorization Type UHC   Authorization Time Period 60 visit limit. Patient has used 0 this year.   Authorization - Visit Number 32   Authorization - Number of Visits 53   OT Start Time 1015   OT Stop Time 1100   OT Time Calculation (min) 45 min   Activity Tolerance Patient tolerated treatment well   Behavior During Therapy WFL for tasks assessed/performed      History reviewed. No pertinent past medical history.  Past Surgical History  Procedure Laterality Date  . Cesarean section      There were no vitals filed for this visit.  Visit Diagnosis:  Tight fascia  Shoulder weakness  Shoulder joint stiffness, right  Pain in joint, shoulder region, right      Subjective Assessment - 09/20/15 1037    Subjective  S: My shoulder is sore today I didn't do anything.    Currently in Pain? Yes   Pain Score 2    Pain Location Shoulder   Pain Orientation Right   Pain Descriptors / Indicators Sore            [facility] OT Assessment - 09/20/15 1043    Assessment   Diagnosis Right rotator cuff repair   Precautions   Precautions Shoulder   Type of Shoulder Precautions Begin strengthening. Progress as tolerated.                  OT Treatments/Exercises (OP) - 09/20/15 1041    Exercises   Exercises Shoulder   Shoulder Exercises: Supine   Protraction PROM;5 reps;Strengthening;10 reps   Protraction Weight (lbs) 3   Horizontal ABduction PROM;5 reps;Strengthening;10 reps   Horizontal ABduction Weight (lbs) 3   External Rotation  PROM;5 reps;Strengthening;10 reps   External Rotation Weight (lbs) 3   Internal Rotation PROM;5 reps;Strengthening;10 reps   Internal Rotation Weight (lbs) 3   Flexion PROM;5 reps;Strengthening;10 reps   Shoulder Flexion Weight (lbs) 3   ABduction PROM;5 reps   Shoulder Exercises: Standing   Protraction Strengthening;10 reps   Protraction Weight (lbs) 3   Horizontal ABduction Strengthening;Theraband;10 reps   Theraband Level (Shoulder Horizontal ABduction) Level 2 (Red)   Horizontal ABduction Weight (lbs) 3   External Rotation Strengthening;10 reps   External Rotation Weight (lbs) 3   Internal Rotation Strengthening;10 reps   Internal Rotation Weight (lbs) 3   Flexion Strengthening;10 reps   Shoulder Flexion Weight (lbs) 3   ABduction Strengthening;10 reps   Shoulder ABduction Weight (lbs) 3   Shoulder Exercises: ROM/Strengthening   Cybex Press 2 plate;15 reps   Cybex Row 2 plate;15 reps   "W" Arms 10X with 3#   Proximal Shoulder Strengthening, Supine 10X with 3#   Proximal Shoulder Strengthening, Seated 10X with 3# no rest breaks   Ball on Wall 1' flexion 1' abduction with green ball   Manual Therapy   Manual Therapy Myofascial release;Manual Traction   Myofascial Release Myofascial release to right upper arm, trapezious, and scapularis region to decrease fascial restrictions and increase joint mobility  in a pain free zone.    Manual Traction Manual traction completed to cervical region to decrease pain and muscle tightness and increase joint mobility.                   OT Short Term Goals - 07/31/15 1020    OT SHORT TERM GOAL #1   Title Patient will be independent and educated on HEP.   Status On-going   OT SHORT TERM GOAL #2   Title Patient will increase P/ROM to [facility] to increase ability to get shirts on and off with less difficulty.    OT SHORT TERM GOAL #3   Title Patient will decrease fascial restrctions from max to mod amount.    OT SHORT TERM GOAL #4    Title Patient will decrease pain level to 5/10 with daily tasks.   OT SHORT TERM GOAL #5   Title Patient will increase right shoulder strength to 3/5 to increase ability to complete household tasks at home with less difficulty.            OT Long Term Goals - 09/11/15 1222    OT LONG TERM GOAL #1   Title Patient will return to highest level of independence with all daily, leisure, and work tasks.   Status On-going   OT LONG TERM GOAL #2   Title Patient will increase Right shoulder A/ROM to WNL to increase ability to complete overhead tasks as work.    Status On-going   OT LONG TERM GOAL #3   Title Patient will increase right shoulder strength to 4+/5 to increase ability to complete work related tasks such as lifting, etc.   Status On-going   OT LONG TERM GOAL #4   Title Patient will decrease fascial restrictions from mod to min amount.    OT LONG TERM GOAL #5   Title Patient will decrease pain level to 3/10 or less during daily tasks.    OT LONG TERM GOAL #6   Title Patient will decrease pain in cervical spine to 2/10 or less when completing daily tasks to increase functional mobility needed during daily activities.    Time 4   Period Weeks   Status On-going   OT LONG TERM GOAL #7   Title Patient will increase cervical ROM to [facility] to increase ability to complete daily and work activities with less difficulty.   Time 4   Period Weeks   Status On-going               Plan - 09/20/15 1128    Clinical Impression Statement A: Pt required min VC to complete exercises for form and technique. Increased ROM achieved with shoulder flexion with the use of muscle energy technique.   Plan P: Increase repetitions during supine and standing.         Problem List There are no active problems to display for this patient.   Ailene Ravel, OTR/L,CBIS  [phone]  09/20/2015, 11:33 AM  [place] [address] [place],  [place], [address] Phone: [phone]   Fax:  [phone]  Name: Dawn Edwards MRN: [id number] Date of Birth: 12/15/1965

## 2015-09-20 NOTE — Telephone Encounter (Signed)
reasses - She will see the MD and let us know if she will continue, she is doing better and feels she is ready to go back to work

## 2015-09-25 ENCOUNTER — Encounter (HOSPITAL_COMMUNITY): Payer: Self-pay

## 2015-09-25 ENCOUNTER — Ambulatory Visit (HOSPITAL_COMMUNITY): Payer: 59

## 2015-09-25 DIAGNOSIS — M25611 Stiffness of right shoulder, not elsewhere classified: Secondary | ICD-10-CM

## 2015-09-25 DIAGNOSIS — R29898 Other symptoms and signs involving the musculoskeletal system: Secondary | ICD-10-CM

## 2015-09-25 DIAGNOSIS — M629 Disorder of muscle, unspecified: Secondary | ICD-10-CM | POA: Diagnosis not present

## 2015-09-25 DIAGNOSIS — M25511 Pain in right shoulder: Secondary | ICD-10-CM

## 2015-09-25 DIAGNOSIS — M6289 Other specified disorders of muscle: Secondary | ICD-10-CM

## 2015-09-25 NOTE — Therapy (Signed)
[place], [place], [address] Phone: [phone]   Fax:  [phone]  Occupational Therapy Treatment and reassessment  Patient Details  Name: Dawn Edwards MRN: [id number] Date of Birth: 1966/08/25 Referring Provider: Dr. Netta Cedars, MD  Encounter Date: 09/25/2015      OT End of Session - 09/25/15 1155    Visit Number 33   Number of Visits 36   Date for OT Re-Evaluation 09/29/15   Authorization Type [employer]   Authorization Time Period 60 visit limit. Patient has used 0 this year.   Authorization - Visit Number 33   Authorization - Number of Visits 60   OT Start Time 1100   OT Stop Time 1138   OT Time Calculation (min) 38 min   Activity Tolerance Patient tolerated treatment well   Behavior During Therapy WFL for tasks assessed/performed      History reviewed. No pertinent past medical history.  Past Surgical History  Procedure Laterality Date  . Cesarean section      There were no vitals filed for this visit.  Visit Diagnosis:  Tight fascia  Shoulder weakness  Shoulder joint stiffness, right  Pain in joint, shoulder region, right      Subjective Assessment - 09/25/15 1154    Subjective  S: I am ready to go back to work. I know I'm not 100% yet but I'm better than I was.    Special Tests FOTO score: 73/100   Currently in Pain? Yes   Pain Score 1    Pain Location Shoulder   Pain Orientation Right   Pain Descriptors / Indicators Tightness   Pain Type Acute pain            [facility] OT Assessment - 09/25/15 1111    Assessment   Diagnosis Right rotator cuff repair   Precautions   Precautions Shoulder   Type of Shoulder Precautions Begin strengthening. Progress as tolerated.   AROM   Overall AROM Comments Assessed standing. er/IR adducted    AROM Assessment Site Shoulder   Right/Left Shoulder Right   Right Shoulder Flexion 160 Degrees  previous: 148   Right Shoulder ABduction 160 Degrees   previous: 130   Right Shoulder Internal Rotation 90 Degrees  previous: 90   Right Shoulder External Rotation 70 Degrees  previous: 64   Strength   Overall Strength Comments Assessed standing. er/IR adducted.    Strength Assessment Site Shoulder   Right/Left Shoulder Right   Right Shoulder Flexion 4+/5  previous: 4/5   Right Shoulder ABduction 4+/5  previous: 4/5   Right Shoulder Internal Rotation 4-/5  previous: 3+/5   Right Shoulder External Rotation 5/5  previous: 5/5                  OT Treatments/Exercises (OP) - 09/25/15 1153    Exercises   Exercises Shoulder   Shoulder Exercises: Supine   Protraction PROM;5 reps   Horizontal ABduction PROM;5 reps   External Rotation PROM;5 reps   Internal Rotation PROM;5 reps   Flexion PROM;5 reps   ABduction PROM;5 reps   Shoulder Exercises: Standing   Horizontal ABduction Theraband;10 reps   Theraband Level (Shoulder Horizontal ABduction) Level 3 (Green)   External Rotation Theraband;10 reps   Theraband Level (Shoulder External Rotation) Level 3 (Green)   Internal Rotation Theraband;10 reps   Theraband Level (Shoulder Internal Rotation) Level 3 (Green)   ABduction Theraband;10 reps   Theraband Level (Shoulder ABduction) Level 3 (  Green)   Manual Therapy   Manual Therapy Myofascial release;Manual Traction   Myofascial Release Myofascial release to right upper arm, trapezious, and scapularis region to decrease fascial restrictions and increase joint mobility in a pain free zone.    Manual Traction Manual traction completed to cervical region to decrease pain and muscle tightness and increase joint mobility.                 OT Education - 09/25/15 1155    Education provided Yes   Education Details green theraband strengthening shoulder exercises   Person(s) Educated Patient   Methods Explanation;Demonstration;Handout   Comprehension Returned demonstration;Verbalized understanding          OT Short Term  Goals - 09/25/15 1156    OT SHORT TERM GOAL #1   Title Patient will be independent and educated on HEP.   Status Achieved   OT SHORT TERM GOAL #2   Title Patient will increase P/ROM to [facility] to increase ability to get shirts on and off with less difficulty.    OT SHORT TERM GOAL #3   Title Patient will decrease fascial restrctions from max to mod amount.    OT SHORT TERM GOAL #4   Title Patient will decrease pain level to 5/10 with daily tasks.   OT SHORT TERM GOAL #5   Title Patient will increase right shoulder strength to 3/5 to increase ability to complete household tasks at home with less difficulty.            OT Long Term Goals - 09/25/15 1156    OT LONG TERM GOAL #1   Title Patient will return to highest level of independence with all daily, leisure, and work tasks.   Status Achieved   OT LONG TERM GOAL #2   Title Patient will increase Right shoulder A/ROM to WNL to increase ability to complete overhead tasks as work.    Status Achieved   OT LONG TERM GOAL #3   Title Patient will increase right shoulder strength to 4+/5 to increase ability to complete work related tasks such as lifting, etc.   Status Achieved   OT LONG TERM GOAL #4   Title Patient will decrease fascial restrictions from mod to min amount.    Status Achieved   OT LONG TERM GOAL #5   Title Patient will decrease pain level to 3/10 or less during daily tasks.    Status Achieved   OT LONG TERM GOAL #6   Title Patient will decrease pain in cervical spine to 2/10 or less when completing daily tasks to increase functional mobility needed during daily activities.    Time 4   Period Weeks   Status Achieved   OT LONG TERM GOAL #7   Title Patient will increase cervical ROM to [facility] to increase ability to complete daily and work activities with less difficulty.   Time 4   Period Weeks   Status Achieved               Plan - 09/25/15 1156    Clinical Impression Statement A: Reassessment completed this date.  patient has shown great improvement with all ROM and strengthening measurements. patient has met all therapy goals and is ready to return to work next week. patient was given an updated HEP and is in agreement with discharge.   Plan P: D/C with HEP.     OCCUPATIONAL THERAPY DISCHARGE SUMMARY  Visits from Start of Care: 19  Current functional level related to  goals / functional outcomes: See goals above   Remaining deficits: None. Patient has met all therapy goals.    Education / Equipment: Shoulder strengthening exercises Plan: Patient agrees to discharge.  Patient goals were met. Patient is being discharged due to meeting the stated rehab goals.  ?????        Problem List There are no active problems to display for this patient.   Ailene Ravel, OTR/L,CBIS  [phone]  09/25/2015, 11:59 AM  [place], [place], [address] Phone: [phone]   Fax:  [phone]  Name: SELEENA REIMERS MRN: [id number] Date of Birth: [birth year]

## 2015-09-25 NOTE — Patient Instructions (Addendum)
Complete 15 times. 1-2X a day.  Strengthening: Chest Pull - Resisted   Hold Theraband in front of body with hands about shoulder width a part. Pull band a part and back together slowly. Repeat ____ times. Complete ____ set(s) per session.. Repeat ____ session(s) per day.  http://orth.exer.us/926   Copyright  VHI. All rights reserved.   PNF Strengthening: Resisted   Standing with resistive band around each hand, bring right arm up and away, thumb back. Repeat ____ times per set. Do ____ sets per session. Do ____ sessions per day.   Resisted External Rotation: in Neutral - Bilateral   Sit or stand, tubing in both hands, elbows at sides, bent to 90, forearms forward. Pinch shoulder blades together and rotate forearms out. Keep elbows at sides. Repeat ____ times per set. Do ____ sets per session. Do ____ sessions per day.  http://orth.exer.us/966   Copyright  VHI. All rights reserved.   PNF Strengthening: Resisted   Standing, hold resistive band above head. Bring right arm down and out from side. Repeat ____ times per set. Do ____ sets per session. Do ____ sessions per day.  http://orth.exer.us/922   Copyright  VHI. All rights reserved.

## 2015-09-27 ENCOUNTER — Encounter (HOSPITAL_COMMUNITY): Payer: 59

## 2015-09-28 ENCOUNTER — Encounter (HOSPITAL_COMMUNITY): Payer: 59 | Admitting: Occupational Therapy

## 2015-10-02 ENCOUNTER — Encounter (HOSPITAL_COMMUNITY): Payer: Self-pay

## 2015-10-03 ENCOUNTER — Encounter (HOSPITAL_COMMUNITY): Payer: Self-pay

## 2016-01-21 ENCOUNTER — Other Ambulatory Visit: Payer: Self-pay

## 2016-01-21 DIAGNOSIS — Z1231 Encounter for screening mammogram for malignant neoplasm of breast: Secondary | ICD-10-CM

## 2016-01-30 ENCOUNTER — Ambulatory Visit
Admission: RE | Admit: 2016-01-30 | Discharge: 2016-01-30 | Disposition: A | Discharge status: Home / Self Care | Payer: 59 | Source: Ambulatory Visit

## 2016-01-30 DIAGNOSIS — Z1231 Encounter for screening mammogram for malignant neoplasm of breast: Secondary | ICD-10-CM

## 2017-03-27 DIAGNOSIS — J329 Chronic sinusitis, unspecified: Secondary | ICD-10-CM | POA: Diagnosis not present

## 2017-07-10 DIAGNOSIS — Z0001 Encounter for general adult medical examination with abnormal findings: Secondary | ICD-10-CM | POA: Diagnosis not present

## 2017-08-10 ENCOUNTER — Telehealth: Payer: Self-pay | Admitting: General Practice

## 2017-08-10 NOTE — Telephone Encounter (Signed)
Patient called to schedule her first ever screening tcs.   She is currently not having any GI problems and she's not on any blood thinners.   She can be reached at [phone].  Routing to [employer]

## 2017-08-11 NOTE — Telephone Encounter (Signed)
LMOM to call.

## 2017-08-14 NOTE — Telephone Encounter (Signed)
Letter to call.

## 2017-09-10 DIAGNOSIS — Z1231 Encounter for screening mammogram for malignant neoplasm of breast: Secondary | ICD-10-CM | POA: Diagnosis not present

## 2017-11-13 DIAGNOSIS — I10 Essential (primary) hypertension: Secondary | ICD-10-CM | POA: Diagnosis not present

## 2017-11-13 DIAGNOSIS — Z6831 Body mass index (BMI) 31.0-31.9, adult: Secondary | ICD-10-CM | POA: Diagnosis not present

## 2017-11-13 DIAGNOSIS — E6609 Other obesity due to excess calories: Secondary | ICD-10-CM | POA: Diagnosis not present

## 2017-11-25 DIAGNOSIS — G4700 Insomnia, unspecified: Secondary | ICD-10-CM | POA: Diagnosis not present

## 2017-11-25 DIAGNOSIS — R531 Weakness: Secondary | ICD-10-CM | POA: Diagnosis not present

## 2017-11-30 ENCOUNTER — Other Ambulatory Visit (HOSPITAL_COMMUNITY): Payer: Self-pay | Admitting: Internal Medicine

## 2017-11-30 ENCOUNTER — Ambulatory Visit (HOSPITAL_COMMUNITY)
Admission: RE | Admit: 2017-11-30 | Discharge: 2017-11-30 | Disposition: A | Discharge status: Home / Self Care | Payer: 59 | Source: Ambulatory Visit | Attending: Internal Medicine | Admitting: Internal Medicine

## 2017-11-30 DIAGNOSIS — R61 Generalized hyperhidrosis: Secondary | ICD-10-CM | POA: Diagnosis not present

## 2017-11-30 DIAGNOSIS — R55 Syncope and collapse: Secondary | ICD-10-CM | POA: Diagnosis not present

## 2017-11-30 DIAGNOSIS — R0600 Dyspnea, unspecified: Secondary | ICD-10-CM | POA: Diagnosis not present

## 2017-11-30 DIAGNOSIS — R0602 Shortness of breath: Secondary | ICD-10-CM | POA: Diagnosis not present

## 2017-11-30 DIAGNOSIS — R0609 Other forms of dyspnea: Secondary | ICD-10-CM | POA: Diagnosis not present

## 2017-11-30 DIAGNOSIS — R109 Unspecified abdominal pain: Secondary | ICD-10-CM | POA: Diagnosis not present

## 2017-12-03 ENCOUNTER — Emergency Department (HOSPITAL_COMMUNITY): Payer: 59

## 2017-12-03 ENCOUNTER — Emergency Department (HOSPITAL_COMMUNITY)
Admission: EM | Admit: 2017-12-03 | Discharge: 2017-12-03 | Disposition: A | Discharge status: Home / Self Care | Payer: 59 | Attending: Emergency Medicine | Admitting: Emergency Medicine

## 2017-12-03 ENCOUNTER — Encounter (HOSPITAL_COMMUNITY): Payer: Self-pay

## 2017-12-03 DIAGNOSIS — R109 Unspecified abdominal pain: Secondary | ICD-10-CM | POA: Diagnosis present

## 2017-12-03 DIAGNOSIS — R1013 Epigastric pain: Secondary | ICD-10-CM | POA: Insufficient documentation

## 2017-12-03 DIAGNOSIS — I10 Essential (primary) hypertension: Secondary | ICD-10-CM | POA: Insufficient documentation

## 2017-12-03 DIAGNOSIS — F17290 Nicotine dependence, other tobacco product, uncomplicated: Secondary | ICD-10-CM | POA: Insufficient documentation

## 2017-12-03 DIAGNOSIS — Z79899 Other long term (current) drug therapy: Secondary | ICD-10-CM | POA: Insufficient documentation

## 2017-12-03 DIAGNOSIS — K439 Ventral hernia without obstruction or gangrene: Secondary | ICD-10-CM | POA: Diagnosis not present

## 2017-12-03 HISTORY — DX: Essential (primary) hypertension: I10

## 2017-12-03 LAB — COMPREHENSIVE METABOLIC PANEL
ALT: 20 U/L (ref 14–54)
ANION GAP: 11 (ref 5–15)
AST: 17 U/L (ref 15–41)
Albumin: 4.4 g/dL (ref 3.5–5.0)
Alkaline Phosphatase: 83 U/L (ref 38–126)
BUN: 16 mg/dL (ref 6–20)
CHLORIDE: 103 mmol/L (ref 101–111)
CO2: 22 mmol/L (ref 22–32)
CREATININE: 0.84 mg/dL (ref 0.44–1.00)
Calcium: 9.7 mg/dL (ref 8.9–10.3)
GFR calc non Af Amer: >60 mL/min (ref >60)
Glucose, Bld: 103 mg/dL — ABNORMAL HIGH (ref 65–99)
POTASSIUM: 3.6 mmol/L (ref 3.5–5.1)
Sodium: 136 mmol/L (ref 135–145)
Total Bilirubin: 0.8 mg/dL (ref 0.3–1.2)
Total Protein: 7.6 g/dL (ref 6.5–8.1)

## 2017-12-03 LAB — PREGNANCY, URINE: Preg Test, Ur: NEGATIVE

## 2017-12-03 LAB — URINALYSIS, ROUTINE W REFLEX MICROSCOPIC
Bilirubin Urine: NEGATIVE
Glucose, UA: NEGATIVE mg/dL
HGB URINE DIPSTICK: NEGATIVE
Ketones, ur: 5 mg/dL — AB
LEUKOCYTES UA: NEGATIVE
Nitrite: NEGATIVE
PROTEIN: NEGATIVE mg/dL
SPECIFIC GRAVITY, URINE: 1.025 (ref 1.005–1.030)
pH: 5 (ref 5.0–8.0)

## 2017-12-03 LAB — CBC
HCT: 43.6 % (ref 36.0–46.0)
HEMOGLOBIN: 14.5 g/dL (ref 12.0–15.0)
MCH: 29.5 pg (ref 26.0–34.0)
MCHC: 33.3 g/dL (ref 30.0–36.0)
MCV: 88.6 fL (ref 78.0–100.0)
PLATELETS: 445 10*3/uL — AB (ref 150–400)
RBC: 4.92 MIL/uL (ref 3.87–5.11)
RDW: 11.8 % (ref 11.5–15.5)
WBC: 10.9 10*3/uL — AB (ref 4.0–10.5)

## 2017-12-03 LAB — LIPASE, BLOOD: LIPASE: 37 U/L (ref 11–51)

## 2017-12-03 MED ORDER — PANTOPRAZOLE SODIUM 40 MG IV SOLR
40.0000 mg | Freq: Once | INTRAVENOUS | Status: AC
  Administered 2017-12-03: 40 mg via INTRAVENOUS
  Filled 2017-12-03: qty 40

## 2017-12-03 MED ORDER — ONDANSETRON HCL 4 MG/2ML IJ SOLN
4.0000 mg | Freq: Once | INTRAMUSCULAR | Status: AC
  Administered 2017-12-03: 4 mg via INTRAVENOUS
  Filled 2017-12-03: qty 2

## 2017-12-03 MED ORDER — FAMOTIDINE 20 MG PO TABS: 20.0000 mg | ORAL_TABLET | Freq: Two times a day (BID) | ORAL | 0 refills | Status: DC

## 2017-12-03 MED ORDER — SODIUM CHLORIDE 0.9 % IV SOLN
INTRAVENOUS | Status: DC
  Administered 2017-12-03: 14:00:00 via INTRAVENOUS

## 2017-12-03 MED ORDER — SODIUM CHLORIDE 0.9 % IV BOLUS (SEPSIS)
2000.0000 mL | Freq: Once | INTRAVENOUS | Status: AC
  Administered 2017-12-03: 2000 mL via INTRAVENOUS

## 2017-12-03 MED ORDER — IOPAMIDOL (ISOVUE-300) INJECTION 61%
100.0000 mL | Freq: Once | INTRAVENOUS | Status: AC | PRN
  Administered 2017-12-03: 100 mL via INTRAVENOUS

## 2017-12-03 NOTE — ED Provider Notes (Signed)
[facility] EMERGENCY DEPARTMENT Provider Note   CSN: [id number] Arrival date & time: 12/03/17  9924     History   Chief Complaint Chief Complaint  Patient presents with  . Abdominal Pain    HPI Dawn Edwards is a 52 y.o. female.  Patient with 3-week history of several complaints.  Epigastric abdominal pain nausea difficulty sleeping weight loss over the past month.  Has been seen twice by her primary care provider for this.  There was seen on Monday most recently and had lab work done.  Does not know the results.  Patient occasionally has some loose bowel movements.  Nausea but no vomiting.  Stomach worse after eating.  Has no appetite.  Has lost 20 pounds since December.      Past Medical History:  Diagnosis Date  . Hypertension     There are no active problems to display for this patient.   Past Surgical History:  Procedure Laterality Date  . CESAREAN SECTION      OB History    No data available       Home Medications    Prior to Admission medications   Medication Sig Start Date End Date Taking? Authorizing Provider  ibuprofen (ADVIL,MOTRIN) 200 MG tablet Take 600 mg by mouth every 6 (six) hours as needed for pain.   Yes [provider]  olmesartan (BENICAR) 40 MG tablet Take 40 mg by mouth daily.   Yes [provider]  zolpidem (AMBIEN) 5 MG tablet Take 5-10 mg by mouth at bedtime as needed for sleep.   Yes [provider]  famotidine (PEPCID) 20 MG tablet Take 1 tablet (20 mg total) by mouth 2 (two) times daily. 12/03/17   Fredia Sorrow, MD    Family History No family history on file.  Social History Social History   Tobacco Use  . Smoking status: Current Every Day Smoker    Types: E-cigarettes  . Smokeless tobacco: Never Used  Substance Use Topics  . Alcohol use: No  . Drug use: No     Allergies   Patient has no known allergies.   Review of Systems Review of Systems  Constitutional: Positive for fatigue and  unexpected weight change. Negative for fever.  HENT: Positive for congestion.   Eyes: Negative for redness.  Respiratory: Negative for shortness of breath.   Cardiovascular: Negative for chest pain.  Gastrointestinal: Positive for abdominal pain, diarrhea and nausea. Negative for blood in stool and vomiting.  Genitourinary: Negative for dysuria and hematuria.  Musculoskeletal: Negative for back pain.  Skin: Negative for rash.  Neurological: Positive for light-headedness. Negative for headaches.  Hematological: Does not bruise/bleed easily.  Psychiatric/Behavioral: Positive for sleep disturbance. Negative for confusion.     Physical Exam Updated Vital Signs BP 124/74 (BP Location: Left Arm)   Pulse (!) 53   Temp 98.7 F (37.1 C) (Oral)   Resp 18   Ht 1.676 m (5\' 6" )   Wt 81.2 kg (179 lb)   LMP 12/03/2016 Comment: pt says periods stopped last jan  SpO2 99%   BMI 28.89 kg/m   Physical Exam  Constitutional: She is oriented to person, place, and time. She appears well-developed and well-nourished. No distress.  HENT:  Head: Normocephalic and atraumatic.  Mucous membranes dry.  Eyes: Conjunctivae and EOM are normal. Pupils are equal, round, and reactive to light.  Neck: Normal range of motion. Neck supple.  Cardiovascular: Normal rate, regular rhythm and normal heart sounds.  Pulmonary/Chest: Effort normal  and breath sounds normal. No respiratory distress.  Abdominal: Soft. Bowel sounds are normal.  Mild epigastric tenderness no guarding  Musculoskeletal: Normal range of motion. She exhibits no edema.  Neurological: She is alert and oriented to person, place, and time. No cranial nerve deficit or sensory deficit. She exhibits normal muscle tone. Coordination normal.  Skin: Skin is warm. No rash noted.  Nursing note and vitals reviewed.    ED Treatments / Results  Labs (all labs ordered are listed, but only abnormal results are displayed) Labs Reviewed  COMPREHENSIVE  METABOLIC PANEL - Abnormal; Notable for the following components:      Result Value   Glucose, Bld 103 (*)    All other components within normal limits  CBC - Abnormal; Notable for the following components:   WBC 10.9 (*)    Platelets 445 (*)    All other components within normal limits  URINALYSIS, ROUTINE W REFLEX MICROSCOPIC - Abnormal; Notable for the following components:   Color, Urine AMBER (*)    Ketones, ur 5 (*)    All other components within normal limits  LIPASE, BLOOD  PREGNANCY, URINE    EKG  EKG Interpretation None       Radiology Ct Abdomen Pelvis W Contrast  Result Date: 12/03/2017 CLINICAL DATA:  Nausea with epigastric region pain and weight loss EXAM: CT ABDOMEN AND PELVIS WITH CONTRAST TECHNIQUE: Multidetector CT imaging of the abdomen and pelvis was performed using the standard protocol following bolus administration of intravenous contrast. CONTRAST:  120mL ISOVUE-300 IOPAMIDOL (ISOVUE-300) INJECTION 61% COMPARISON:  CT abdomen and pelvis March 03, 2004 FINDINGS: Lower chest: There is mild atelectatic change in the inferior lingula. Lung bases otherwise are clear. Hepatobiliary: Liver measures 19.1 cm in length. There are scattered subcentimeter probable cysts throughout the liver. There is a cyst in the anterior segment right lobe of the liver near the gallbladder fossa measuring 1.5 x 1.3 cm. Gallbladder wall is not appreciably thickened. There is no biliary duct dilatation. Pancreas: No pancreatic mass or inflammatory focus. Spleen: No splenic lesions are evident. Adrenals/Urinary Tract: Adrenals bilaterally appear normal. Kidneys bilaterally show no evident mass or hydronephrosis on either side. There is no evident renal or ureteral calculus on either side. Urinary bladder is midline with wall thickness within normal limits. Stomach/Bowel: There is no appreciable bowel wall or mesenteric thickening. No bowel obstruction. No free air or portal venous air.  Vascular/Lymphatic: There is no abdominal aortic aneurysm. There are foci of aortic atherosclerosis. Major mesenteric vessels appear patent. No adenopathy is appreciable in the abdomen or pelvis. Reproductive: The uterus is anteverted. The uterus has a somewhat inhomogeneous appearance suggesting potential underlying leiomyomatous change. There is a 2.7 x 2.2 cm likely dominant follicle left ovary. No other extrauterine pelvic mass evident. Other: Appendix is not appreciable. No periappendiceal region inflammation evident. No abscess or ascites is evident in the abdomen or pelvis. There is a minimal ventral hernia containing only fat. Musculoskeletal: There are no blastic or lytic bone lesions. No intramuscular or abdominal wall lesion evident. IMPRESSION: 1. No bowel obstruction. No abscess. Appendix not seen; no periappendiceal region inflammation evident. 2.  No renal or ureteral calculus.  No hydronephrosis. 3. Uterus has a somewhat inhomogeneous appearance. Question underlying leiomyomatous change within the uterus. 4.  Foci of aortic atherosclerosis noted. 5.  Minimal ventral hernia containing only fat. Aortic Atherosclerosis (ICD10-I70.0). Electronically Signed   By: Lowella Grip III M.D.   On: 12/03/2017 15:53    Procedures Procedures (  including critical care time)  Medications Ordered in ED Medications  0.9 %  sodium chloride infusion ( Intravenous New Bag/Given 12/03/17 1348)  sodium chloride 0.9 % bolus 2,000 mL (2,000 mLs Intravenous New Bag/Given 12/03/17 1348)  pantoprazole (PROTONIX) injection 40 mg (40 mg Intravenous Given 12/03/17 1332)  ondansetron (ZOFRAN) injection 4 mg (4 mg Intravenous Given 12/03/17 1332)  iopamidol (ISOVUE-300) 61 % injection 100 mL (100 mLs Intravenous Contrast Given 12/03/17 1531)     Initial Impression / Assessment and Plan / ED Course  I have reviewed the triage vital signs and the nursing notes.  Pertinent labs & imaging results that were available  during my care of the patient were reviewed by me and considered in my medical decision making (see chart for details).    Workup included CT scan of the abdomen pelvis without any acute findings.  Patient also received IV fluids for hydration and felt better after that.  Symptoms could be related to peptic ulcer disease.  Will treat with Pepcid for the next 2 weeks.  Gave referral to gastroenterology.  Some of patient's symptoms are not explained and primary care doctor will need to continue to evaluate this further.  1 of those is been her sleep disturbances.  Patient nontoxic no acute distress.   Final Clinical Impressions(s) / ED Diagnoses   Final diagnoses:  Epigastric pain    ED Discharge Orders        Ordered    famotidine (PEPCID) 20 MG tablet  2 times daily     12/03/17 1636       Fredia Sorrow, MD 12/03/17 1705

## 2017-12-03 NOTE — ED Triage Notes (Signed)
Pt reports epigastric pain, nausea, and weight loss over the past month.  Reports had blood work drawn Monday but doesn't know the results.  Also says Dr. Gerarda Fraction wants to do an abd ct scan.  Pt reports some diarrhea, no vomiting.  Reports stomach, "knots up" after eating.  Pt has lost 20lbs since dec 1.

## 2017-12-03 NOTE — ED Notes (Signed)
Advised patient we needed urine specimen.  She stated unable to provide now, but will let us know.

## 2017-12-03 NOTE — Discharge Instructions (Signed)
Follow-up with [facility] for further evaluation.  Recommend taking the Pepcid for the next 2 weeks.  Referral information to gastroenterology provided.

## 2017-12-04 DIAGNOSIS — E748 Other specified disorders of carbohydrate metabolism: Secondary | ICD-10-CM | POA: Diagnosis not present

## 2017-12-07 ENCOUNTER — Encounter: Payer: Self-pay | Admitting: Internal Medicine

## 2017-12-08 DIAGNOSIS — L989 Disorder of the skin and subcutaneous tissue, unspecified: Secondary | ICD-10-CM | POA: Diagnosis not present

## 2017-12-08 DIAGNOSIS — H02409 Unspecified ptosis of unspecified eyelid: Secondary | ICD-10-CM | POA: Diagnosis not present

## 2018-01-01 DIAGNOSIS — B078 Other viral warts: Secondary | ICD-10-CM | POA: Diagnosis not present

## 2018-01-01 DIAGNOSIS — L570 Actinic keratosis: Secondary | ICD-10-CM | POA: Diagnosis not present

## 2018-01-01 DIAGNOSIS — X32XXXA Exposure to sunlight, initial encounter: Secondary | ICD-10-CM | POA: Diagnosis not present

## 2018-01-01 DIAGNOSIS — Z1283 Encounter for screening for malignant neoplasm of skin: Secondary | ICD-10-CM | POA: Diagnosis not present

## 2018-01-01 DIAGNOSIS — D225 Melanocytic nevi of trunk: Secondary | ICD-10-CM | POA: Diagnosis not present

## 2018-01-04 DIAGNOSIS — F951 Chronic motor or vocal tic disorder: Secondary | ICD-10-CM | POA: Diagnosis not present

## 2018-01-04 DIAGNOSIS — Z01419 Encounter for gynecological examination (general) (routine) without abnormal findings: Secondary | ICD-10-CM | POA: Diagnosis not present

## 2018-01-08 ENCOUNTER — Ambulatory Visit: Payer: 59 | Admitting: Gastroenterology

## 2018-01-11 ENCOUNTER — Encounter: Payer: Self-pay | Admitting: Internal Medicine

## 2018-03-11 ENCOUNTER — Encounter: Payer: Self-pay | Admitting: Nurse Practitioner

## 2018-03-11 ENCOUNTER — Other Ambulatory Visit: Payer: Self-pay | Admitting: *Deleted

## 2018-03-11 ENCOUNTER — Ambulatory Visit (INDEPENDENT_AMBULATORY_CARE_PROVIDER_SITE_OTHER): Payer: 59 | Admitting: Nurse Practitioner

## 2018-03-11 ENCOUNTER — Encounter: Payer: Self-pay | Admitting: *Deleted

## 2018-03-11 ENCOUNTER — Telehealth: Payer: Self-pay | Admitting: *Deleted

## 2018-03-11 DIAGNOSIS — R1030 Lower abdominal pain, unspecified: Secondary | ICD-10-CM

## 2018-03-11 DIAGNOSIS — R195 Other fecal abnormalities: Secondary | ICD-10-CM | POA: Diagnosis not present

## 2018-03-11 DIAGNOSIS — Z1211 Encounter for screening for malignant neoplasm of colon: Secondary | ICD-10-CM

## 2018-03-11 DIAGNOSIS — R109 Unspecified abdominal pain: Secondary | ICD-10-CM | POA: Insufficient documentation

## 2018-03-11 MED ORDER — NA SULFATE-K SULFATE-MG SULF 17.5-3.13-1.6 GM/177ML PO SOLN: 1.0000 | ORAL | 0 refills | Status: DC

## 2018-03-11 NOTE — Assessment & Plan Note (Addendum)
The patient describes loose stools postprandially, typically has 2-3 a day.  Mostly occurs after eating.  This is in association with abdominal pain as per below.  Not explosive or watery, doubt occult infection at this point.  Given her stool changes in the presence of abdominal pain which relieves after bowel movement seems most likely IBS.  Cannot rule out other more insidious pathology.  She is currently due for colonoscopy.  We will plan to proceed with first ever colonoscopy for evaluation.  Follow-up in 3 months.  Proceed with TCS with Dr. Gala Romney in near future: the risks, benefits, and alternatives have been discussed with the patient in detail. The patient states understanding and desires to proceed.  The patient is not on any anticoagulants, anxiolytics, chronic pain medications, or antidepressants.  Denies alcohol and drug use.  Conscious sedation should be adequate for her procedure.

## 2018-03-11 NOTE — Telephone Encounter (Signed)
PA for TCS was approved via [facility] website. Auth # [id number]

## 2018-03-11 NOTE — Progress Notes (Signed)
cc'ed to pcp °

## 2018-03-11 NOTE — Assessment & Plan Note (Signed)
Lower abdominal pain, described as sharp in association with loose stools.  Typically occurs 3 times a day postprandially.  Most consistent with IBS diarrhea type.  No other red flag/warning signs or symptoms.  She is due for her first ever colonoscopy and we will proceed as per above.  I will start her on Bentyl 10 mg 3 times daily/ac.  Return for follow-up in 3 months.

## 2018-03-11 NOTE — Patient Instructions (Signed)
1. We will schedule your colonoscopy for you. 2. I have sent Bentyl 10 mg to your pharmacy.  Take this before eating, generally 3 times a day. 3. Return for follow-up in 3 months. 4. Call us if you have any questions or concerns   At [facility] we value your feedback. You may receive a survey about your visit today. Please share your experience as we strive to create trusting relationships with our patients to provide genuine, compassionate, quality care.  It was great meeting you today!  I hope you have a wonderful summer!!

## 2018-03-11 NOTE — Progress Notes (Signed)
Primary Care Physician:  Redmond School, MD Primary Gastroenterologist:  Dr. Gala Romney  Chief Complaint  Patient presents with  . Abdominal Pain    lower abd, mostly when she has a BM  . Colonoscopy    screening    HPI:   Dawn Edwards is a 52 y.o. female who presents in referral from primary care for abdominal pain.  At the end of 2018 the patient was recommended to call for first ever screening colonoscopy.  Unfortunately, our office was not able to get in touch with her to schedule triage.  He is likely overdue for first ever screening colonoscopy.  The patient was seen in the emergency department 12/03/2017 for epigastric abdominal pain.  At that time also noted some loose bowel movements, nausea but no vomiting, stomach worse after eating, has lost 20 pounds since December.  WBC count mildly elevated at 10.9, other labs essentially normal.  CT the abdomen and pelvis without acute findings.  Patient improved after IV fluids, query peptic ulcer disease.  She was treated with Pepcid for 2 weeks and referred to GI.  Viewed materials provided with referral including office visit dated 12/08/2017.  At that time noted week and decreased appetite which improved after stopping olmesartan, symptoms overall getting better.  Today she states she's doing ok overall. Has lower abdominal pain, primarily when having a bowel movement. Going through menopause and hormonal changes. Has lost 40 lbs since Christmas due to loss of appetite; has regained 3 lbs in the past week. Is under a lot of stress currently (but stable amount over 5 years). Knows she has to use the bathroom when her abdomen starts hurting. Pain lower abdomen, sharp, relieved with bowel movement. Stools normally loose. Denies hematochezia, melena, fever, chills.   Denies GERD or dysphagia symptoms.  Past Medical History:  Diagnosis Date  . Hypertension     Past Surgical History:  Procedure Laterality Date  . APPENDECTOMY    .  CESAREAN SECTION    . PILONIDAL CYST EXCISION    . ROTATOR CUFF REPAIR Right     Current Outpatient Medications  Medication Sig Dispense Refill  . venlafaxine XR (EFFEXOR-XR) 75 MG 24 hr capsule daily.  3   No current facility-administered medications for this visit.     Allergies as of 03/11/2018  . (No Known Allergies)    Family History  Problem Relation Age of Onset  . Gastric cancer Father   . Colon cancer Neg Hx   . Esophageal cancer Neg Hx     Social History   Socioeconomic History  . Marital status: Single    Spouse name: Not on file  . Number of children: Not on file  . Years of education: Not on file  . Highest education level: Not on file  Occupational History  . Not on file  Social Needs  . Financial resource strain: Not on file  . Food insecurity:    Worry: Not on file    Inability: Not on file  . Transportation needs:    Medical: Not on file    Non-medical: Not on file  Tobacco Use  . Smoking status: Current Every Day Smoker    Types: E-cigarettes  . Smokeless tobacco: Never Used  Substance and Sexual Activity  . Alcohol use: No  . Drug use: No  . Sexual activity: Not on file  Lifestyle  . Physical activity:    Days per week: Not on file    Minutes per  session: Not on file  . Stress: Not on file  Relationships  . Social connections:    Talks on phone: Not on file    Gets together: Not on file    Attends religious service: Not on file    Active member of club or organization: Not on file    Attends meetings of clubs or organizations: Not on file    Relationship status: Not on file  . Intimate partner violence:    Fear of current or ex partner: Not on file    Emotionally abused: Not on file    Physically abused: Not on file    Forced sexual activity: Not on file  Other Topics Concern  . Not on file  Social History Narrative  . Not on file    Review of Systems: General: Negative for anorexia, weight loss, fever, chills, fatigue,  weakness. ENT: Negative for hoarseness, difficulty swallowing. CV: Negative for chest pain, angina, palpitations, peripheral edema.  Respiratory: Negative for dyspnea at rest, cough, sputum, wheezing.  GI: See history of present illness. MS: Negative for joint pain, low back pain.  Derm: Negative for rash or itching.   Endo: Negative for unusual weight change.  Heme: Negative for bruising or bleeding. Allergy: Negative for rash or hives.    Physical Exam: BP 127/84   Pulse 73   Temp 97.7 F (36.5 C) (Oral)   Ht 5\' 6"  (1.676 m)   Wt 170 lb 12.8 oz (77.5 kg)   LMP 12/03/2016 Comment: pt says periods stopped last jan  BMI 27.57 kg/m  General:   Alert and oriented. Pleasant and cooperative. Well-nourished and well-developed.  Head:  Normocephalic and atraumatic. Eyes:  Without icterus, sclera clear and conjunctiva pink.  Ears:  Normal auditory acuity. Cardiovascular:  S1, S2 present without murmurs appreciated. Extremities without clubbing or edema. Respiratory:  Clear to auscultation bilaterally. No wheezes, rales, or rhonchi. No distress.  Gastrointestinal:  +BS, soft, and non-distended. Mild lower abdominal TTP. No HSM noted. No guarding or rebound. No masses appreciated.  Rectal:  Deferred  Musculoskalatal:  Symmetrical without gross deformities. Neurologic:  Alert and oriented x4;  grossly normal neurologically. Psych:  Alert and cooperative. Normal mood and affect. Heme/Lymph/Immune: No excessive bruising noted.    03/11/2018 8:49 AM   Disclaimer: This note was dictated with voice recognition software. Similar sounding words can inadvertently be transcribed and may not be corrected upon review.

## 2018-03-15 ENCOUNTER — Telehealth: Payer: Self-pay | Admitting: Internal Medicine

## 2018-03-15 DIAGNOSIS — R195 Other fecal abnormalities: Secondary | ICD-10-CM

## 2018-03-15 DIAGNOSIS — R1030 Lower abdominal pain, unspecified: Secondary | ICD-10-CM

## 2018-03-15 MED ORDER — DICYCLOMINE HCL 10 MG PO CAPS: 10.0000 mg | ORAL_CAPSULE | Freq: Three times a day (TID) | ORAL | 0 refills | Status: DC

## 2018-03-15 NOTE — Telephone Encounter (Signed)
EG please send Bentyl in to pts pharmacy.

## 2018-03-15 NOTE — Telephone Encounter (Signed)
Noted, LMOM, pt notified.

## 2018-03-15 NOTE — Telephone Encounter (Signed)
Pt called to say that her IBS rx hadn't been called into her pharmacy yet. She uses [facility].

## 2018-03-15 NOTE — Telephone Encounter (Signed)
Please tell the patient the Rx is at her pharmacy. Call if any further problems with getting her medication.

## 2018-03-23 ENCOUNTER — Telehealth: Payer: Self-pay

## 2018-03-23 DIAGNOSIS — R5381 Other malaise: Secondary | ICD-10-CM | POA: Diagnosis not present

## 2018-03-23 DIAGNOSIS — N951 Menopausal and female climacteric states: Secondary | ICD-10-CM | POA: Diagnosis not present

## 2018-03-23 NOTE — Telephone Encounter (Signed)
Pt came by office. She seen her OBGYN today (Dr. Pamala Hurry at [facility]). OBGYN requests for her to have EGD at same time as her upcoming TCS w/RMR 05/04/18 to rule out any gastric tissues. States she has lost 47 lbs since January (24 lbs in past 3 months). She is having epigastric pain. Pain is worse after eating. States the Dicyclomine EG gave her helped at first, but isn't helping now. She's been taking Dicyclomine 1-2 times a day when she eats. Has only been eating once or twice per day. Has no appetite. Still having diarrhea. States she can feel the food moving through intestines and intestines feel raw. She is aware EG is out of the office until Thursday. [phone]

## 2018-03-29 DIAGNOSIS — R195 Other fecal abnormalities: Secondary | ICD-10-CM | POA: Diagnosis not present

## 2018-04-01 NOTE — Telephone Encounter (Signed)
At her OV a couple weeks ago she denies UGI symptoms. If this is true, these are new symptoms and we can't just "tack on an EGD" without evaluating the patient. We can try and schedule her for next available appointment.

## 2018-04-06 NOTE — Telephone Encounter (Signed)
Noted, proceed with TCS as scheduled

## 2018-04-06 NOTE — Telephone Encounter (Signed)
Pt called office earlier but hung up before I could speak to her. Tried to call her back, no answer, LMOVM for return call.

## 2018-04-06 NOTE — Telephone Encounter (Signed)
Tried to call pt, no answer, LMOVM to call back ASAP (AB has openings tomorrow).

## 2018-04-06 NOTE — Telephone Encounter (Signed)
Noted  

## 2018-04-06 NOTE — Telephone Encounter (Signed)
Pt called office. Her PCP gave her Librax and it's helping. She has gained 2 lbs. She wants to continue with TCS as planned.

## 2018-04-30 ENCOUNTER — Telehealth: Payer: Self-pay | Admitting: *Deleted

## 2018-04-30 NOTE — Telephone Encounter (Signed)
Hoyle Sauer called and wants to see if patient can move procedure time to 7:30am since we have a gap on the schedule.   Called patient and she is fine with moving up. Aware she will need to arrive at 6:30am. She will drink second half of prep on 05/04/18 at 2:30am and nothing after 4:30am. She voiced understanding. Hoyle Sauer is aware

## 2018-05-03 ENCOUNTER — Telehealth: Payer: Self-pay | Admitting: Internal Medicine

## 2018-05-03 NOTE — Telephone Encounter (Signed)
Pt is scheduled for tomorrow and has questions regarding the times since the procedure times were changed. Please call her at [phone]

## 2018-05-03 NOTE — Telephone Encounter (Signed)
Called pt, new times for arrival and drinking 2nd half of prep given.

## 2018-05-04 ENCOUNTER — Encounter (HOSPITAL_COMMUNITY)
Admission: RE | Disposition: A | Discharge status: Home / Self Care | Payer: Self-pay | Source: Ambulatory Visit | Attending: Internal Medicine

## 2018-05-04 ENCOUNTER — Other Ambulatory Visit: Payer: Self-pay

## 2018-05-04 ENCOUNTER — Ambulatory Visit (HOSPITAL_COMMUNITY)
Admission: RE | Admit: 2018-05-04 | Discharge: 2018-05-04 | Disposition: A | Discharge status: Home / Self Care | Payer: 59 | Source: Ambulatory Visit | Attending: Internal Medicine | Admitting: Internal Medicine

## 2018-05-04 ENCOUNTER — Encounter (HOSPITAL_COMMUNITY): Payer: Self-pay | Admitting: *Deleted

## 2018-05-04 DIAGNOSIS — D125 Benign neoplasm of sigmoid colon: Secondary | ICD-10-CM | POA: Diagnosis not present

## 2018-05-04 DIAGNOSIS — Z1211 Encounter for screening for malignant neoplasm of colon: Secondary | ICD-10-CM | POA: Diagnosis not present

## 2018-05-04 DIAGNOSIS — D122 Benign neoplasm of ascending colon: Secondary | ICD-10-CM | POA: Diagnosis not present

## 2018-05-04 DIAGNOSIS — F17210 Nicotine dependence, cigarettes, uncomplicated: Secondary | ICD-10-CM | POA: Insufficient documentation

## 2018-05-04 DIAGNOSIS — Z1212 Encounter for screening for malignant neoplasm of rectum: Secondary | ICD-10-CM

## 2018-05-04 DIAGNOSIS — I10 Essential (primary) hypertension: Secondary | ICD-10-CM | POA: Diagnosis not present

## 2018-05-04 DIAGNOSIS — Z79899 Other long term (current) drug therapy: Secondary | ICD-10-CM | POA: Insufficient documentation

## 2018-05-04 HISTORY — PX: COLONOSCOPY: SHX5424

## 2018-05-04 SURGERY — COLONOSCOPY
Anesthesia: Moderate Sedation

## 2018-05-04 MED ORDER — SODIUM CHLORIDE 0.9 % IV SOLN
INTRAVENOUS | Status: DC
  Administered 2018-05-04: 07:00:00 via INTRAVENOUS

## 2018-05-04 MED ORDER — MEPERIDINE HCL 100 MG/ML IJ SOLN
INTRAMUSCULAR | Status: DC | PRN
  Administered 2018-05-04: 50 mg via INTRAVENOUS

## 2018-05-04 MED ORDER — ONDANSETRON HCL 4 MG/2ML IJ SOLN
INTRAMUSCULAR | Status: AC
  Filled 2018-05-04: qty 2

## 2018-05-04 MED ORDER — MIDAZOLAM HCL 5 MG/5ML IJ SOLN
INTRAMUSCULAR | Status: AC
  Filled 2018-05-04: qty 10

## 2018-05-04 MED ORDER — MIDAZOLAM HCL 5 MG/5ML IJ SOLN
INTRAMUSCULAR | Status: DC | PRN
  Administered 2018-05-04: 2 mg via INTRAVENOUS
  Administered 2018-05-04: 1 mg via INTRAVENOUS

## 2018-05-04 MED ORDER — ONDANSETRON HCL 4 MG/2ML IJ SOLN
INTRAMUSCULAR | Status: DC | PRN
  Administered 2018-05-04: 4 mg via INTRAVENOUS

## 2018-05-04 MED ORDER — MEPERIDINE HCL 100 MG/ML IJ SOLN
INTRAMUSCULAR | Status: AC
  Filled 2018-05-04: qty 2

## 2018-05-04 NOTE — H&P (Signed)
_0 @   Primary Care Physician:  Redmond School, MD Primary Gastroenterologist:  Dr. Gala Romney  Pre-Procedure History & Physical: HPI:  Dawn Edwards is a 52 y.o. female here for a screening colonoscopy.Patient does have some postprandial abdomina. Patient states Bentyl did not help.  No family history of colon cancer.  No prior colonoscopy.  Past Medical History:  Diagnosis Date  . Hypertension     Past Surgical History:  Procedure Laterality Date  . APPENDECTOMY    . CESAREAN SECTION    . PILONIDAL CYST EXCISION    . ROTATOR CUFF REPAIR Right     Prior to Admission medications   Medication Sig Start Date End Date Taking? Authorizing Provider  acetaminophen (TYLENOL) 650 MG CR tablet Take 650 mg by mouth every 8 (eight) hours as needed for pain.   Yes [provider]  clidinium-chlordiazePOXIDE (LIBRAX) 5-2.5 MG capsule Take 1 capsule by mouth daily as needed (IBS).    Yes [provider]  HYDROcodone-acetaminophen (NORCO/VICODIN) 5-325 MG tablet Take 1 tablet by mouth every 6 (six) hours as needed for severe pain.   Yes [provider]  Na Sulfate-K Sulfate-Mg Sulf 17.5-3.13-1.6 GM/177ML SOLN Take 1 kit by mouth as directed. 03/11/18  Yes Daneil Dolin, MD  venlafaxine XR (EFFEXOR-XR) 75 MG 24 hr capsule Take 75 mg by mouth daily.  03/02/18  Yes [provider]  dicyclomine (BENTYL) 10 MG capsule Take 1 capsule (10 mg total) by mouth 3 (three) times daily before meals. Patient not taking: Reported on 05/03/2018 03/15/18   Carlis Stable, NP    Allergies as of 03/11/2018  . (No Known Allergies)    Family History  Problem Relation Age of Onset  . Gastric cancer Father   . Colon cancer Neg Hx   . Esophageal cancer Neg Hx     Social History   Socioeconomic History  . Marital status: Single    Spouse name: Not on file  . Number of children: Not on file  . Years of education: Not on file  . Highest education level: Not on file  Occupational  History  . Not on file  Social Needs  . Financial resource strain: Not on file  . Food insecurity:    Worry: Not on file    Inability: Not on file  . Transportation needs:    Medical: Not on file    Non-medical: Not on file  Tobacco Use  . Smoking status: Current Every Day Smoker    Types: E-cigarettes  . Smokeless tobacco: Never Used  Substance and Sexual Activity  . Alcohol use: No  . Drug use: No  . Sexual activity: Not on file  Lifestyle  . Physical activity:    Days per week: Not on file    Minutes per session: Not on file  . Stress: Not on file  Relationships  . Social connections:    Talks on phone: Not on file    Gets together: Not on file    Attends religious service: Not on file    Active member of club or organization: Not on file    Attends meetings of clubs or organizations: Not on file    Relationship status: Not on file  . Intimate partner violence:    Fear of current or ex partner: Not on file    Emotionally abused: Not on file    Physically abused: Not on file    Forced sexual activity: Not on file  Other Topics  Concern  . Not on file  Social History Narrative  . Not on file    Review of Systems: See HPI, otherwise negative ROS  Physical Exam: BP 128/75   Pulse (!) 47   Temp 97.8 F (36.6 C) (Oral)   Resp 18   Ht _0  (1.676 m)   Wt 158 lb (71.7 kg)   LMP 12/03/2016 Comment: pt says periods stopped last jan  SpO2 99%   BMI 25.50 kg/m  General:   Alert,  Well-developed, well-nourished, pleasant and cooperative in NAD Neck:  Supple; no masses or thyromegaly. No significant cervical adenopathy. Lungs:  Clear throughout to auscultation.   No wheezes, crackles, or rhonchi. No acute distress. Heart:  Regular rate and rhythm; no murmurs, clicks, rubs,  or gallops. Abdomen: Non-distended, normal bowel sounds.  Soft and nontender without appreciable mass or hepatosplenomegaly.  Pulses:  Normal pulses noted. Extremities:  Without clubbing or  edema.  Impression/Plan:  52 year old lady here for first ever Screening colonoscopy. The risks, benefits, limitations, alternatives and imponderables have been reviewed with the patient. Questions have been answered. All parties are agreeable.      Notice: This dictation was prepared with Dragon dictation along with smaller phrase technology. Any transcriptional errors that result from this process are unintentional and may not be corrected upon review.

## 2018-05-04 NOTE — Discharge Instructions (Signed)
°  Colonoscopy Discharge Instructions  Read the instructions outlined below and refer to this sheet in the next few weeks. These discharge instructions provide you with general information on caring for yourself after you leave the hospital. Your doctor may also give you specific instructions. While your treatment has been planned according to the most current medical practices available, unavoidable complications occasionally occur. If you have any problems or questions after discharge, call Dr. Gala Romney at [phone]. ACTIVITY  You may resume your regular activity, but move at a slower pace for the next 24 hours.   Take frequent rest periods for the next 24 hours.   Walking will help get rid of the air and reduce the bloated feeling in your belly (abdomen).   No driving for 24 hours (because of the medicine (anesthesia) used during the test).    Do not sign any important legal documents or operate any machinery for 24 hours (because of the anesthesia used during the test).  NUTRITION  Drink plenty of fluids.   You may resume your normal diet as instructed by your doctor.   Begin with a light meal and progress to your normal diet. Heavy or fried foods are harder to digest and may make you feel sick to your stomach (nauseated).   Avoid alcoholic beverages for 24 hours or as instructed.  MEDICATIONS  You may resume your normal medications unless your doctor tells you otherwise.  WHAT YOU CAN EXPECT TODAY  Some feelings of bloating in the abdomen.   Passage of more gas than usual.   Spotting of blood in your stool or on the toilet paper.  IF YOU HAD POLYPS REMOVED DURING THE COLONOSCOPY:  No aspirin products for 7 days or as instructed.   No alcohol for 7 days or as instructed.   Eat a soft diet for the next 24 hours.  FINDING OUT THE RESULTS OF YOUR TEST Not all test results are available during your visit. If your test results are not back during the visit, make an appointment  with your caregiver to find out the results. Do not assume everything is normal if you have not heard from your caregiver or the medical facility. It is important for you to follow up on all of your test results.  SEEK IMMEDIATE MEDICAL ATTENTION IF:  You have more than a spotting of blood in your stool.   Your belly is swollen (abdominal distention).   You are nauseated or vomiting.   You have a temperature over 101.   You have abdominal pain or discomfort that is severe or gets worse throughout the day.    Polyp information provided  Further recommendations to follow pending review of pathology report  Keep August office appointment.

## 2018-05-04 NOTE — Op Note (Signed)
[facility] Patient Name: Dawn Edwards Procedure Date: 05/04/2018 7:04 AM MRN: [id number] Date of Birth: [birth year] Attending MD: Norvel Richards , MD CSN: [id number] Age: 52 Admit Type: Outpatient Procedure:                Colonoscopy Indications:              Screening for colorectal malignant neoplasm Providers:                Norvel Richards, MD, Hinton Rao, RN,                            Randa Spike, Technician Referring MD:              Medicines:                Midazolam 3 mg IV, Meperidine 50 mg IV, Ondansetron                            4 mg IV Complications:            No immediate complications. Estimated Blood Loss:     Estimated blood loss was minimal. Procedure:                Pre-Anesthesia Assessment:                           - Prior to the procedure, a History and Physical                            was performed, and patient medications and                            allergies were reviewed. The patient's tolerance of                            previous anesthesia was also reviewed. The risks                            and benefits of the procedure and the sedation                            options and risks were discussed with the patient.                            All questions were answered, and informed consent                            was obtained. Prior Anticoagulants: The patient has                            taken no previous anticoagulant or antiplatelet                            agents. ASA Grade Assessment: II - A patient with  mild systemic disease. After reviewing the risks                            and benefits, the patient was deemed in                            satisfactory condition to undergo the procedure.                           After obtaining informed consent, the colonoscope                            was passed under direct vision. Throughout the                            procedure,  the patient's blood pressure, pulse, and                            oxygen saturations were monitored continuously. The                            EC-[id number] ([id number]) scope was introduced through                            the anus and advanced to the 5 cm into the ileum.                            The colonoscopy was performed without difficulty.                            The patient tolerated the procedure well. The                            quality of the bowel preparation was adequate. The                            terminal ileum, ileocecal valve, appendiceal                            orifice, and rectum were photographed. The entire                            colon was well visualized. Scope In: 7:45:05 AM Scope Out: 8:01:30 AM Scope Withdrawal Time: 0 hours 13 minutes 47 seconds  Total Procedure Duration: 0 hours 16 minutes 25 seconds  Findings:      The perianal and digital rectal examinations were normal.      A 9 mm polyp was found in the sigmoid colon. The polyp was       semi-pedunculated. The polyp was removed with a hot snare. Resection and       retrieval were complete. Estimated blood loss: none.      A 5 mm polyp was found in the ascending colon. The polyp was sessile.       The polyp was removed with a cold  snare. Resection and retrieval were       complete. Estimated blood loss was minimal.      The exam was otherwise without abnormality on direct and retroflexion       views. Distal 5 cm of terminal ileum also appeared normal. Impression:               - One 9 mm polyp in the sigmoid colon, removed with                            a hot snare. Resected and retrieved.                           - One 5 mm polyp in the ascending colon, removed                            with a cold snare. Resected and retrieved.                           - The examination was otherwise normal on direct                            and retroflexion views. Moderate Sedation:       Moderate (conscious) sedation was administered by the endoscopy nurse       and supervised by the endoscopist. The following parameters were       monitored: oxygen saturation, heart rate, blood pressure, respiratory       rate, EKG, adequacy of pulmonary ventilation, and response to care.       Total physician intraservice time was 21 minutes. Recommendation:           - Patient has a contact number available for                            emergencies. The signs and symptoms of potential                            delayed complications were discussed with the                            patient. Return to normal activities tomorrow.                            Written discharge instructions were provided to the                            patient.                           - Advance diet as tolerated.                           - Continue present medications.                           - Repeat colonoscopy date to be determined after  pending pathology results are reviewed for                            surveillance.                           - Return to GI office in 6 weeks. Procedure Code(s):        --- Professional ---                           [phone], Colonoscopy, flexible; with removal of                            tumor(s), polyp(s), or other lesion(s) by snare                            technique                           G0500, Moderate sedation services provided by the                            same physician or other qualified health care                            professional performing a gastrointestinal                            endoscopic service that sedation supports,                            requiring the presence of an independent trained                            observer to assist in the monitoring of the                            patient's level of consciousness and physiological                            status; initial 15 minutes of  intra-service time;                            patient age 70 years or older (additional time may                            be reported with [phone], as appropriate) Diagnosis Code(s):        --- Professional ---                           Z12.11, Encounter for screening for malignant                            neoplasm of colon  D12.5, Benign neoplasm of sigmoid colon                           D12.2, Benign neoplasm of ascending colon CPT copyright 2017 American [employer]. All rights reserved. The codes documented in this report are preliminary and upon coder review may  be revised to meet current compliance requirements. Cristopher Estimable. Brenen Beigel, MD Norvel Richards, MD 05/04/2018 8:09:32 AM This report has been signed electronically. Number of Addenda: 0

## 2018-05-06 ENCOUNTER — Encounter: Payer: Self-pay | Admitting: Internal Medicine

## 2018-05-06 ENCOUNTER — Encounter (HOSPITAL_COMMUNITY): Payer: Self-pay | Admitting: Internal Medicine

## 2018-06-15 ENCOUNTER — Ambulatory Visit (INDEPENDENT_AMBULATORY_CARE_PROVIDER_SITE_OTHER): Payer: 59 | Admitting: Nurse Practitioner

## 2018-06-15 ENCOUNTER — Encounter: Payer: Self-pay | Admitting: Nurse Practitioner

## 2018-06-15 VITALS — BP 128/88 | HR 70 | Temp 97.1°F | Ht 66.0 in | Wt 158.4 lb

## 2018-06-15 DIAGNOSIS — R634 Abnormal weight loss: Secondary | ICD-10-CM

## 2018-06-15 DIAGNOSIS — R195 Other fecal abnormalities: Secondary | ICD-10-CM

## 2018-06-15 DIAGNOSIS — R1030 Lower abdominal pain, unspecified: Secondary | ICD-10-CM

## 2018-06-15 NOTE — Assessment & Plan Note (Signed)
Significantly improved on Librax and Bentyl.  Recommend she continue her current medications.  She is also on an antidepressant to help with stress and anxiety.  Follow-up in 3 months.

## 2018-06-15 NOTE — Progress Notes (Signed)
Referring Provider: Redmond School, MD Primary Care Physician:  Redmond School, MD Primary GI:  Dr. Gala Romney  Chief Complaint  Patient presents with  . Diarrhea    has improved  . Abdominal Pain    improved, not as bad/frequent. Takes librax prescribed by PCP and bentyl PRN  . no appetite    HPI:   Dawn Edwards is a 52 y.o. female who presents for follow-up on abdominal pain and diarrhea.  Patient was last seen in our office 03/11/2018 for lower abdominal pain and loose stools.  The patient was also overdue for first ever screening colonoscopy.  She was in the emergency room for abdominal pain and treated with Pepcid for 2 weeks and referred to GI.  At her last visit she noted lower abdominal pain primarily when having a bowel movement.  She brought up that she is going through menopause and having hormonal changes.  She is lost 40 pounds since Christmas due to lack of appetite although she regained 3 pounds in the previous week.  Under significant stress recently.  Abdominal pain triggers her need to have a bowel movement.  Her pain is sharp and relieved with a bowel movement.  No other red flag or warning signs or symptoms.  Recommended proceed with colonoscopy, Bentyl 10 mg 3 times a day as needed, follow-up in 3 months.  There was a request from primary care to "add on an EGD" due to upper GI symptoms.  When specifically asked at her last appointment she denied any upper GI symptoms.  We informed her that we could not just "add on an EGD because she was having some heartburn" that she would need to be evaluated due to new onset of symptoms and decide if an EGD is justified and covered.  Colonoscopy was completed 05/04/2018 which found a 9 mm sigmoid colon polyp, 5 mm ascending colon polyp, otherwise normal.  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  Recommended repeat colonoscopy in 5 years.  Today she states she's doing better. Her PCP placed her on Librex. Sometimes  stomach gets "nervous symptoms" and Librex helps. But when she has abdominal discomfort and "upset stomach" or diarrhea will use Bentyl which helps. Has a poor appetite, has to force herself to eat often. Has continued weight loss. Has intermittent upper abdominal pain/epigastric pain, no other abdominal pain, dysphagia. Minimal to no GERD symptoms. She thinks a previous medication may have affected her appetite in addition to significant stress. Still with significant weight loss. She is wanting to hold off on EGD for now because she is feeling better, to see if her weight loss responds. Denies hematochezia, melena, fever, chills. Denies chest pain, dyspnea, dizziness, lightheadedness, syncope, near syncope. Denies any other upper or lower GI symptoms.  Past Medical History:  Diagnosis Date  . Hypertension     Past Surgical History:  Procedure Laterality Date  . APPENDECTOMY    . CESAREAN SECTION    . COLONOSCOPY N/A 05/04/2018   Procedure: COLONOSCOPY;  Surgeon: Daneil Dolin, MD;  Location: [facility];  Service: Endoscopy;  Laterality: N/A;  2:00pm  . PILONIDAL CYST EXCISION    . ROTATOR CUFF REPAIR Right     Current Outpatient Medications  Medication Sig Dispense Refill  . acetaminophen (TYLENOL) 650 MG CR tablet Take 650 mg by mouth every 8 (eight) hours as needed for pain.    . clidinium-chlordiazePOXIDE (LIBRAX) 5-2.5 MG capsule Take 1 capsule by mouth daily as  needed (IBS).     Marland Kitchen dicyclomine (BENTYL) 10 MG capsule Take 1 capsule (10 mg total) by mouth 3 (three) times daily before meals. (Patient taking differently: Take 10 mg by mouth as needed. ) 90 capsule 0  . venlafaxine XR (EFFEXOR-XR) 75 MG 24 hr capsule Take 75 mg by mouth daily.   3   No current facility-administered medications for this visit.     Allergies as of 06/15/2018  . (No Known Allergies)    Family History  Problem Relation Age of Onset  . Gastric cancer Father   . Colon cancer Neg Hx   . Esophageal  cancer Neg Hx     Social History   Socioeconomic History  . Marital status: Single    Spouse name: Not on file  . Number of children: Not on file  . Years of education: Not on file  . Highest education level: Not on file  Occupational History  . Not on file  Social Needs  . Financial resource strain: Not on file  . Food insecurity:    Worry: Not on file    Inability: Not on file  . Transportation needs:    Medical: Not on file    Non-medical: Not on file  Tobacco Use  . Smoking status: Current Every Day Smoker    Types: E-cigarettes  . Smokeless tobacco: Never Used  Substance and Sexual Activity  . Alcohol use: No  . Drug use: No  . Sexual activity: Not on file  Lifestyle  . Physical activity:    Days per week: Not on file    Minutes per session: Not on file  . Stress: Not on file  Relationships  . Social connections:    Talks on phone: Not on file    Gets together: Not on file    Attends religious service: Not on file    Active member of club or organization: Not on file    Attends meetings of clubs or organizations: Not on file    Relationship status: Not on file  Other Topics Concern  . Not on file  Social History Narrative  . Not on file    Review of Systems: General: Negative for anorexia, weight loss, fever, chills, fatigue, weakness. ENT: Negative for hoarseness, difficulty swallowing , nasal congestion. CV: Negative for chest pain, angina, palpitations, peripheral edema.  Respiratory: Negative for dyspnea at rest, cough, sputum, wheezing.  GI: See history of present illness. Endo: Negative for unusual weight change.  Heme: Negative for bruising or bleeding. Allergy: Negative for rash or hives.   Physical Exam: BP 128/88   Pulse 70   Temp (!) 97.1 F (36.2 C) (Oral)   Ht 5\' 6"  (1.676 m)   Wt 158 lb 6.4 oz (71.8 kg)   LMP 12/03/2016 Comment: pt says periods stopped last jan  BMI 25.57 kg/m  General:   Alert and oriented. Pleasant and  cooperative. Well-nourished and well-developed.  Eyes:  Without icterus, sclera clear and conjunctiva pink.  Ears:  Normal auditory acuity. Cardiovascular:  S1, S2 present without murmurs appreciated. Extremities without clubbing or edema. Respiratory:  Clear to auscultation bilaterally. No wheezes, rales, or rhonchi. No distress.  Gastrointestinal:  +BS, soft, non-tender and non-distended. No HSM noted. No guarding or rebound. No masses appreciated.  Rectal:  Deferred  Musculoskalatal:  Symmetrical without gross deformities. Neurologic:  Alert and oriented x4;  grossly normal neurologically. Psych:  Alert and cooperative. Normal mood and affect. Heme/Lymph/Immune: No excessive bruising noted.  06/15/2018 9:03 AM   Disclaimer: This note was dictated with voice recognition software. Similar sounding words can inadvertently be transcribed and may not be corrected upon review.

## 2018-06-15 NOTE — Patient Instructions (Signed)
1. Continue your current medications. 2. Return for follow-up in 3 months. 3. Call us if you have any questions or concerns.  At [facility] we value your feedback. You may receive a survey about your visit today. Please share your experience as we strive to create trusting relationships with our patients to provide genuine, compassionate, quality care.  It was great to see you today!  I hope you have a great summer!!

## 2018-06-15 NOTE — Assessment & Plan Note (Signed)
The patient has persistent weight loss.  She feels it is multifactorial in etiology between irritable bowel syndrome, previous medication, high stress, lower GI symptoms.  Overall she is feeling better from other perspectives.  She has lost an additional 12 pounds in the previous 3 months.  She is not underweight and malnourished.  She states she has a poor appetite and often has to force herself to eat.  She is wanting to hold off on endoscopic evaluation with EGD to see if her weight stabilizes now that her other symptoms are resolving.  I recommend she continue her current medications and follow-up in 3 months.  If she has persistent weight loss then we can decide on further evaluation.

## 2018-06-15 NOTE — Assessment & Plan Note (Signed)
She occasionally still has abdominal pain for which Bentyl is quite effective.  I recommend she continue her medications.  Follow-up in 3 months and call us if any worsening symptoms or problems.

## 2018-06-16 NOTE — Progress Notes (Signed)
cc'ed to pcp °

## 2018-08-09 ENCOUNTER — Other Ambulatory Visit: Payer: Self-pay

## 2018-08-09 ENCOUNTER — Emergency Department (HOSPITAL_COMMUNITY): Payer: 59

## 2018-08-09 ENCOUNTER — Emergency Department (HOSPITAL_COMMUNITY)
Admission: EM | Admit: 2018-08-09 | Discharge: 2018-08-09 | Disposition: A | Discharge status: Home / Self Care | Payer: 59 | Attending: Emergency Medicine | Admitting: Emergency Medicine

## 2018-08-09 ENCOUNTER — Encounter (HOSPITAL_COMMUNITY): Payer: Self-pay | Admitting: Emergency Medicine

## 2018-08-09 DIAGNOSIS — R197 Diarrhea, unspecified: Secondary | ICD-10-CM | POA: Diagnosis present

## 2018-08-09 DIAGNOSIS — F17210 Nicotine dependence, cigarettes, uncomplicated: Secondary | ICD-10-CM | POA: Insufficient documentation

## 2018-08-09 DIAGNOSIS — J189 Pneumonia, unspecified organism: Secondary | ICD-10-CM | POA: Insufficient documentation

## 2018-08-09 DIAGNOSIS — I10 Essential (primary) hypertension: Secondary | ICD-10-CM | POA: Diagnosis not present

## 2018-08-09 DIAGNOSIS — Z79899 Other long term (current) drug therapy: Secondary | ICD-10-CM | POA: Diagnosis not present

## 2018-08-09 DIAGNOSIS — R5383 Other fatigue: Secondary | ICD-10-CM | POA: Diagnosis not present

## 2018-08-09 DIAGNOSIS — R5381 Other malaise: Secondary | ICD-10-CM | POA: Diagnosis not present

## 2018-08-09 HISTORY — DX: Major depressive disorder, single episode, unspecified: F32.9

## 2018-08-09 HISTORY — DX: Irritable bowel syndrome, unspecified: K58.9

## 2018-08-09 HISTORY — DX: Depression, unspecified: F32.A

## 2018-08-09 LAB — SEDIMENTATION RATE: Sed Rate: 82 mm/hr — ABNORMAL HIGH (ref 0–22)

## 2018-08-09 LAB — CBC
HCT: 37 % (ref 36.0–46.0)
HEMOGLOBIN: 12.3 g/dL (ref 12.0–15.0)
MCH: 30.6 pg (ref 26.0–34.0)
MCHC: 33.2 g/dL (ref 30.0–36.0)
MCV: 92 fL (ref 78.0–100.0)
PLATELETS: 572 10*3/uL — AB (ref 150–400)
RBC: 4.02 MIL/uL (ref 3.87–5.11)
RDW: 12.5 % (ref 11.5–15.5)
WBC: 16.1 10*3/uL — ABNORMAL HIGH (ref 4.0–10.5)

## 2018-08-09 LAB — COMPREHENSIVE METABOLIC PANEL
ALBUMIN: 3.2 g/dL — AB (ref 3.5–5.0)
ALT: 32 U/L (ref 0–44)
ANION GAP: 8 (ref 5–15)
AST: 29 U/L (ref 15–41)
Alkaline Phosphatase: 130 U/L — ABNORMAL HIGH (ref 38–126)
BUN: 10 mg/dL (ref 6–20)
CHLORIDE: 102 mmol/L (ref 98–111)
CO2: 25 mmol/L (ref 22–32)
Calcium: 8.6 mg/dL — ABNORMAL LOW (ref 8.9–10.3)
Creatinine, Ser: 0.7 mg/dL (ref 0.44–1.00)
GFR calc non Af Amer: >60 mL/min (ref >60)
Glucose, Bld: 109 mg/dL — ABNORMAL HIGH (ref 70–99)
POTASSIUM: 3.7 mmol/L (ref 3.5–5.1)
Sodium: 135 mmol/L (ref 135–145)
TOTAL PROTEIN: 7.2 g/dL (ref 6.5–8.1)
Total Bilirubin: 0.7 mg/dL (ref 0.3–1.2)

## 2018-08-09 LAB — URINALYSIS, ROUTINE W REFLEX MICROSCOPIC
BILIRUBIN URINE: NEGATIVE
Glucose, UA: NEGATIVE mg/dL
Hgb urine dipstick: NEGATIVE
KETONES UR: 5 mg/dL — AB
Leukocytes, UA: NEGATIVE
NITRITE: NEGATIVE
PROTEIN: NEGATIVE mg/dL
Specific Gravity, Urine: 1.018 (ref 1.005–1.030)
pH: 7 (ref 5.0–8.0)

## 2018-08-09 LAB — TSH: TSH: 0.705 u[IU]/mL (ref 0.350–4.500)

## 2018-08-09 LAB — I-STAT BETA HCG BLOOD, ED (MC, WL, AP ONLY)

## 2018-08-09 LAB — LIPASE, BLOOD: LIPASE: 26 U/L (ref 11–51)

## 2018-08-09 MED ORDER — ONDANSETRON HCL 4 MG/2ML IJ SOLN
4.0000 mg | Freq: Once | INTRAMUSCULAR | Status: AC
  Administered 2018-08-09: 4 mg via INTRAVENOUS
  Filled 2018-08-09: qty 2

## 2018-08-09 MED ORDER — SODIUM CHLORIDE 0.9 % IV SOLN
1.0000 g | Freq: Once | INTRAVENOUS | Status: AC
  Administered 2018-08-09: 1 g via INTRAVENOUS
  Filled 2018-08-09: qty 10

## 2018-08-09 MED ORDER — SODIUM CHLORIDE 0.9 % IV BOLUS
1000.0000 mL | Freq: Once | INTRAVENOUS | Status: AC
  Administered 2018-08-09: 1000 mL via INTRAVENOUS

## 2018-08-09 MED ORDER — AZITHROMYCIN 250 MG PO TABS: ORAL_TABLET | ORAL | 0 refills | Status: DC

## 2018-08-09 MED ORDER — SODIUM CHLORIDE 0.9 % IV SOLN
500.0000 mg | INTRAVENOUS | Status: DC
  Administered 2018-08-09: 500 mg via INTRAVENOUS
  Filled 2018-08-09: qty 500

## 2018-08-09 NOTE — Discharge Instructions (Addendum)
Tests show a pneumonia in your left lung.  Prescription for antibiotic.  Increase fluids.  Tylenol for fever or chills.  Recommend follow-up with neurologist for muscular weakness.  Phone numbers given.

## 2018-08-09 NOTE — ED Triage Notes (Signed)
Pt reports diarrhea and no appetite since jan 2019. Pt reports started her on IBS medications without relief. Diarrhea several times a day.

## 2018-08-09 NOTE — ED Provider Notes (Signed)
[facility] EMERGENCY DEPARTMENT Provider Note   CSN: [id number] Arrival date & time: 08/09/18  1045     History   Chief Complaint Chief Complaint  Patient presents with  . Diarrhea    HPI Dawn Edwards is a 52 y.o. female.  Patient reports chills, night sweats for several days with minimal cough.  Review of systems positive for 60 pound weight loss in the past 9 months, generalized fatigue and malaise, muscular weakness.  Past medical history includes hypertension, depression, irritable bowel syndrome.  Severity of symptoms is moderate.  Exertion makes symptoms worse.  Patient has been seen by primary care and gastroenterology for her ongoing concerns of weight loss.     Past Medical History:  Diagnosis Date  . Depression   . Hypertension   . IBS (irritable bowel syndrome)     Patient Active Problem List   Diagnosis Date Noted  . Loss of weight 06/15/2018  . Abdominal pain 03/11/2018  . Loose stools 03/11/2018    Past Surgical History:  Procedure Laterality Date  . APPENDECTOMY    . CESAREAN SECTION    . COLONOSCOPY N/A 05/04/2018   Procedure: COLONOSCOPY;  Surgeon: Daneil Dolin, MD;  Location: [facility];  Service: Endoscopy;  Laterality: N/A;  2:00pm  . PILONIDAL CYST EXCISION    . ROTATOR CUFF REPAIR Right      OB History   None      Home Medications    Prior to Admission medications   Medication Sig Start Date End Date Taking? Authorizing Provider  acetaminophen (TYLENOL) 650 MG CR tablet Take 650 mg by mouth every 8 (eight) hours as needed for pain.   Yes [provider]  clidinium-chlordiazePOXIDE (LIBRAX) 5-2.5 MG capsule Take 1 capsule by mouth daily as needed (IBS).    Yes [provider]  dicyclomine (BENTYL) 10 MG capsule Take 1 capsule (10 mg total) by mouth 3 (three) times daily before meals. Patient taking differently: Take 10 mg by mouth as needed.  03/15/18  Yes Carlis Stable, NP  venlafaxine XR (EFFEXOR-XR) 75 MG 24  hr capsule Take 75 mg by mouth daily.  03/02/18  Yes [provider]  azithromycin (ZITHROMAX) 250 MG tablet 1 tablet daily starting Tuesday afternoon for 4 days 08/09/18   Nat Christen, MD    Family History Family History  Problem Relation Age of Onset  . Gastric cancer Father   . Colon cancer Neg Hx   . Esophageal cancer Neg Hx     Social History Social History   Tobacco Use  . Smoking status: Current Every Day Smoker    Packs/day: 1.00    Types: Cigarettes  . Smokeless tobacco: Never Used  Substance Use Topics  . Alcohol use: No  . Drug use: Yes    Types: Marijuana     Allergies   Patient has no known allergies.   Review of Systems Review of Systems  All other systems reviewed and are negative.    Physical Exam Updated Vital Signs BP 115/80 (BP Location: Left Arm)   Pulse 77   Temp 98.4 F (36.9 C) (Oral)   Resp 18   Ht 5\' 6"  (1.676 m)   Wt 68 kg   LMP 12/03/2016 Comment: pt says periods stopped last jan  SpO2 97%   BMI 24.21 kg/m   Physical Exam  Constitutional: She is oriented to person, place, and time. She appears well-developed and well-nourished.  Nontoxic-appearing.  HENT:  Head: Normocephalic  and atraumatic.  Eyes: Conjunctivae are normal.  Neck: Neck supple.  Cardiovascular: Normal rate and regular rhythm.  Pulmonary/Chest: Effort normal and breath sounds normal.  Abdominal: Soft. Bowel sounds are normal.  Musculoskeletal: Normal range of motion.  Neurological: She is alert and oriented to person, place, and time.  Skin: Skin is warm and dry.  Psychiatric: She has a normal mood and affect. Her behavior is normal.  Nursing note and vitals reviewed.    ED Treatments / Results  Labs (all labs ordered are listed, but only abnormal results are displayed) Labs Reviewed  COMPREHENSIVE METABOLIC PANEL - Abnormal; Notable for the following components:      Result Value   Glucose, Bld 109 (*)    Calcium 8.6 (*)    Albumin 3.2 (*)      Alkaline Phosphatase 130 (*)    All other components within normal limits  CBC - Abnormal; Notable for the following components:   WBC 16.1 (*)    Platelets 572 (*)    All other components within normal limits  URINALYSIS, ROUTINE W REFLEX MICROSCOPIC - Abnormal; Notable for the following components:   Ketones, ur 5 (*)    All other components within normal limits  SEDIMENTATION RATE - Abnormal; Notable for the following components:   Sed Rate 82 (*)    All other components within normal limits  LIPASE, BLOOD  TSH  I-STAT BETA HCG BLOOD, ED ([facility], [facility], [facility] ONLY)    EKG None  Radiology Dg Chest 2 View  Result Date: 08/09/2018 CLINICAL DATA:  Cough and chest tightness for the past 2 weeks. Current smoker. Weight loss of nearly 60 pounds in the past 9 months. EXAM: CHEST - 2 VIEW COMPARISON:  PA and lateral chest x-ray of August 09, 2018 FINDINGS: The lungs are adequately inflated. There is patchy infiltrate in the lingula. There is no pleural effusion. The heart and pulmonary vascularity are normal. The mediastinum is normal in width. The trachea is midline. There is no pleural effusion. The bony thorax exhibits no acute abnormality. IMPRESSION: Patchy pneumonia in the lingula. Followup PA and lateral chest X-ray is recommended in 3-4 weeks following trial of antibiotic therapy to ensure resolution and exclude underlying malignancy. Thoracic aortic atherosclerosis. Electronically Signed   By: David  Martinique M.D.   On: 08/09/2018 13:22    Procedures Procedures (including critical care time)  Medications Ordered in ED Medications  azithromycin (ZITHROMAX) 500 mg in sodium chloride 0.9 % 250 mL IVPB (500 mg Intravenous New Bag/Given 08/09/18 1600)  sodium chloride 0.9 % bolus 1,000 mL (0 mLs Intravenous Stopped 08/09/18 1407)  ondansetron (ZOFRAN) injection 4 mg (4 mg Intravenous Given 08/09/18 1258)  sodium chloride 0.9 % bolus 1,000 mL (0 mLs Intravenous Stopped 08/09/18 1407)   cefTRIAXone (ROCEPHIN) 1 g in sodium chloride 0.9 % 100 mL IVPB (0 g Intravenous Stopped 08/09/18 1557)     Initial Impression / Assessment and Plan / ED Course  I have reviewed the triage vital signs and the nursing notes.  Pertinent labs & imaging results that were available during my care of the patient were reviewed by me and considered in my medical decision making (see chart for details).     Patient presents with chills, night sweats, malaise.  Additionally, she has had a 60 pound weight loss in the past 9 months.  White count 16.1.  Glucose 109.  Urinalysis reveals a small amount of ketones.  Chest x-ray positive for lingular pneumonia.  Sed rate  82.  Patient given IV fluids, IV Rocephin, IV Zithromax.  Discharge medications Zithromax orally.  Discussed test results with the patient.  She will follow-up with neurology.  Final Clinical Impressions(s) / ED Diagnoses   Final diagnoses:  Community acquired pneumonia of left lung, unspecified part of lung    ED Discharge Orders         Ordered    azithromycin (ZITHROMAX) 250 MG tablet     08/09/18 1657           Nat Christen, MD 08/09/18 1715

## 2018-08-11 DIAGNOSIS — R531 Weakness: Secondary | ICD-10-CM | POA: Diagnosis not present

## 2018-08-11 DIAGNOSIS — R197 Diarrhea, unspecified: Secondary | ICD-10-CM | POA: Diagnosis not present

## 2018-08-11 DIAGNOSIS — A09 Infectious gastroenteritis and colitis, unspecified: Secondary | ICD-10-CM | POA: Diagnosis not present

## 2018-08-13 ENCOUNTER — Encounter (HOSPITAL_COMMUNITY): Payer: Self-pay

## 2018-08-13 ENCOUNTER — Observation Stay (HOSPITAL_COMMUNITY)
Admission: EM | Admit: 2018-08-13 | Discharge: 2018-08-15 | Disposition: A | Discharge status: Home / Self Care | Payer: 59 | Attending: Family Medicine | Admitting: Family Medicine

## 2018-08-13 ENCOUNTER — Other Ambulatory Visit: Payer: Self-pay

## 2018-08-13 ENCOUNTER — Emergency Department (HOSPITAL_COMMUNITY): Payer: 59

## 2018-08-13 DIAGNOSIS — R197 Diarrhea, unspecified: Secondary | ICD-10-CM | POA: Insufficient documentation

## 2018-08-13 DIAGNOSIS — E876 Hypokalemia: Secondary | ICD-10-CM | POA: Diagnosis not present

## 2018-08-13 DIAGNOSIS — Z79899 Other long term (current) drug therapy: Secondary | ICD-10-CM | POA: Insufficient documentation

## 2018-08-13 DIAGNOSIS — Z23 Encounter for immunization: Secondary | ICD-10-CM | POA: Insufficient documentation

## 2018-08-13 DIAGNOSIS — R079 Chest pain, unspecified: Secondary | ICD-10-CM | POA: Diagnosis not present

## 2018-08-13 DIAGNOSIS — F419 Anxiety disorder, unspecified: Secondary | ICD-10-CM | POA: Diagnosis not present

## 2018-08-13 DIAGNOSIS — E871 Hypo-osmolality and hyponatremia: Secondary | ICD-10-CM | POA: Diagnosis present

## 2018-08-13 DIAGNOSIS — R112 Nausea with vomiting, unspecified: Secondary | ICD-10-CM | POA: Diagnosis not present

## 2018-08-13 DIAGNOSIS — I10 Essential (primary) hypertension: Secondary | ICD-10-CM | POA: Insufficient documentation

## 2018-08-13 DIAGNOSIS — R0602 Shortness of breath: Secondary | ICD-10-CM | POA: Diagnosis present

## 2018-08-13 DIAGNOSIS — F17210 Nicotine dependence, cigarettes, uncomplicated: Secondary | ICD-10-CM | POA: Diagnosis not present

## 2018-08-13 DIAGNOSIS — R7989 Other specified abnormal findings of blood chemistry: Secondary | ICD-10-CM | POA: Diagnosis present

## 2018-08-13 DIAGNOSIS — D7589 Other specified diseases of blood and blood-forming organs: Secondary | ICD-10-CM | POA: Insufficient documentation

## 2018-08-13 DIAGNOSIS — D75838 Other thrombocytosis: Secondary | ICD-10-CM | POA: Diagnosis present

## 2018-08-13 DIAGNOSIS — J189 Pneumonia, unspecified organism: Principal | ICD-10-CM | POA: Diagnosis present

## 2018-08-13 DIAGNOSIS — R531 Weakness: Secondary | ICD-10-CM | POA: Diagnosis not present

## 2018-08-13 LAB — I-STAT CHEM 8, ED
BUN: 9 mg/dL (ref 6–20)
CHLORIDE: 100 mmol/L (ref 98–111)
CREATININE: 0.7 mg/dL (ref 0.44–1.00)
Calcium, Ion: 1.07 mmol/L — ABNORMAL LOW (ref 1.15–1.40)
GLUCOSE: 105 mg/dL — AB (ref 70–99)
HCT: 36 % (ref 36.0–46.0)
Hemoglobin: 12.2 g/dL (ref 12.0–15.0)
POTASSIUM: 3.3 mmol/L — AB (ref 3.5–5.1)
Sodium: 132 mmol/L — ABNORMAL LOW (ref 135–145)
TCO2: 19 mmol/L — ABNORMAL LOW (ref 22–32)

## 2018-08-13 LAB — COMPREHENSIVE METABOLIC PANEL
ALBUMIN: 2.9 g/dL — AB (ref 3.5–5.0)
ALK PHOS: 148 U/L — AB (ref 38–126)
ALT: 44 U/L (ref 0–44)
ANION GAP: 13 (ref 5–15)
AST: 16 U/L (ref 15–41)
BILIRUBIN TOTAL: 1.1 mg/dL (ref 0.3–1.2)
BUN: 11 mg/dL (ref 6–20)
CALCIUM: 8.5 mg/dL — AB (ref 8.9–10.3)
CO2: 20 mmol/L — AB (ref 22–32)
Chloride: 99 mmol/L (ref 98–111)
Creatinine, Ser: 0.78 mg/dL (ref 0.44–1.00)
GFR calc Af Amer: >60 mL/min (ref >60)
GFR calc non Af Amer: >60 mL/min (ref >60)
GLUCOSE: 108 mg/dL — AB (ref 70–99)
Potassium: 3.3 mmol/L — ABNORMAL LOW (ref 3.5–5.1)
SODIUM: 132 mmol/L — AB (ref 135–145)
TOTAL PROTEIN: 7.2 g/dL (ref 6.5–8.1)

## 2018-08-13 LAB — CBC WITH DIFFERENTIAL/PLATELET
BASOS ABS: 0 10*3/uL (ref 0.0–0.1)
BASOS PCT: 0 %
EOS ABS: 0.2 10*3/uL (ref 0.0–0.7)
Eosinophils Relative: 1 %
HEMATOCRIT: 35.8 % — AB (ref 36.0–46.0)
HEMOGLOBIN: 12.5 g/dL (ref 12.0–15.0)
Lymphocytes Relative: 5 %
Lymphs Abs: 0.9 10*3/uL (ref 0.7–4.0)
MCH: 30.8 pg (ref 26.0–34.0)
MCHC: 34.9 g/dL (ref 30.0–36.0)
MCV: 88.2 fL (ref 78.0–100.0)
Monocytes Absolute: 0.3 10*3/uL (ref 0.1–1.0)
Monocytes Relative: 2 %
NEUTROS ABS: 17.3 10*3/uL — AB (ref 1.7–7.7)
Neutrophils Relative %: 92 %
Platelets: 794 10*3/uL — ABNORMAL HIGH (ref 150–400)
RBC: 4.06 MIL/uL (ref 3.87–5.11)
RDW: 12.8 % (ref 11.5–15.5)
WBC: 18.7 10*3/uL — AB (ref 4.0–10.5)

## 2018-08-13 MED ORDER — SODIUM CHLORIDE 0.9 % IV BOLUS
1000.0000 mL | Freq: Once | INTRAVENOUS | Status: AC
  Administered 2018-08-13: 1000 mL via INTRAVENOUS

## 2018-08-13 MED ORDER — ONDANSETRON HCL 4 MG/2ML IJ SOLN
4.0000 mg | Freq: Once | INTRAMUSCULAR | Status: AC
  Administered 2018-08-13: 4 mg via INTRAVENOUS
  Filled 2018-08-13: qty 2

## 2018-08-13 NOTE — ED Triage Notes (Signed)
Pt started feeling weak/sob this morning at 2am with vomiting. Pt was down in lobby upon RN arrival. Does not remember getting in the floor. Pt reports feeling sob unable to breathe. Pt o2 sat 96% on RA.  Pt was recently taken off pneumonia medications to be put on c.diff medication.

## 2018-08-13 NOTE — ED Provider Notes (Signed)
[facility] EMERGENCY DEPARTMENT Provider Note   CSN: [id number] Arrival date & time: 08/13/18  2301     History   Chief Complaint Chief Complaint  Patient presents with  . Shortness of Breath  . Weakness    HPI Quana L Blass is a 52 y.o. female.  Patient presents to the emergency department for evaluation of generalized weakness with shortness of breath.  Patient reports that she has been sick for 9 months.  She has lost 60 pounds.  She has been seen by multiple "quacks" who have told her that it is "all in my head".  She reports that earlier this morning she started having nausea and vomiting and has not been able to hold anything down.  She is extremely weak.     Past Medical History:  Diagnosis Date  . Depression   . Hypertension   . IBS (irritable bowel syndrome)     Patient Active Problem List   Diagnosis Date Noted  . Pneumonia 08/14/2018  . Loss of weight 06/15/2018  . Abdominal pain 03/11/2018  . Loose stools 03/11/2018    Past Surgical History:  Procedure Laterality Date  . APPENDECTOMY    . CESAREAN SECTION    . COLONOSCOPY N/A 05/04/2018   Procedure: COLONOSCOPY;  Surgeon: Daneil Dolin, MD;  Location: [facility]O SUITE;  Service: Endoscopy;  Laterality: N/A;  2:00pm  . PILONIDAL CYST EXCISION    . ROTATOR CUFF REPAIR Right      OB History   None      Home Medications    Prior to Admission medications   Medication Sig Start Date End Date Taking? Authorizing Provider  acetaminophen (TYLENOL) 650 MG CR tablet Take 650 mg by mouth every 8 (eight) hours as needed for pain.    [provider]  azithromycin (ZITHROMAX) 250 MG tablet 1 tablet daily starting Tuesday afternoon for 4 days 08/09/18   Nat Christen, MD  clidinium-chlordiazePOXIDE (LIBRAX) 5-2.5 MG capsule Take 1 capsule by mouth daily as needed (IBS).     [provider]  dicyclomine (BENTYL) 10 MG capsule Take 1 capsule (10 mg total) by mouth 3 (three) times daily before  meals. Patient taking differently: Take 10 mg by mouth as needed.  03/15/18   Carlis Stable, NP  venlafaxine XR (EFFEXOR-XR) 75 MG 24 hr capsule Take 75 mg by mouth daily.  03/02/18   [provider]    Family History Family History  Problem Relation Age of Onset  . Gastric cancer Father   . Colon cancer Neg Hx   . Esophageal cancer Neg Hx     Social History Social History   Tobacco Use  . Smoking status: Current Every Day Smoker    Packs/day: 1.00    Types: Cigarettes  . Smokeless tobacco: Never Used  Substance Use Topics  . Alcohol use: No  . Drug use: Yes    Types: Marijuana     Allergies   Patient has no known allergies.   Review of Systems Review of Systems  Constitutional: Positive for fatigue.  Gastrointestinal: Positive for nausea and vomiting.  All other systems reviewed and are negative.    Physical Exam Updated Vital Signs BP 107/63   Pulse 69   Temp 98.7 F (37.1 C) (Oral)   Resp 20   Ht 5\' 6"  (1.676 m)   Wt 68 kg   LMP 12/03/2016 Comment: pt says periods stopped last jan  SpO2 96%   BMI 24.21 kg/m  Physical Exam  Constitutional: She is oriented to person, place, and time. She appears well-developed and well-nourished. No distress.  HENT:  Head: Normocephalic and atraumatic.  Right Ear: Hearing normal.  Left Ear: Hearing normal.  Nose: Nose normal.  Mouth/Throat: Oropharynx is clear and moist and mucous membranes are normal.  Eyes: Pupils are equal, round, and reactive to light. Conjunctivae and EOM are normal.  Neck: Normal range of motion. Neck supple.  Cardiovascular: Regular rhythm, S1 normal and S2 normal. Exam reveals no gallop and no friction rub.  No murmur heard. Pulmonary/Chest: Effort normal and breath sounds normal. No respiratory distress. She exhibits no tenderness.  Abdominal: Soft. Normal appearance and bowel sounds are normal. There is no hepatosplenomegaly. There is no tenderness. There is no rebound, no guarding,  no tenderness at McBurney's point and negative Murphy's sign. No hernia.  Musculoskeletal: Normal range of motion.  Neurological: She is alert and oriented to person, place, and time. She has normal strength. No cranial nerve deficit or sensory deficit. Coordination normal. GCS eye subscore is 4. GCS verbal subscore is 5. GCS motor subscore is 6.  Skin: Skin is warm, dry and intact. No rash noted. No cyanosis.  Psychiatric: She has a normal mood and affect. Her speech is normal and behavior is normal. Thought content normal.  Nursing note and vitals reviewed.    ED Treatments / Results  Labs (all labs ordered are listed, but only abnormal results are displayed) Labs Reviewed  CBC WITH DIFFERENTIAL/PLATELET - Abnormal; Notable for the following components:      Result Value   WBC 18.7 (*)    HCT 35.8 (*)    Platelets 794 (*)    Neutro Abs 17.3 (*)    All other components within normal limits  COMPREHENSIVE METABOLIC PANEL - Abnormal; Notable for the following components:   Sodium 132 (*)    Potassium 3.3 (*)    CO2 20 (*)    Glucose, Bld 108 (*)    Calcium 8.5 (*)    Albumin 2.9 (*)    Alkaline Phosphatase 148 (*)    All other components within normal limits  URINALYSIS, ROUTINE W REFLEX MICROSCOPIC - Abnormal; Notable for the following components:   Hgb urine dipstick SMALL (*)    Ketones, ur 80 (*)    Leukocytes, UA SMALL (*)    Bacteria, UA RARE (*)    All other components within normal limits  I-STAT CHEM 8, ED - Abnormal; Notable for the following components:   Sodium 132 (*)    Potassium 3.3 (*)    Glucose, Bld 105 (*)    Calcium, Ion 1.07 (*)    TCO2 19 (*)    All other components within normal limits  C DIFFICILE QUICK SCREEN W PCR REFLEX  TROPONIN I  LACTIC ACID, PLASMA  LACTIC ACID, PLASMA  I-STAT CG4 LACTIC ACID, ED    EKG EKG Interpretation  Date/Time:  Friday August 13 2018 23:09:05 EDT Ventricular Rate:  89 PR Interval:    QRS Duration: 75 QT  Interval:  351 QTC Calculation: 427 R Axis:   97 Text Interpretation:  Right and left arm electrode reversal, interpretation assumes no reversal Sinus or ectopic atrial rhythm Probable lateral infarct, age indeterminate Confirmed by Orpah Greek [phone]) on 08/13/2018 11:12:50 PM   Radiology Dg Chest 2 View  Result Date: 08/13/2018 CLINICAL DATA:  Chest pain, history of pneumonia EXAM: CHEST - 2 VIEW COMPARISON:  08/09/2018, 11/30/2017 FINDINGS: Patchy lingular opacity. No  pleural effusion. Normal heart size. No pneumothorax IMPRESSION: Patchy lingular opacity may reflect mild pneumonia. This does not appear significantly changed as compared with 08/09/2018 Electronically Signed   By: Donavan Foil M.D.   On: 08/13/2018 23:55   Ct Angio Chest Pe W Or Wo Contrast  Result Date: 08/14/2018 CLINICAL DATA:  Chest pain and shortness of breath. EXAM: CT ANGIOGRAPHY CHEST WITH CONTRAST TECHNIQUE: Multidetector CT imaging of the chest was performed using the standard protocol during bolus administration of intravenous contrast. Multiplanar CT image reconstructions and MIPs were obtained to evaluate the vascular anatomy. CONTRAST:  122mL ISOVUE-370 IOPAMIDOL (ISOVUE-370) INJECTION 76% COMPARISON:  Radiographs yesterday. FINDINGS: Cardiovascular: There are no filling defects within the pulmonary arteries to suggest pulmonary embolus. Thoracic aorta is normal in caliber without dissection. Heart is normal in size. Small volume pericardial fluid may be physiologic or small effusion. Mediastinum/Nodes: Shotty mediastinal and hilar adenopathy, largest right hilar node measuring 13 mm. Esophagus is nondistended. No visualized thyroid nodule. Lungs/Pleura: Multifocal geographic ground-glass opacities throughout all lobes of both lungs. There is slight peripheral and basilar predominance. Associated linear opacity in the lingula. Mild central bronchial thickening. No septal thickening. No pleural fluid. Upper  Abdomen: Simple cyst in the left lobe the liver. Liquid stool in the colon. Musculoskeletal: There are no acute or suspicious osseous abnormalities. Review of the MIP images confirms the above findings. IMPRESSION: 1. No pulmonary embolus. 2. Multifocal geographic ground-glass opacities throughout the lungs, slight peripheral and basilar predominance. Findings are nonspecific and may be infectious or inflammatory. Associated linear opacity in the lingula most consistent with atelectasis. 3. Shotty mediastinal and right hilar lymph nodes are likely reactive. Electronically Signed   By: Keith Rake M.D.   On: 08/14/2018 02:15    Procedures Procedures (including critical care time)  Medications Ordered in ED Medications  methylPREDNISolone sodium succinate (SOLU-MEDROL) 125 mg/2 mL injection 125 mg (has no administration in time range)  cefTRIAXone (ROCEPHIN) 1 g in sodium chloride 0.9 % 100 mL IVPB (has no administration in time range)  azithromycin (ZITHROMAX) 500 mg in sodium chloride 0.9 % 250 mL IVPB (has no administration in time range)  sodium chloride 0.9 % bolus 1,000 mL (0 mLs Intravenous Stopped 08/14/18 0124)  ondansetron (ZOFRAN) injection 4 mg (4 mg Intravenous Given 08/13/18 2330)  iopamidol (ISOVUE-370) 76 % injection 100 mL (100 mLs Intravenous Contrast Given 08/14/18 0136)     Initial Impression / Assessment and Plan / ED Course  I have reviewed the triage vital signs and the nursing notes.  Pertinent labs & imaging results that were available during my care of the patient were reviewed by me and considered in my medical decision making (see chart for details).     Presents with a long-standing illness.  She reports that for 9 months she has been ill, has been seen in the ER and by multiple physicians who have not been able to figure out why she feels so poorly.  She has been having generalized weakness, weight loss.  She reports diarrhea that has been profuse.  Every time  she eats she has diarrhea stools.  She also cannot eat because of nausea and vomiting.  She has been seen by gastroenterology as well as outpatient clinics and here in the ER.  Patient comes in today with persistent diarrhea, vomiting, inability to hold down oral intake.  She has been feeling more weak than usual.  She has also developed shortness of breath over the last couple of  days.  She was in the ER 4 days ago and was told she had pneumonia.  She was prescribed Zithromax.  She has not taken this, because her PCP advised her not to.  He thinks that she might have C. difficile colitis, has sent a stool sample today but results are not back.  Chest x-ray once again showed the abnormality in the lingular area.  This would not seem to be enough to explain the level of shortness of breath she is describing.  She underwent CT angiography.  No PE is seen, but she does have diffuse bilateral groundglass opacities that could be infectious or inflammatory.  Differential diagnosis would be pneumonia and hypersensitivity lung disease.  Will initiate Rocephin, Zithromax and Solu-Medrol.  Will attempt to obtain additional C. difficile study.  Admit to hospitalist service.  Final Clinical Impressions(s) / ED Diagnoses   Final diagnoses:  Community acquired pneumonia, unspecified laterality    ED Discharge Orders    None       Pollina, Gwenyth Allegra, MD 08/14/18 [phone]

## 2018-08-14 ENCOUNTER — Emergency Department (HOSPITAL_COMMUNITY): Payer: 59

## 2018-08-14 ENCOUNTER — Encounter (HOSPITAL_COMMUNITY): Payer: Self-pay | Admitting: Family Medicine

## 2018-08-14 DIAGNOSIS — R0602 Shortness of breath: Secondary | ICD-10-CM | POA: Diagnosis not present

## 2018-08-14 DIAGNOSIS — R112 Nausea with vomiting, unspecified: Secondary | ICD-10-CM | POA: Diagnosis present

## 2018-08-14 DIAGNOSIS — J189 Pneumonia, unspecified organism: Secondary | ICD-10-CM | POA: Diagnosis present

## 2018-08-14 DIAGNOSIS — R197 Diarrhea, unspecified: Secondary | ICD-10-CM

## 2018-08-14 DIAGNOSIS — E876 Hypokalemia: Secondary | ICD-10-CM | POA: Diagnosis not present

## 2018-08-14 DIAGNOSIS — E871 Hypo-osmolality and hyponatremia: Secondary | ICD-10-CM | POA: Diagnosis not present

## 2018-08-14 DIAGNOSIS — R7989 Other specified abnormal findings of blood chemistry: Secondary | ICD-10-CM

## 2018-08-14 DIAGNOSIS — F419 Anxiety disorder, unspecified: Secondary | ICD-10-CM | POA: Diagnosis not present

## 2018-08-14 DIAGNOSIS — D75838 Other thrombocytosis: Secondary | ICD-10-CM | POA: Diagnosis present

## 2018-08-14 LAB — URINALYSIS, ROUTINE W REFLEX MICROSCOPIC
Bilirubin Urine: NEGATIVE
Glucose, UA: NEGATIVE mg/dL
Ketones, ur: 80 mg/dL — AB
Nitrite: NEGATIVE
PH: 6 (ref 5.0–8.0)
Protein, ur: NEGATIVE mg/dL
SPECIFIC GRAVITY, URINE: 1.026 (ref 1.005–1.030)

## 2018-08-14 LAB — TROPONIN I: Troponin I: <0.03 ng/mL (ref <0.03)

## 2018-08-14 LAB — LACTIC ACID, PLASMA
Lactic Acid, Venous: 0.9 mmol/L (ref 0.5–1.9)
Lactic Acid, Venous: 0.9 mmol/L (ref 0.5–1.9)

## 2018-08-14 LAB — BASIC METABOLIC PANEL
ANION GAP: 11 (ref 5–15)
BUN: 10 mg/dL (ref 6–20)
CO2: 22 mmol/L (ref 22–32)
CREATININE: 0.73 mg/dL (ref 0.44–1.00)
Calcium: 8.6 mg/dL — ABNORMAL LOW (ref 8.9–10.3)
Chloride: 103 mmol/L (ref 98–111)
GFR calc Af Amer: >60 mL/min (ref >60)
GFR calc non Af Amer: >60 mL/min (ref >60)
Glucose, Bld: 125 mg/dL — ABNORMAL HIGH (ref 70–99)
POTASSIUM: 3.6 mmol/L (ref 3.5–5.1)
SODIUM: 136 mmol/L (ref 135–145)

## 2018-08-14 LAB — MAGNESIUM: MAGNESIUM: 2.1 mg/dL (ref 1.7–2.4)

## 2018-08-14 LAB — PROCALCITONIN: Procalcitonin: 0.2 ng/mL

## 2018-08-14 MED ORDER — INFLUENZA VAC SPLIT QUAD 0.5 ML IM SUSY
0.5000 mL | PREFILLED_SYRINGE | INTRAMUSCULAR | Status: AC
  Administered 2018-08-15: 0.5 mL via INTRAMUSCULAR
  Filled 2018-08-14: qty 0.5

## 2018-08-14 MED ORDER — SODIUM CHLORIDE 0.9 % IV SOLN
INTRAVENOUS | Status: DC | PRN
  Administered 2018-08-14: 250 mL via INTRAVENOUS

## 2018-08-14 MED ORDER — BOOST / RESOURCE BREEZE PO LIQD CUSTOM
1.0000 | Freq: Three times a day (TID) | ORAL | Status: DC
  Administered 2018-08-14 – 2018-08-15 (×3): 1 via ORAL

## 2018-08-14 MED ORDER — IOPAMIDOL (ISOVUE-370) INJECTION 76%
100.0000 mL | Freq: Once | INTRAVENOUS | Status: AC | PRN
  Administered 2018-08-14: 100 mL via INTRAVENOUS

## 2018-08-14 MED ORDER — DICYCLOMINE HCL 10 MG PO CAPS: 10.0000 mg | ORAL_CAPSULE | Freq: Three times a day (TID) | ORAL | Status: DC | PRN

## 2018-08-14 MED ORDER — METHYLPREDNISOLONE SODIUM SUCC 125 MG IJ SOLR
125.0000 mg | Freq: Once | INTRAMUSCULAR | Status: AC
  Administered 2018-08-14: 125 mg via INTRAVENOUS
  Filled 2018-08-14: qty 2

## 2018-08-14 MED ORDER — SODIUM CHLORIDE 0.9 % IV SOLN
500.0000 mg | INTRAVENOUS | Status: DC
  Administered 2018-08-14: 500 mg via INTRAVENOUS
  Filled 2018-08-14 (×3): qty 500

## 2018-08-14 MED ORDER — ENOXAPARIN SODIUM 40 MG/0.4ML ~~LOC~~ SOLN
40.0000 mg | SUBCUTANEOUS | Status: DC
  Administered 2018-08-14 – 2018-08-15 (×2): 40 mg via SUBCUTANEOUS
  Filled 2018-08-14 (×2): qty 0.4

## 2018-08-14 MED ORDER — ONDANSETRON HCL 4 MG/2ML IJ SOLN: 4.0000 mg | Freq: Four times a day (QID) | INTRAMUSCULAR | Status: DC | PRN

## 2018-08-14 MED ORDER — POTASSIUM CHLORIDE IN NACL 40-0.9 MEQ/L-% IV SOLN
INTRAVENOUS | Status: DC
  Administered 2018-08-14: 75 mL/h via INTRAVENOUS
  Administered 2018-08-14: 100 mL/h via INTRAVENOUS
  Administered 2018-08-15: 75 mL/h via INTRAVENOUS
  Filled 2018-08-14 (×2): qty 1000

## 2018-08-14 MED ORDER — ADULT MULTIVITAMIN LIQUID CH
15.0000 mL | Freq: Every day | ORAL | Status: DC
  Administered 2018-08-14 – 2018-08-15 (×2): 15 mL via ORAL
  Filled 2018-08-14 (×3): qty 15

## 2018-08-14 MED ORDER — VENLAFAXINE HCL ER 37.5 MG PO CP24
75.0000 mg | ORAL_CAPSULE | Freq: Every day | ORAL | Status: DC
  Administered 2018-08-14: 75 mg via ORAL
  Filled 2018-08-14 (×3): qty 2

## 2018-08-14 MED ORDER — SODIUM CHLORIDE 0.9 % IV SOLN
500.0000 mg | INTRAVENOUS | Status: DC
  Administered 2018-08-15: 500 mg via INTRAVENOUS
  Filled 2018-08-14 (×2): qty 500

## 2018-08-14 MED ORDER — ACETAMINOPHEN 325 MG PO TABS: 650.0000 mg | ORAL_TABLET | Freq: Four times a day (QID) | ORAL | Status: DC | PRN

## 2018-08-14 MED ORDER — CILIDINIUM-CHLORDIAZEPOXIDE 2.5-5 MG PO CAPS
1.0000 | ORAL_CAPSULE | Freq: Every day | ORAL | Status: DC | PRN
  Filled 2018-08-14: qty 1

## 2018-08-14 MED ORDER — SODIUM CHLORIDE 0.9 % IV SOLN
1.0000 g | INTRAVENOUS | Status: DC
  Administered 2018-08-15: 1 g via INTRAVENOUS
  Filled 2018-08-14: qty 1
  Filled 2018-08-14: qty 10

## 2018-08-14 MED ORDER — ACETAMINOPHEN 650 MG RE SUPP: 650.0000 mg | Freq: Four times a day (QID) | RECTAL | Status: DC | PRN

## 2018-08-14 MED ORDER — ONDANSETRON HCL 4 MG PO TABS: 4.0000 mg | ORAL_TABLET | Freq: Four times a day (QID) | ORAL | Status: DC | PRN

## 2018-08-14 MED ORDER — SODIUM CHLORIDE 0.9 % IV SOLN
1.0000 g | Freq: Once | INTRAVENOUS | Status: AC
  Administered 2018-08-14: 1 g via INTRAVENOUS
  Filled 2018-08-14: qty 10

## 2018-08-14 NOTE — Progress Notes (Signed)
08/13/2018 11:05 PM  08/14/2018 1:49 PM  Dawn Edwards was seen and examined.  The H&P by the admitting provider, orders, imaging was reviewed.  Please see new orders.  Will continue to follow.   Murvin Natal, MD [facility]

## 2018-08-14 NOTE — ED Notes (Signed)
Patient back in room from CT scan.

## 2018-08-14 NOTE — Progress Notes (Signed)
Initial Nutrition Assessment  DOCUMENTATION CODES:  Not applicable  INTERVENTION:  Will try lower fat, gluten/lactose free supplement, as will likely be more tolerable than Ensure:  Boost Breeze po TID, each supplement provides 250 kcal and 9 grams of protein  Liquid MVI with minerals  NUTRITION DIAGNOSIS:  Unintentional weight loss related to diarrhea, altered GI function as evidenced by a loss of 16% bw in 8.5 months  GOAL:  Patient will meet greater than or equal to 90% of their needs  MONITOR:  PO intake, Supplement acceptance, Diet advancement, Weight trends, Labs, I & O's  REASON FOR ASSESSMENT:  Malnutrition Screening Tool    ASSESSMENT:  52 y/o female PMHx Depression, Anxiety, HTN, IBS. Presents w/ several months of diarrhea (started in Jan), loss of appetite, and weight loss. More recently developed SOB, fevers, vomiting. Work up suggests PNA. Admitted for management.   RD operating remotely on weekends. Per chart, the patient has been followed by OP GI and PCP for IBS. This apparently has improved recently on bentyl and librax.   Weight wise, it looks that patient reported a loss of 60 lbs in 9 months, however her objective history shows a loss 30 lbs in about 8.5 months. She was admitted today at 150.6 lbs and she was 179 lbs at the end of Jan. Still, this is a loss of 16% bw, which falls in line with malnutrition criteria.   ED note is particularly telling. Patient's diarrhea apparently worsens with any PO intake and every time she eats, she has loose stools. She also experiences severe n/v. As a result of these GI symptoms, she cannot eat.   Per review of GI encounters, it doesn't appear she has not completed extensive workup. Did have a colonoscopy, but she deferred EGD. Doesn't appear she has had any testing for celiacs either.   Labs: Glu:125, Albumin:2.9, WBC:18.7  Meds: Resource breeze, iv abx, ivf  Recent Labs  Lab 08/09/18 1109 08/13/18 2320  08/13/18 2329 08/14/18 0636  NA 135 132* 132* 136  K 3.7 3.3* 3.3* 3.6  CL 102 99 100 103  CO2 25 20*  --  22  BUN 10 11 9 10   CREATININE 0.70 0.78 0.70 0.73  CALCIUM 8.6* 8.5*  --  8.6*  MG  --   --   --  2.1  GLUCOSE 109* 108* 105* 125*   NUTRITION - FOCUSED PHYSICAL EXAM: Unable to assess  Diet Order:   Diet Order            Diet clear liquid Room service appropriate? Yes; Fluid consistency: Thin  Diet effective now             EDUCATION NEEDS:  Not appropriate for education at this time  Skin:  Skin Assessment: Reviewed RN Assessment  Last BM:  10/4  Height:  Ht Readings from Last 1 Encounters:  08/14/18 5\' 6"  (1.676 m)   Weight:  Wt Readings from Last 1 Encounters:  08/14/18 68.3 kg   Wt Readings from Last 10 Encounters:  08/14/18 68.3 kg  08/09/18 68 kg  06/15/18 71.8 kg  05/04/18 71.7 kg  03/11/18 77.5 kg  12/03/17 81.2 kg   Ideal Body Weight:  59.1 kg  BMI:  Body mass index is 24.3 kg/m.  Estimated Nutritional Needs:  Kcal:  1700-1900 (25-28 kcal/kg bw) Protein:  75-90g Pro (1.1-1.3 g/kg bw) Fluid:  1.7-1.9 L (9ml/kcal) + enough to replace stool losses  Burtis Junes RD, LDN, CNSC Clinical Nutrition Available Tues-Sat  via Pager: [phone] 08/14/2018 9:07 AM

## 2018-08-14 NOTE — H&P (Signed)
History and Physical    Dawn Edwards ZOX:[id number] DOB: 1965-12-03 DOA: 08/13/2018  PCP: Yves Dill, NP   Patient coming from: Home   Chief Complaint: Fever, SOB, N/V/D  HPI: Dawn Edwards is a 52 y.o. female with medical history significant for depression with anxiety, IBS, and several months of diarrhea with loss of appetite and progressive weight loss, now presenting to the emergency department for evaluation of shortness of breath, ongoing diarrhea, fevers, and new development of nonbloody vomiting.  Patient reports developing abdominal cramping and diarrhea in January of this year with loss of appetite and progressive weight loss.  She has been managed for this by her PCP and gastroenterology, had been treated with Pepcid, and now on Bentyl and Librax which may have been helping some.  She had a colonoscopy with polyps (tubular adenomas on pathology).  Over the past few days, she has developed shortness of breath with fevers, chills, and has had some nonbloody vomiting the past couple days.  She denies abdominal pain per se, but reports generalized cramping discomfort that is intermittent.  She denies any melena or hematochezia.  Reports that she has not been coughing much.  She was recently seen in the ED and prescribed azithromycin for suspected CAP but did not take it, reportedly at the direction of her PCP due to concern for C. difficile.  She reports dropping off a stool sample at the lab yesterday.  Denies recent travel or sick contacts.   ED Course: Upon arrival to the ED, patient is found to be afebrile, saturating low 90s on room air, slightly tachypneic, and with vitals otherwise stable.  EKG features a sinus or ectopic atrial rhythm.  Chest x-ray is notable for patchy lingular opacity.  CTA chest is negative for PE, but notable for multifocal groundglass opacities throughout the lungs which is nonspecific and could be infectious or inflammatory.  Chemistry panel is  notable for sodium of 132 and potassium 3.3.  CBC features a leukocytosis to 18,700 and thrombocytosis with platelets 794,000.  Lactic acid is reassuringly normal and troponin is undetectable.  Urinalysis notable for ketonuria.  Patient was given a liter of normal saline, Rocephin, azithromycin, and 125 mg of IV Solu-Medrol in the ED.  C. difficile testing was ordered from the ED.  Patient remains hemodynamically stable, not in acute respiratory distress, but obviously uncomfortable, and will be observed in the hospital for ongoing evaluation and management.  Review of Systems:  All other systems reviewed and apart from HPI, are negative.  Past Medical History:  Diagnosis Date  . Depression   . Hypertension   . IBS (irritable bowel syndrome)     Past Surgical History:  Procedure Laterality Date  . APPENDECTOMY    . CESAREAN SECTION    . COLONOSCOPY N/A 05/04/2018   Procedure: COLONOSCOPY;  Surgeon: Daneil Dolin, MD;  Location: [facility];  Service: Endoscopy;  Laterality: N/A;  2:00pm  . PILONIDAL CYST EXCISION    . ROTATOR CUFF REPAIR Right      reports that she has been smoking cigarettes. She has been smoking about 1.00 pack per day. She has never used smokeless tobacco. She reports that she has current or past drug history. Drug: Marijuana. She reports that she does not drink alcohol.  No Known Allergies  Family History  Problem Relation Age of Onset  . Gastric cancer Father   . Colon cancer Neg Hx   . Esophageal cancer Neg Hx  Prior to Admission medications   Medication Sig Start Date End Date Taking? Authorizing Provider  acetaminophen (TYLENOL) 650 MG CR tablet Take 650 mg by mouth every 8 (eight) hours as needed for pain.    [provider]  clidinium-chlordiazePOXIDE (LIBRAX) 5-2.5 MG capsule Take 1 capsule by mouth daily as needed (IBS).     [provider]  dicyclomine (BENTYL) 10 MG capsule Take 1 capsule (10 mg total) by mouth 3 (three)  times daily before meals. Patient taking differently: Take 10 mg by mouth as needed.  03/15/18   Carlis Stable, NP  venlafaxine XR (EFFEXOR-XR) 75 MG 24 hr capsule Take 75 mg by mouth daily.  03/02/18   [provider]    Physical Exam: Vitals:   08/14/18 0227 08/14/18 0230 08/14/18 0300 08/14/18 0330  BP:  107/89 119/74 (!) 105/54  Pulse: 69 69 70 68  Resp: 20 (!) 21 17 (!) 22  Temp:    97.8 F (36.6 C)  TempSrc:    Oral  SpO2: 96% 95% 92% 94%  Weight:      Height:         Constitutional: NAD, in apparent discomfort Eyes: PERTLA, lids and conjunctivae normal ENMT: Mucous membranes are moist. Posterior pharynx clear of any exudate or lesions.   Neck: normal, supple, no masses, no thyromegaly Respiratory: Scattered rales and rhonchi. No accessory muscle use.  Cardiovascular: S1 & S2 heard, regular rate and rhythm. No extremity edema.   Abdomen: No distension, soft, mild generalized tenderness without rebound pain or guarding. Bowel sounds active.  Musculoskeletal: no clubbing / cyanosis. No joint deformity upper and lower extremities.    Skin: no significant rashes, lesions, ulcers. Warm, dry, well-perfused. Neurologic: CN 2-12 grossly intact. Sensation intact. Strength 5/5 in all 4 limbs.  Psychiatric: Alert and oriented x 3. Calm, cooperative.    Labs on Admission: I have personally reviewed following labs and imaging studies  CBC: Recent Labs  Lab 08/09/18 1109 08/13/18 2320 08/13/18 2329  WBC 16.1* 18.7*  --   NEUTROABS  --  17.3*  --   HGB 12.3 12.5 12.2  HCT 37.0 35.8* 36.0  MCV 92.0 88.2  --   PLT 572* 794*  --    Basic Metabolic Panel: Recent Labs  Lab 08/09/18 1109 08/13/18 2320 08/13/18 2329  NA 135 132* 132*  K 3.7 3.3* 3.3*  CL 102 99 100  CO2 25 20*  --   GLUCOSE 109* 108* 105*  BUN 10 11 9   CREATININE 0.70 0.78 0.70  CALCIUM 8.6* 8.5*  --    GFR: Estimated Creatinine Clearance: 76.1 mL/min (by C-G formula based on SCr of 0.7  mg/dL). Liver Function Tests: Recent Labs  Lab 08/09/18 1109 08/13/18 2320  AST 29 16  ALT 32 44  ALKPHOS 130* 148*  BILITOT 0.7 1.1  PROT 7.2 7.2  ALBUMIN 3.2* 2.9*   Recent Labs  Lab 08/09/18 1109  LIPASE 26   No results for input(s): AMMONIA in the last 168 hours. Coagulation Profile: No results for input(s): INR, PROTIME in the last 168 hours. Cardiac Enzymes: Recent Labs  Lab 08/13/18 2350  TROPONINI <0.03   BNP (last 3 results) No results for input(s): PROBNP in the last 8760 hours. HbA1C: No results for input(s): HGBA1C in the last 72 hours. CBG: No results for input(s): GLUCAP in the last 168 hours. Lipid Profile: No results for input(s): CHOL, HDL, LDLCALC, TRIG, CHOLHDL, LDLDIRECT in the last 72 hours. Thyroid Function  Tests: No results for input(s): TSH, T4TOTAL, FREET4, T3FREE, THYROIDAB in the last 72 hours. Anemia Panel: No results for input(s): VITAMINB12, FOLATE, FERRITIN, TIBC, IRON, RETICCTPCT in the last 72 hours. Urine analysis:    Component Value Date/Time   COLORURINE YELLOW 08/14/2018 0031   APPEARANCEUR CLEAR 08/14/2018 0031   LABSPEC 1.026 08/14/2018 0031   PHURINE 6.0 08/14/2018 0031   GLUCOSEU NEGATIVE 08/14/2018 0031   HGBUR SMALL (A) 08/14/2018 0031   BILIRUBINUR NEGATIVE 08/14/2018 0031   KETONESUR 80 (A) 08/14/2018 0031   PROTEINUR NEGATIVE 08/14/2018 0031   NITRITE NEGATIVE 08/14/2018 0031   LEUKOCYTESUR SMALL (A) 08/14/2018 0031   Sepsis Labs: @LABRCNTIP (procalcitonin:4,lacticidven:4) )No results found for this or any previous visit (from the past 240 hour(s)).   Radiological Exams on Admission: Dg Chest 2 View  Result Date: 08/13/2018 CLINICAL DATA:  Chest pain, history of pneumonia EXAM: CHEST - 2 VIEW COMPARISON:  08/09/2018, 11/30/2017 FINDINGS: Patchy lingular opacity. No pleural effusion. Normal heart size. No pneumothorax IMPRESSION: Patchy lingular opacity may reflect mild pneumonia. This does not appear  significantly changed as compared with 08/09/2018 Electronically Signed   By: Donavan Foil M.D.   On: 08/13/2018 23:55   Ct Angio Chest Pe W Or Wo Contrast  Result Date: 08/14/2018 CLINICAL DATA:  Chest pain and shortness of breath. EXAM: CT ANGIOGRAPHY CHEST WITH CONTRAST TECHNIQUE: Multidetector CT imaging of the chest was performed using the standard protocol during bolus administration of intravenous contrast. Multiplanar CT image reconstructions and MIPs were obtained to evaluate the vascular anatomy. CONTRAST:  175mL ISOVUE-370 IOPAMIDOL (ISOVUE-370) INJECTION 76% COMPARISON:  Radiographs yesterday. FINDINGS: Cardiovascular: There are no filling defects within the pulmonary arteries to suggest pulmonary embolus. Thoracic aorta is normal in caliber without dissection. Heart is normal in size. Small volume pericardial fluid may be physiologic or small effusion. Mediastinum/Nodes: Shotty mediastinal and hilar adenopathy, largest right hilar node measuring 13 mm. Esophagus is nondistended. No visualized thyroid nodule. Lungs/Pleura: Multifocal geographic ground-glass opacities throughout all lobes of both lungs. There is slight peripheral and basilar predominance. Associated linear opacity in the lingula. Mild central bronchial thickening. No septal thickening. No pleural fluid. Upper Abdomen: Simple cyst in the left lobe the liver. Liquid stool in the colon. Musculoskeletal: There are no acute or suspicious osseous abnormalities. Review of the MIP images confirms the above findings. IMPRESSION: 1. No pulmonary embolus. 2. Multifocal geographic ground-glass opacities throughout the lungs, slight peripheral and basilar predominance. Findings are nonspecific and may be infectious or inflammatory. Associated linear opacity in the lingula most consistent with atelectasis. 3. Shotty mediastinal and right hilar lymph nodes are likely reactive. Electronically Signed   By: Keith Rake M.D.   On: 08/14/2018 02:15     EKG: Independently reviewed. Sinus or ectopic atrial rhythm.   Assessment/Plan   1. Pneumonia  - Presents with SOB, fevers/chills, and N/V  - She had been prescribed azithromycin a few days ago for suspected CAP but did not take d/t concern for C diff  - CTA chest negative for PE but notable for non-specific multifocal ground-glass opacities  - With acute development of SOB, subjective fever/chills, and marked leukocytosis, infectious etiology felt to be most likely  - Check sputum culture, strep pneumo antigen, and procalcitonin - Continue treatment with empiric Rocephin and azithromycin    2. Diarrhea  - Reports non-bloody diarrhea since January  - Send stool for C diff testing, maintain enteric precautions for now, continue supportive care with IVF hydration and repletion of electrolytes  3. Hyponatremia - Serum sodium is 132 in setting of hypovolemia  - Treated with 1 liter IVF in ED and continued on normal saline infusion  - Repeat chem panel in am    4. Hypokalemia  - Potassium is 3.3 in ED  - Secondary to GI-losses  - KCl added to IVF  - Repeat chem panel in am    5. Anxiety  - Continue Effexor    6. Thrombocytosis  - Platelets 794,000 on admission  - Has been elevated since January and increasing  - Likely reactive; continue eval and tx of underlying illnesses     DVT prophylaxis: Lovenox  Code Status: Full  Family Communication: Significant other updated at bedside Consults called: None Admission status: Observation     Vianne Bulls, MD [facility] Pager [phone]  If 7PM-7AM, please contact night-coverage [email or web address] Password [email or web address]  08/14/2018, 4:03 AM

## 2018-08-14 NOTE — ED Notes (Signed)
Dr. Betsey Holiday notified of potassium 3.3

## 2018-08-15 DIAGNOSIS — E876 Hypokalemia: Secondary | ICD-10-CM | POA: Diagnosis not present

## 2018-08-15 DIAGNOSIS — J189 Pneumonia, unspecified organism: Secondary | ICD-10-CM | POA: Diagnosis not present

## 2018-08-15 DIAGNOSIS — R112 Nausea with vomiting, unspecified: Secondary | ICD-10-CM | POA: Diagnosis not present

## 2018-08-15 DIAGNOSIS — E871 Hypo-osmolality and hyponatremia: Secondary | ICD-10-CM | POA: Diagnosis not present

## 2018-08-15 LAB — CBC WITH DIFFERENTIAL/PLATELET
BASOS PCT: 0 %
Basophils Absolute: 0 10*3/uL (ref 0.0–0.1)
EOS PCT: 0 %
Eosinophils Absolute: 0 10*3/uL (ref 0.0–0.7)
HEMATOCRIT: 31 % — AB (ref 36.0–46.0)
Hemoglobin: 10.4 g/dL — ABNORMAL LOW (ref 12.0–15.0)
Lymphocytes Relative: 7 %
Lymphs Abs: 1.7 10*3/uL (ref 0.7–4.0)
MCH: 30.6 pg (ref 26.0–34.0)
MCHC: 33.5 g/dL (ref 30.0–36.0)
MCV: 91.2 fL (ref 78.0–100.0)
MONO ABS: 0.5 10*3/uL (ref 0.1–1.0)
Monocytes Relative: 2 %
NEUTROS ABS: 22 10*3/uL — AB (ref 1.7–7.7)
Neutrophils Relative %: 91 %
PLATELETS: 795 10*3/uL — AB (ref 150–400)
RBC: 3.4 MIL/uL — AB (ref 3.87–5.11)
RDW: 13.3 % (ref 11.5–15.5)
WBC: 24.2 10*3/uL — AB (ref 4.0–10.5)

## 2018-08-15 LAB — COMPREHENSIVE METABOLIC PANEL
ALBUMIN: 2.4 g/dL — AB (ref 3.5–5.0)
ALT: 28 U/L (ref 0–44)
AST: 12 U/L — ABNORMAL LOW (ref 15–41)
Alkaline Phosphatase: 108 U/L (ref 38–126)
Anion gap: 6 (ref 5–15)
BUN: 15 mg/dL (ref 6–20)
CHLORIDE: 110 mmol/L (ref 98–111)
CO2: 22 mmol/L (ref 22–32)
Calcium: 8.6 mg/dL — ABNORMAL LOW (ref 8.9–10.3)
Creatinine, Ser: 0.58 mg/dL (ref 0.44–1.00)
GFR calc Af Amer: >60 mL/min (ref >60)
GFR calc non Af Amer: >60 mL/min (ref >60)
GLUCOSE: 116 mg/dL — AB (ref 70–99)
POTASSIUM: 4.3 mmol/L (ref 3.5–5.1)
Sodium: 138 mmol/L (ref 135–145)
Total Bilirubin: 0.3 mg/dL (ref 0.3–1.2)
Total Protein: 6 g/dL — ABNORMAL LOW (ref 6.5–8.1)

## 2018-08-15 LAB — HIV ANTIBODY (ROUTINE TESTING W REFLEX): HIV SCREEN 4TH GENERATION: NONREACTIVE

## 2018-08-15 LAB — MAGNESIUM: Magnesium: 2.1 mg/dL (ref 1.7–2.4)

## 2018-08-15 LAB — STREP PNEUMONIAE URINARY ANTIGEN: STREP PNEUMO URINARY ANTIGEN: NEGATIVE

## 2018-08-15 MED ORDER — AMOXICILLIN 875 MG PO TABS: 875.0000 mg | ORAL_TABLET | Freq: Two times a day (BID) | ORAL | 0 refills | Status: AC

## 2018-08-15 MED ORDER — DOXYCYCLINE HYCLATE 100 MG PO CAPS: 100.0000 mg | ORAL_CAPSULE | Freq: Two times a day (BID) | ORAL | 0 refills | Status: AC

## 2018-08-15 MED ORDER — SACCHAROMYCES BOULARDII 250 MG PO PACK: 250.0000 mg | PACK | Freq: Two times a day (BID) | ORAL | 0 refills | Status: AC

## 2018-08-15 NOTE — Discharge Summary (Signed)
Physician Discharge Summary  Dawn Edwards:[id number] DOB: 04-Mar-1966 DOA: 08/13/2018  PCP: Yves Dill, NP  Admit date: 08/13/2018 Discharge date: 08/15/2018  Admitted From: Home  Disposition: Home   Recommendations for Outpatient Follow-up:  1. Follow up with PCP in 3 days as scheduled 2. Follow up with GI asap   Discharge Condition: STABLE   CODE STATUS: FULL    Brief Hospitalization Summary: Please see all hospital notes, images, labs for full details of the hospitalization. HPI: Dawn Edwards is a 52 y.o. female with medical history significant for depression with anxiety, IBS, and several months of diarrhea with loss of appetite and progressive weight loss, now presenting to the emergency department for evaluation of shortness of breath, ongoing diarrhea, fevers, and new development of nonbloody vomiting.  Patient reports developing abdominal cramping and diarrhea in January of this year with loss of appetite and progressive weight loss.  She has been managed for this by her PCP and gastroenterology, had been treated with Pepcid, and now on Bentyl and Librax which may have been helping some.  She had a colonoscopy with polyps (tubular adenomas on pathology).  Over the past few days, she has developed shortness of breath with fevers, chills, and has had some nonbloody vomiting the past couple days.  She denies abdominal pain per se, but reports generalized cramping discomfort that is intermittent.  She denies any melena or hematochezia.  Reports that she has not been coughing much.  She was recently seen in the ED and prescribed azithromycin for suspected CAP but did not take it, reportedly at the direction of her PCP due to concern for C. difficile.  She reports dropping off a stool sample at the lab yesterday.  Denies recent travel or sick contacts.   ED Course: Upon arrival to the ED, patient is found to be afebrile, saturating low 90s on room air, slightly tachypneic,  and with vitals otherwise stable.  EKG features a sinus or ectopic atrial rhythm.  Chest x-ray is notable for patchy lingular opacity.  CTA chest is negative for PE, but notable for multifocal groundglass opacities throughout the lungs which is nonspecific and could be infectious or inflammatory.  Chemistry panel is notable for sodium of 132 and potassium 3.3.  CBC features a leukocytosis to 18,700 and thrombocytosis with platelets 794,000.  Lactic acid is reassuringly normal and troponin is undetectable.  Urinalysis notable for ketonuria.  Patient was given a liter of normal saline, Rocephin, azithromycin, and 125 mg of IV Solu-Medrol in the ED.  C. difficile testing was ordered from the ED. Patient remains hemodynamically stable, not in acute respiratory distress, but obviously uncomfortable, and will be observed in the hospital for ongoing evaluation and management.  The patient was admitted with CAP.  The patient was started on IV antibiotics and supportive care. The patient was given IV solumedrol in the ED.  The patient says that she felt much better the next morning and wanted to go home.  She has been ambulating in the room.  She says that she has an appt with her PCP in 3 days.  She was having some diarrhea and we were going to have her tested for C diff but her stool was too formed for the test to be done. She had leukocytosis which is likely from the IV steroid dose that she was given yesterday.  She will be discharged on course of antibiotics - amoxicillin/doxycycline.  GI followup recommended as soon as possible to continue  work up on her weight loss and chronic diarrhea.  Pt verbalized understanding.    Discharge Diagnoses:  Principal Problem:   Atypical pneumonia Active Problems:   Nausea vomiting and diarrhea   Hyponatremia   Hypokalemia   Reactive thrombocytosis   Anxiety    Discharge Instructions: Discharge Instructions    Call MD for:  persistant nausea and vomiting   Complete  by:  As directed    Increase activity slowly   Complete by:  As directed      Allergies as of 08/15/2018      Reactions   Phenergan [promethazine Hcl]    Causes Restless Legs      Medication List    STOP taking these medications   doxycycline 100 MG capsule Commonly known as:  MONODOX     TAKE these medications   acetaminophen 650 MG CR tablet Commonly known as:  TYLENOL Take 650 mg by mouth every 6 (six) hours as needed for fever.   amoxicillin 875 MG tablet Commonly known as:  AMOXIL Take 1 tablet (875 mg total) by mouth 2 (two) times daily for 6 days.   doxycycline 100 MG capsule Commonly known as:  VIBRAMYCIN Take 1 capsule (100 mg total) by mouth 2 (two) times daily for 6 days.   metroNIDAZOLE 500 MG tablet Commonly known as:  FLAGYL Take 500 mg by mouth 3 (three) times daily.   nicotine 14 mg/24hr patch Commonly known as:  NICODERM CQ - dosed in mg/24 hours Place 14 mg onto the skin daily.   pantoprazole 20 MG tablet Commonly known as:  PROTONIX Take 20 mg by mouth daily.   Saccharomyces boulardii 250 MG Pack Take 250 mg by mouth 2 (two) times daily for 14 days.   venlafaxine XR 75 MG 24 hr capsule Commonly known as:  EFFEXOR-XR Take 75 mg by mouth daily.      Follow-up Information    Nsumanganyi, Ferdinand Lango, NP. Schedule an appointment as soon as possible for a visit on 08/18/2018.   Specialty:  Adult Health Nurse Practitioner Why:  as already scheduled Contact information: [place] [address] [phone]          Allergies  Allergen Reactions  . Phenergan [Promethazine Hcl]     Causes Restless Legs   Allergies as of 08/15/2018      Reactions   Phenergan [promethazine Hcl]    Causes Restless Legs      Medication List    STOP taking these medications   doxycycline 100 MG capsule Commonly known as:  MONODOX     TAKE these medications   acetaminophen 650 MG CR tablet Commonly known as:   TYLENOL Take 650 mg by mouth every 6 (six) hours as needed for fever.   amoxicillin 875 MG tablet Commonly known as:  AMOXIL Take 1 tablet (875 mg total) by mouth 2 (two) times daily for 6 days.   doxycycline 100 MG capsule Commonly known as:  VIBRAMYCIN Take 1 capsule (100 mg total) by mouth 2 (two) times daily for 6 days.   metroNIDAZOLE 500 MG tablet Commonly known as:  FLAGYL Take 500 mg by mouth 3 (three) times daily.   nicotine 14 mg/24hr patch Commonly known as:  NICODERM CQ - dosed in mg/24 hours Place 14 mg onto the skin daily.   pantoprazole 20 MG tablet Commonly known as:  PROTONIX Take 20 mg by mouth daily.   Saccharomyces boulardii 250 MG Pack Take 250 mg  by mouth 2 (two) times daily for 14 days.   venlafaxine XR 75 MG 24 hr capsule Commonly known as:  EFFEXOR-XR Take 75 mg by mouth daily.       Procedures/Studies: Dg Chest 2 View  Result Date: 08/13/2018 CLINICAL DATA:  Chest pain, history of pneumonia EXAM: CHEST - 2 VIEW COMPARISON:  08/09/2018, 11/30/2017 FINDINGS: Patchy lingular opacity. No pleural effusion. Normal heart size. No pneumothorax IMPRESSION: Patchy lingular opacity may reflect mild pneumonia. This does not appear significantly changed as compared with 08/09/2018 Electronically Signed   By: Donavan Foil M.D.   On: 08/13/2018 23:55   Dg Chest 2 View  Result Date: 08/09/2018 CLINICAL DATA:  Cough and chest tightness for the past 2 weeks. Current smoker. Weight loss of nearly 60 pounds in the past 9 months. EXAM: CHEST - 2 VIEW COMPARISON:  PA and lateral chest x-ray of August 09, 2018 FINDINGS: The lungs are adequately inflated. There is patchy infiltrate in the lingula. There is no pleural effusion. The heart and pulmonary vascularity are normal. The mediastinum is normal in width. The trachea is midline. There is no pleural effusion. The bony thorax exhibits no acute abnormality. IMPRESSION: Patchy pneumonia in the lingula. Followup PA and  lateral chest X-ray is recommended in 3-4 weeks following trial of antibiotic therapy to ensure resolution and exclude underlying malignancy. Thoracic aortic atherosclerosis. Electronically Signed   By: David  Martinique M.D.   On: 08/09/2018 13:22   Ct Angio Chest Pe W Or Wo Contrast  Result Date: 08/14/2018 CLINICAL DATA:  Chest pain and shortness of breath. EXAM: CT ANGIOGRAPHY CHEST WITH CONTRAST TECHNIQUE: Multidetector CT imaging of the chest was performed using the standard protocol during bolus administration of intravenous contrast. Multiplanar CT image reconstructions and MIPs were obtained to evaluate the vascular anatomy. CONTRAST:  18mL ISOVUE-370 IOPAMIDOL (ISOVUE-370) INJECTION 76% COMPARISON:  Radiographs yesterday. FINDINGS: Cardiovascular: There are no filling defects within the pulmonary arteries to suggest pulmonary embolus. Thoracic aorta is normal in caliber without dissection. Heart is normal in size. Small volume pericardial fluid may be physiologic or small effusion. Mediastinum/Nodes: Shotty mediastinal and hilar adenopathy, largest right hilar node measuring 13 mm. Esophagus is nondistended. No visualized thyroid nodule. Lungs/Pleura: Multifocal geographic ground-glass opacities throughout all lobes of both lungs. There is slight peripheral and basilar predominance. Associated linear opacity in the lingula. Mild central bronchial thickening. No septal thickening. No pleural fluid. Upper Abdomen: Simple cyst in the left lobe the liver. Liquid stool in the colon. Musculoskeletal: There are no acute or suspicious osseous abnormalities. Review of the MIP images confirms the above findings. IMPRESSION: 1. No pulmonary embolus. 2. Multifocal geographic ground-glass opacities throughout the lungs, slight peripheral and basilar predominance. Findings are nonspecific and may be infectious or inflammatory. Associated linear opacity in the lingula most consistent with atelectasis. 3. Shotty  mediastinal and right hilar lymph nodes are likely reactive. Electronically Signed   By: Keith Rake M.D.   On: 08/14/2018 02:15      Subjective: Pt says that she feels much better and she wants to home .    Discharge Exam: Vitals:   08/14/18 2117 08/15/18 0524  BP: 107/64 104/69  Pulse: (!) 52 (!) 57  Resp:  18  Temp:  97.6 F (36.4 C)  SpO2: 97% 94%   Vitals:   08/14/18 2105 08/14/18 2116 08/14/18 2117 08/15/18 0524  BP:  93/63 107/64 104/69  Pulse:  (!) 54 (!) 52 (!) 57  Resp:  18  18  Temp:  97.9 F (36.6 C)  97.6 F (36.4 C)  TempSrc:  Oral  Oral  SpO2: 95% 95% 97% 94%  Weight:    69 kg  Height:       General: Pt is alert, awake, not in acute distress Cardiovascular: normal S1/S2 +, no rubs, no gallops Respiratory: no wheezing, no rhonchi Abdominal: Soft, NT, ND, bowel sounds + Extremities: no edema, no cyanosis   The results of significant diagnostics from this hospitalization (including imaging, microbiology, ancillary and laboratory) are listed below for reference.     Microbiology: No results found for this or any previous visit (from the past 240 hour(s)).   Labs: BNP (last 3 results) No results for input(s): BNP in the last 8760 hours. Basic Metabolic Panel: Recent Labs  Lab 08/09/18 1109 08/13/18 2320 08/13/18 2329 08/14/18 0636 08/15/18 0627  NA 135 132* 132* 136 138  K 3.7 3.3* 3.3* 3.6 4.3  CL 102 99 100 103 110  CO2 25 20*  --  22 22  GLUCOSE 109* 108* 105* 125* 116*  BUN 10 11 9 10 15   CREATININE 0.70 0.78 0.70 0.73 0.58  CALCIUM 8.6* 8.5*  --  8.6* 8.6*  MG  --   --   --  2.1 2.1   Liver Function Tests: Recent Labs  Lab 08/09/18 1109 08/13/18 2320 08/15/18 0627  AST 29 16 12*  ALT 32 44 28  ALKPHOS 130* 148* 108  BILITOT 0.7 1.1 0.3  PROT 7.2 7.2 6.0*  ALBUMIN 3.2* 2.9* 2.4*   Recent Labs  Lab 08/09/18 1109  LIPASE 26   No results for input(s): AMMONIA in the last 168 hours. CBC: Recent Labs  Lab 08/09/18 1109  08/13/18 2320 08/13/18 2329 08/15/18 0627  WBC 16.1* 18.7*  --  24.2*  NEUTROABS  --  17.3*  --  22.0*  HGB 12.3 12.5 12.2 10.4*  HCT 37.0 35.8* 36.0 31.0*  MCV 92.0 88.2  --  91.2  PLT 572* 794*  --  795*   Cardiac Enzymes: Recent Labs  Lab 08/13/18 2350  TROPONINI <0.03   BNP: Invalid input(s): POCBNP CBG: No results for input(s): GLUCAP in the last 168 hours. D-Dimer No results for input(s): DDIMER in the last 72 hours. Hgb A1c No results for input(s): HGBA1C in the last 72 hours. Lipid Profile No results for input(s): CHOL, HDL, LDLCALC, TRIG, CHOLHDL, LDLDIRECT in the last 72 hours. Thyroid function studies No results for input(s): TSH, T4TOTAL, T3FREE, THYROIDAB in the last 72 hours.  Invalid input(s): FREET3 Anemia work up No results for input(s): VITAMINB12, FOLATE, FERRITIN, TIBC, IRON, RETICCTPCT in the last 72 hours. Urinalysis    Component Value Date/Time   COLORURINE YELLOW 08/14/2018 0031   APPEARANCEUR CLEAR 08/14/2018 0031   LABSPEC 1.026 08/14/2018 0031   PHURINE 6.0 08/14/2018 0031   GLUCOSEU NEGATIVE 08/14/2018 0031   HGBUR SMALL (A) 08/14/2018 0031   BILIRUBINUR NEGATIVE 08/14/2018 0031   KETONESUR 80 (A) 08/14/2018 0031   PROTEINUR NEGATIVE 08/14/2018 0031   NITRITE NEGATIVE 08/14/2018 0031   LEUKOCYTESUR SMALL (A) 08/14/2018 0031   Sepsis Labs Invalid input(s): PROCALCITONIN,  WBC,  LACTICIDVEN Microbiology No results found for this or any previous visit (from the past 240 hour(s)).  Time coordinating discharge:   SIGNED:  Irwin Brakeman, MD  [facility] 08/15/2018, 9:57 AM Pager [phone]  If 7PM-7AM, please contact night-coverage [email or web address] Password [email or web address]

## 2018-08-15 NOTE — Progress Notes (Signed)
Patient states understanding of discharge instructions.  

## 2018-08-15 NOTE — Discharge Instructions (Signed)
Community-Acquired Pneumonia, Adult Pneumonia is an infection of the lungs. One type of pneumonia can happen while a person is in a hospital. A different type can happen when a person is not in a hospital (community-acquired pneumonia). It is easy for this kind to spread from person to person. It can spread to you if you breathe near an infected person who coughs or sneezes. Some symptoms include:  A dry cough.  A wet (productive) cough.  Fever.  Sweating.  Chest pain.  Follow these instructions at home:  Take over-the-counter and prescription medicines only as told by your doctor. ? Only take cough medicine if you are losing sleep. ? If you were prescribed an antibiotic medicine, take it as told by your doctor. Do not stop taking the antibiotic even if you start to feel better.  Sleep with your head and neck raised (elevated). You can do this by putting a few pillows under your head, or you can sleep in a recliner.  Do not use tobacco products. These include cigarettes, chewing tobacco, and e-cigarettes. If you need help quitting, ask your doctor.  Drink enough water to keep your pee (urine) clear or pale yellow. A shot (vaccine) can help prevent pneumonia. Shots are often suggested for:  People older than 53 years of age.  People older than 53 years of age: ? Who are having cancer treatment. ? Who have long-term (chronic) lung disease. ? Who have problems with their body's defense system (immune system).  You may also prevent pneumonia if you take these actions:  Get the flu (influenza) shot every year.  Go to the dentist as often as told.  Wash your hands often. If soap and water are not available, use hand sanitizer.  Contact a doctor if:  You have a fever.  You lose sleep because your cough medicine does not help. Get help right away if:  You are short of breath and it gets worse.  You have more chest pain.  Your sickness gets worse. This is very serious  if: ? You are an older adult. ? Your body's defense system is weak.  You cough up blood. This information is not intended to replace advice given to you by your health care provider. Make sure you discuss any questions you have with your health care provider. Document Released: 04/14/2008 Document Revised: 04/03/2016 Document Reviewed: 02/21/2015 Elsevier Interactive Patient Education  2018 Manuel Garcia   Follow with Primary MD  Nsumanganyi, Ferdinand Lango, NP  and other consultant's as instructed your Hospitalist MD  Please get a complete blood count and chemistry panel checked by your Primary MD at your next visit, and again as instructed by your Primary MD.  Get Medicines reviewed and adjusted: Please take all your medications with you for your next visit with your Primary MD  Laboratory/radiological data: Please request your Primary MD to go over all hospital tests and procedure/radiological results at the follow up, please ask your Primary MD to get all Hospital records sent to his/her office.  In some cases, they will be blood work, cultures and biopsy results pending at the time of your discharge. Please request that your primary care M.D. follows up on these results.  Also Note the following: If you experience worsening of your admission symptoms, develop shortness of breath, life threatening emergency, suicidal or homicidal thoughts you must seek medical attention immediately by calling 911 or calling your MD immediately  if symptoms less severe.  You must read complete instructions/literature along  with all the possible adverse reactions/side effects for all the Medicines you take and that have been prescribed to you. Take any new Medicines after you have completely understood and accpet all the possible adverse reactions/side effects.   Do not drive when taking Pain medications or sleeping medications (Benzodaizepines)  Do not take more than prescribed Pain, Sleep and Anxiety  Medications. It is not advisable to combine anxiety,sleep and pain medications without talking with your primary care practitioner  Special Instructions: If you have smoked or chewed Tobacco  in the last 2 yrs please stop smoking, stop any regular Alcohol  and or any Recreational drug use.  Wear Seat belts while driving.  Please note: You were cared for by a hospitalist during your hospital stay. Once you are discharged, your primary care physician will handle any further medical issues. Please note that NO REFILLS for any discharge medications will be authorized once you are discharged, as it is imperative that you return to your primary care physician (or establish a relationship with a primary care physician if you do not have one) for your post hospital discharge needs so that they can reassess your need for medications and monitor your lab values.

## 2018-08-18 DIAGNOSIS — R531 Weakness: Secondary | ICD-10-CM | POA: Diagnosis not present

## 2018-08-18 DIAGNOSIS — J189 Pneumonia, unspecified organism: Secondary | ICD-10-CM | POA: Diagnosis not present

## 2018-08-18 DIAGNOSIS — R0600 Dyspnea, unspecified: Secondary | ICD-10-CM | POA: Diagnosis not present

## 2018-08-31 DIAGNOSIS — J189 Pneumonia, unspecified organism: Secondary | ICD-10-CM | POA: Diagnosis not present

## 2018-08-31 DIAGNOSIS — N951 Menopausal and female climacteric states: Secondary | ICD-10-CM | POA: Diagnosis not present

## 2018-09-02 DIAGNOSIS — E860 Dehydration: Secondary | ICD-10-CM | POA: Diagnosis not present

## 2018-09-02 DIAGNOSIS — K580 Irritable bowel syndrome with diarrhea: Secondary | ICD-10-CM | POA: Diagnosis not present

## 2018-09-02 DIAGNOSIS — J189 Pneumonia, unspecified organism: Secondary | ICD-10-CM | POA: Diagnosis not present

## 2018-09-21 ENCOUNTER — Encounter

## 2018-09-21 ENCOUNTER — Encounter: Payer: Self-pay | Admitting: Nurse Practitioner

## 2018-09-21 ENCOUNTER — Ambulatory Visit: Payer: 59 | Admitting: Nurse Practitioner

## 2018-09-21 VITALS — BP 130/74 | HR 70 | Temp 97.8°F | Ht 66.0 in | Wt 147.8 lb

## 2018-09-21 DIAGNOSIS — R195 Other fecal abnormalities: Secondary | ICD-10-CM

## 2018-09-21 DIAGNOSIS — R634 Abnormal weight loss: Secondary | ICD-10-CM

## 2018-09-21 DIAGNOSIS — R1030 Lower abdominal pain, unspecified: Secondary | ICD-10-CM | POA: Diagnosis not present

## 2018-09-21 NOTE — Assessment & Plan Note (Signed)
The patient has had some further weight loss.  However, upon further discussion she has made significant lifestyle changes for healthier diet and not eating late at night while at work.  Occasionally has a dietary indiscretion.  Overall this is likely contributing to her weight loss.  Her BMI is now in the "healthy weight" range.  CT of the abdomen and pelvis has been completed this year.  Colonoscopy up-to-date.  I will have her follow-up in 6 months.  If she is still having significant weight loss we can plan for other work-up including possibly repeat CT of the abdomen/pelvis, possible EGD.  If she notices significant weight loss before her follow-up she can call us and we will have her do a nurse visit/weight check.

## 2018-09-21 NOTE — Assessment & Plan Note (Signed)
She still has intermittent abdominal pain.  She typically only gets this when it is time to have a bowel movement and the pain resolves with a bowel movement.  Recommended changing Bentyl to 4 times a day as needed, as per above.  Overall she feels better than she has all year.  Follow-up in 6 months.  Call if any worsening symptoms before then.

## 2018-09-21 NOTE — Assessment & Plan Note (Addendum)
Overall improved.  Not taking Librax or Bentyl much anymore.  She does sometimes still have morning time diarrhea if she has abdominal pain in the morning.  I feel her symptoms are most consistent with IBS diarrhea type.  At this point I have recommended she change her Bentyl to up to 4 times a day as needed for abdominal pain or diarrhea.  Follow-up in 6 months.  Overall she states she feels better than she has all year.

## 2018-09-21 NOTE — Progress Notes (Signed)
Referring Provider: Redmond School, MD Primary Care Physician:  Yves Dill, NP Primary GI:  Dr. Gala Romney  Chief Complaint  Patient presents with  . Abdominal Pain    "only when I have to have a BM"  . Diarrhea    "comes/goes"    HPI:   Dawn Edwards is a 52 y.o. female who presents for follow-up on diarrhea, abdominal pain, weight loss.  The patient was last seen in our office 06/15/2018 for the same.  At a previous visit she noted lower abdominal pain primarily when having a bowel movement.  Subjective weight loss of 40 pounds since Christmas due to lack of appetite.  Significant stress, sharp abdominal pain relieved with a bowel movement and triggered by the need to have a bowel movement.  Recommended colonoscopy and Bentyl.  At some point it was requested to add an EGD to her colonoscopy although this was deferred due to no upper GI symptoms.  Colonoscopy completed 05/04/2018 which found 2 tubular adenoma polyps and recommended repeat in 5 years (2024).  At her last visit she was doing better, started on Librax.  Occasional "upset stomach" or diarrhea for which Bentyl helps.  Poor appetite, continued weight loss.  Intermittent upper abdominal/epigastric pain but no GERD symptoms.  Feels a previous medication was affecting her appetite as well as significant stress.  Wanting to hold off on EGD because she was feeling better.  No other GI symptoms.  Recommended continued current medications and follow-up in 3 months.  Today she states she's still having issues. Not taking Librax or Bentyl. States it doesn't help. Discussed last OV when she stated she was doing better and she shrugged. When asked when her symptoms improved she states she's not sure. Was in the ER and admitted for pneumonia. Was treated with antibiotics for pneumonia. States her abdominal pain isn't as bad as it was. Still has left-sided/LLQ abdominal pain which starts when she has to have a bowel movement. Has  fecal urgency after that. Has a bowel movement about once daily; if she has a BM when she first gets up it is "very loose" and later in the day will be more solid. Bentyl was previously written as before meals. States she stopped taking it because she isn't sure when she's going to eat. Overall she feels better than she has all year. Denies N/V, fever, chills. States she's still losing weight, 13 lbs, subjectively. Objectively she has lost 11 lbs since her last visit. Thinks the weight loss has helped her symptoms. Has changed her diet to healthier foods, health snacks. Denies hematochezia, melena. Denies chest pain, dyspnea, dizziness, lightheadedness, syncope, near syncope. Denies any other upper or lower GI symptoms.  States her dose of Effexor has been increased which has helped as well.  Past Medical History:  Diagnosis Date  . Depression   . Hypertension   . IBS (irritable bowel syndrome)     Past Surgical History:  Procedure Laterality Date  . APPENDECTOMY    . CESAREAN SECTION    . COLONOSCOPY N/A 05/04/2018   Procedure: COLONOSCOPY;  Surgeon: Daneil Dolin, MD;  [facility];  Service: Endoscopy;  Laterality: N/A;  2:00pm  . PILONIDAL CYST EXCISION    . ROTATOR CUFF REPAIR Right     Current Outpatient Medications  Medication Sig Dispense Refill  . acetaminophen (TYLENOL) 650 MG CR tablet Take 650 mg by mouth every 6 (six) hours as needed for fever.     Marland Kitchen  acidophilus (RISAQUAD) CAPS capsule Take 1 capsule by mouth daily.    Marland Kitchen venlafaxine XR (EFFEXOR-XR) 75 MG 24 hr capsule Take 150 mg by mouth daily.   3  . nicotine (NICODERM CQ - DOSED IN MG/24 HOURS) 14 mg/24hr patch Place 14 mg onto the skin daily.     No current facility-administered medications for this visit.     Allergies as of 09/21/2018 - Review Complete 09/21/2018  Allergen Reaction Noted  . Phenergan [promethazine hcl]  08/14/2018    Family History  Problem Relation Age of Onset  . Gastric cancer  Father   . Colon cancer Neg Hx   . Esophageal cancer Neg Hx     Social History   Socioeconomic History  . Marital status: Single    Spouse name: Not on file  . Number of children: Not on file  . Years of education: Not on file  . Highest education level: Not on file  Occupational History  . Not on file  Social Needs  . Financial resource strain: Not on file  . Food insecurity:    Worry: Not on file    Inability: Not on file  . Transportation needs:    Medical: Not on file    Non-medical: Not on file  Tobacco Use  . Smoking status: Current Every Day Smoker    Packs/day: 1.00    Types: Cigarettes  . Smokeless tobacco: Never Used  Substance and Sexual Activity  . Alcohol use: No  . Drug use: Yes    Types: Marijuana    Comment: occasional, about 3 times a month  . Sexual activity: Not Currently  Lifestyle  . Physical activity:    Days per week: Not on file    Minutes per session: Not on file  . Stress: Not on file  Relationships  . Social connections:    Talks on phone: Not on file    Gets together: Not on file    Attends religious service: Not on file    Active member of club or organization: Not on file    Attends meetings of clubs or organizations: Not on file    Relationship status: Not on file  Other Topics Concern  . Not on file  Social History Narrative  . Not on file    Review of Systems: General: Negative for anorexia, weight loss, fever, chills, fatigue, weakness. ENT: Negative for hoarseness, difficulty swallowing. CV: Negative for chest pain, angina, palpitations, peripheral edema.  Respiratory: Negative for dyspnea at rest, cough, sputum, wheezing.  GI: See history of present illness. Endo: Negative for unusual weight change.  Heme: Negative for bruising or bleeding.   Physical Exam: BP 130/74   Pulse 70   Temp 97.8 F (36.6 C) (Oral)   Ht 5\' 6"  (1.676 m)   Wt 147 lb 12.8 oz (67 kg)   LMP 12/03/2016 Comment: pt says periods stopped last  jan  BMI 23.86 kg/m  General:   Alert and oriented. Pleasant and cooperative. Well-nourished and well-developed.  Eyes:  Without icterus, sclera clear and conjunctiva pink.  Ears:  Normal auditory acuity. Cardiovascular:  S1, S2 present without murmurs appreciated. Extremities without clubbing or edema. Respiratory:  Clear to auscultation bilaterally. No wheezes, rales, or rhonchi. No distress.  Gastrointestinal:  +BS, soft, non-tender and non-distended. No HSM noted. No guarding or rebound. No masses appreciated.  Rectal:  Deferred  Musculoskalatal:  Symmetrical without gross deformities. Neurologic:  Alert and oriented x4;  grossly normal neurologically. Psych:  Alert and cooperative. Normal mood and affect. Heme/Lymph/Immune: No excessive bruising noted.    09/21/2018 9:07 AM   Disclaimer: This note was dictated with voice recognition software. Similar sounding words can inadvertently be transcribed and may not be corrected upon review.

## 2018-09-21 NOTE — Patient Instructions (Signed)
1. As we discussed, you can take Bentyl up to 4 times a day, as needed for abdominal pain and/or diarrhea. 2. We will have you follow-up in 6 months. 3. If you notice persistent, ongoing weight loss then notify our office and we can have you come in for a weight check. 4. Call us if you have any questions or concerns.  At [facility] we value your feedback. You may receive a survey about your visit today. Please share your experience as we strive to create trusting relationships with our patients to provide genuine, compassionate, quality care.  We appreciate your understanding and patience as we review any laboratory studies, imaging, and other diagnostic tests that are ordered as we care for you. Our office policy is 5 business days for review of these results, and any emergent or urgent results are addressed in a timely manner for your best interest. If you do not hear from our office in 1 week, please contact us.   We also encourage the use of MyChart, which contains your medical information for your review as well. If you are not enrolled in this feature, an access code is on this after visit summary for your convenience. Thank you for allowing [place] to be involved in your care.  It was great to see you today!  I hope you have a great Fall!!

## 2018-09-21 NOTE — Progress Notes (Signed)
CC'D TO PCP °

## 2018-10-18 DIAGNOSIS — Z0000 Encounter for general adult medical examination without abnormal findings: Secondary | ICD-10-CM | POA: Diagnosis not present

## 2018-10-21 DIAGNOSIS — Z0000 Encounter for general adult medical examination without abnormal findings: Secondary | ICD-10-CM | POA: Diagnosis not present

## 2018-10-21 DIAGNOSIS — Z23 Encounter for immunization: Secondary | ICD-10-CM | POA: Diagnosis not present

## 2018-10-21 DIAGNOSIS — Z6825 Body mass index (BMI) 25.0-25.9, adult: Secondary | ICD-10-CM | POA: Diagnosis not present

## 2018-10-27 ENCOUNTER — Ambulatory Visit: Payer: 59 | Admitting: Nurse Practitioner

## 2018-11-17 DIAGNOSIS — R350 Frequency of micturition: Secondary | ICD-10-CM | POA: Diagnosis not present

## 2018-11-17 DIAGNOSIS — J069 Acute upper respiratory infection, unspecified: Secondary | ICD-10-CM | POA: Diagnosis not present

## 2018-11-17 DIAGNOSIS — J40 Bronchitis, not specified as acute or chronic: Secondary | ICD-10-CM | POA: Diagnosis not present

## 2018-11-19 DIAGNOSIS — N951 Menopausal and female climacteric states: Secondary | ICD-10-CM | POA: Diagnosis not present

## 2018-11-19 DIAGNOSIS — J40 Bronchitis, not specified as acute or chronic: Secondary | ICD-10-CM | POA: Diagnosis not present

## 2018-12-16 DIAGNOSIS — N951 Menopausal and female climacteric states: Secondary | ICD-10-CM | POA: Diagnosis not present

## 2018-12-23 DIAGNOSIS — M5442 Lumbago with sciatica, left side: Secondary | ICD-10-CM | POA: Diagnosis not present

## 2018-12-23 DIAGNOSIS — Z6824 Body mass index (BMI) 24.0-24.9, adult: Secondary | ICD-10-CM | POA: Diagnosis not present

## 2018-12-27 ENCOUNTER — Ambulatory Visit
Admission: RE | Admit: 2018-12-27 | Discharge: 2018-12-27 | Disposition: A | Discharge status: Home / Self Care | Payer: 59 | Source: Ambulatory Visit | Attending: Family Medicine | Admitting: Family Medicine

## 2018-12-27 ENCOUNTER — Other Ambulatory Visit: Payer: Self-pay | Admitting: Family Medicine

## 2018-12-27 DIAGNOSIS — M47812 Spondylosis without myelopathy or radiculopathy, cervical region: Secondary | ICD-10-CM | POA: Diagnosis not present

## 2018-12-27 DIAGNOSIS — M4802 Spinal stenosis, cervical region: Secondary | ICD-10-CM | POA: Diagnosis not present

## 2018-12-27 DIAGNOSIS — M545 Low back pain, unspecified: Secondary | ICD-10-CM

## 2018-12-27 DIAGNOSIS — M542 Cervicalgia: Secondary | ICD-10-CM

## 2018-12-27 DIAGNOSIS — M48061 Spinal stenosis, lumbar region without neurogenic claudication: Secondary | ICD-10-CM | POA: Diagnosis not present

## 2018-12-27 DIAGNOSIS — Z6825 Body mass index (BMI) 25.0-25.9, adult: Secondary | ICD-10-CM | POA: Diagnosis not present

## 2019-01-10 DIAGNOSIS — N951 Menopausal and female climacteric states: Secondary | ICD-10-CM | POA: Diagnosis not present

## 2019-01-10 DIAGNOSIS — M542 Cervicalgia: Secondary | ICD-10-CM | POA: Diagnosis not present

## 2019-01-13 ENCOUNTER — Ambulatory Visit (HOSPITAL_COMMUNITY): Payer: 59 | Attending: Family Medicine

## 2019-01-13 ENCOUNTER — Encounter (HOSPITAL_COMMUNITY): Payer: Self-pay

## 2019-01-13 ENCOUNTER — Other Ambulatory Visit: Payer: Self-pay

## 2019-01-13 DIAGNOSIS — G8929 Other chronic pain: Secondary | ICD-10-CM | POA: Diagnosis not present

## 2019-01-13 DIAGNOSIS — M5440 Lumbago with sciatica, unspecified side: Secondary | ICD-10-CM | POA: Insufficient documentation

## 2019-01-13 NOTE — Therapy (Signed)
[facility] [place], [place], [address] Phone: [phone]   Fax:  [phone]  Physical Therapy Evaluation  Patient Details  Name: Dawn Edwards MRN: [id number] Date of Birth: Apr 14, 1966 Referring Provider (PT): Fanny Bien, MD   Encounter Date: 01/13/2019  PT End of Session - 01/13/19 0944    Visit Number  1    Number of Visits  2    Date for PT Re-Evaluation  01/27/19    Authorization Type  [employer]    Authorization Time Period  01/13/19 to 01/27/19    Authorization - Visit Number  1    Authorization - Number of Visits  60   PT/OT/ST combined   PT Start Time  0902    PT Stop Time  0932    PT Time Calculation (min)  30 min    Activity Tolerance  Patient limited by pain    Behavior During Therapy  [facility] for tasks assessed/performed       Past Medical History:  Diagnosis Date  . Depression   . Hypertension   . IBS (irritable bowel syndrome)     Past Surgical History:  Procedure Laterality Date  . APPENDECTOMY    . CESAREAN SECTION    . COLONOSCOPY N/A 05/04/2018   Procedure: COLONOSCOPY;  Surgeon: Daneil Dolin, MD;  [facility];  Service: Endoscopy;  Laterality: N/A;  2:00pm  . PILONIDAL CYST EXCISION    . ROTATOR CUFF REPAIR Right     There were no vitals filed for this visit.   Subjective Assessment - 01/13/19 0906    Subjective  Pt states that about 1.5 months ago, she had difficulty holding her urine and her back would hurt real bad when she went to the bathroom. She states that her back feels better as it does not have as much pressure feeling in it but it is still bothering her. This pain started after a really bad fall 5YA; it eventually got better, and would come and go, but as of 1.38months ago, she states that it is more constant now. She states that after sleeping, her L buttock will be numb. She states that she has pain down her LLE to her knee, but never past it. 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 Her neck as been intermittently flaring up over the years. She will have numbness down to 3rd and 4th digits with pain in the palm of her hand. She has the most difficulty with sitting, bending, lifting due to back pain and spasms with her neck when she turns it.     Limitations  Lifting;House hold activities;Sitting    How long can you sit comfortably?  a while in her recliner    How long can you stand comfortably?  sometimes for a long time    How long can you walk comfortably?  sometimes for a long time    Diagnostic tests  has MRI scheduled, awaiting insurance approval    Patient Stated Goals  "get back to my life"    Currently in Pain?  Yes    Pain Score  1     Pain Location  Back    Pain Orientation  Lower;Left    Pain Descriptors / Indicators  Aching;Dull    Pain Type  Chronic pain    Pain Onset  More than a month ago    Pain Frequency  Constant  Aggravating Factors   sitting, bending, lifting    Pain Relieving Factors  comfortable position, sometimes walking, sometimes hot shower    Effect of Pain on Daily Activities  significant increase         [facility] PT Assessment - 01/13/19 0001      Assessment   Medical Diagnosis  chronic neck and back pain    Referring Provider (PT)  Fanny Bien, MD    Onset Date/Surgical Date  --   fall 5YA for back, 1.5 months ago; neck since 1998   Next MD Visit  01/31/19   01/25/19 appt for hormone medication f/u   Prior Therapy  yes for RTC surgery      Balance Screen   Has the patient fallen in the past 6 months  Yes    How many times?  1   slipped on wet stairs   Has the patient had a decrease in activity level because of a fear of falling?   No    Is the patient reluctant to leave their home because of a fear of falling?   No      Prior Function   Level of Independence  Independent    Vocation  On disability    Leisure  kayaking, fishing, work in yard,  Health visitor, Social research officer, government.       Observation/Other Assessments   Focus on Therapeutic Outcomes (FOTO)   n/a - holding therapy until pt has MRI      Sensation   Light Touch  Appears Intact      Deep Tendon Reflexes   DTR Assessment Site  Patella;Achilles    Patella DTR  3+   BLE   Achilles DTR  2+   R was noted to be slightly more than L but both still 2+     ROM / Strength   AROM / PROM / Strength  AROM;Strength      AROM   Overall AROM Comments  all AROM limited by pain, flexion casued dull ache into L buttock    AROM Assessment Site  Lumbar    Lumbar Flexion  28    Lumbar Extension  4    Lumbar - Right Side Bend  7    Lumbar - Left Side Bend  15    Lumbar - Right Rotation  50% limited    Lumbar - Left Rotation  50% limited      Strength   Strength Assessment Site  Hip;Knee;Ankle    Right Hip Flexion  4-/5    Left Hip Flexion  3+/5    Right Knee Extension  3/5    Left Knee Extension  3+/5    Right Ankle Dorsiflexion  5/5    Left Ankle Dorsiflexion  5/5      Special Tests    Special Tests  Lumbar    Lumbar Tests  other;Slump Test      Slump test   Findings  Positive    Side  Right    Comment  pt reported increased LBP with R knee extension; no pain with L knee extension      other   Findings  Positive    Side   Right    Comments  2-3 beat Clonus on RLE; negative on LLE          Objective measurements completed on examination: See above findings.        PT Education - 01/13/19 0944    Education Details  exam  findings, will f/u following MRI next week pending thos results    Person(s) Educated  Patient    Methods  Explanation    Comprehension  Verbalized understanding       PT Short Term Goals - 01/13/19 1359      PT SHORT TERM GOAL #1   Title  no STG or LTG will be established at this time due to pt needing MRI/further neurological examination prior to therapy services beginning. will update as appropriate        PT Long Term Goals -  01/13/19 1400      PT LONG TERM GOAL #1   Title  no STG or LTG will be established at this time due to pt needing MRI/further neurological examination prior to therapy services beginning. will update as appropriate             Plan - 01/13/19 1357    Clinical Impression Statement  Pt is pleasant 53YO F who presents to [facility] with reports of chronic LBP and neck pain. Her back pain started initially after sustaining a fall 5YA and it would wax and wane, however, over the last 1.5 months, pt has had increased pain in her lower back and down into LLE. She also reports incontinence of urine and painful, long BMs. Pt hyper-reflexive with 3+ DTR at L3/knee jerk bilaterally, L5/ankle jerk normal with 2+ DTR bilaterally. Pt + for 2-3beat clonus on RLE, negative on LLE. Pt is displaying signs of both UMN and LMN s/s and PT recommending holding therapy services until she has her MRI for further neurological assessment. Will place pt on hold until MRI results have been issued and will continue therapy services as needed following.     Personal Factors and Comorbidities  Time since onset of injury/illness/exacerbation;Finances;Age;Comorbidity 2    Comorbidities  HTN, depression    Examination-Activity Limitations  Bed Mobility;Bend;Carry;Continence;Lift;Sit;Sleep;Squat;Toileting    Examination-Participation Restrictions  Cleaning;Community Activity;Interpersonal Relationship;Laundry;Personal Finances;Yard Work    Stability/Clinical Decision Making  Unstable/Unpredictable    International aid/development worker    Rehab Potential  Fair    PT Frequency  Other (comment)   pt will f/u with therapy following her MRI pending the results   PT Duration  Other (comment)    PT Treatment/Interventions  ADLs/Self Care Home Management;Aquatic Therapy;Cryotherapy;Electrical Stimulation;Moist Heat;Traction;Ultrasound;DME Instruction;Gait training;Stair training;Functional mobility training;Therapeutic activities;Therapeutic  exercise;Balance training;Neuromuscular re-education;Patient/family education;Manual techniques;Orthotic Fit/Training;Passive range of motion;Scar mobilization;Dry needling;Taping    PT Next Visit Plan  f/u on MRI results and proceed as appropriate    PT Home Exercise Plan  none at this time    Consulted and Agree with Plan of Care  Patient       Patient will benefit from skilled therapeutic intervention in order to improve the following deficits and impairments:  Abnormal gait, Decreased activity tolerance, Decreased balance, Decreased mobility, Decreased range of motion, Decreased strength, Difficulty walking, Hypomobility, Increased fascial restricitons, Impaired flexibility, Increased muscle spasms, Improper body mechanics, Postural dysfunction, Pain  Visit Diagnosis: Chronic bilateral low back pain with sciatica, sciatica laterality unspecified - Plan: PT plan of care cert/re-cert     Problem List Patient Active Problem List   Diagnosis Date Noted  . Atypical pneumonia 08/14/2018  . Nausea vomiting and diarrhea 08/14/2018  . Hyponatremia 08/14/2018  . Hypokalemia 08/14/2018  . Reactive thrombocytosis 08/14/2018  . Anxiety 08/14/2018  . Loss of weight 06/15/2018  . Abdominal pain 03/11/2018  . Loose stools 03/11/2018       Geraldine Solar PT,  DPT   [place], [place], [address] Phone: [phone]   Fax:  [phone]  Name: Dawn Edwards MRN: [id number] Date of Birth: September 09, 1966

## 2019-01-14 ENCOUNTER — Telehealth (HOSPITAL_COMMUNITY): Payer: Self-pay

## 2019-01-14 NOTE — Telephone Encounter (Signed)
This PT called and spoke to Dr. Ival Bible PA-C regarding pt's findings at yesterday's evaluation. PT just informed PA-C of what all the examination revealed and told her that treatment would be held until she got her MRI.    Geraldine Solar PT, DPT

## 2019-01-18 ENCOUNTER — Other Ambulatory Visit: Payer: Self-pay | Admitting: Family Medicine

## 2019-01-18 DIAGNOSIS — M545 Low back pain, unspecified: Secondary | ICD-10-CM

## 2019-01-20 ENCOUNTER — Ambulatory Visit
Admission: RE | Admit: 2019-01-20 | Discharge: 2019-01-20 | Disposition: A | Discharge status: Home / Self Care | Payer: 59 | Source: Ambulatory Visit | Attending: Family Medicine | Admitting: Family Medicine

## 2019-01-20 ENCOUNTER — Other Ambulatory Visit: Payer: Self-pay

## 2019-01-20 DIAGNOSIS — M545 Low back pain, unspecified: Secondary | ICD-10-CM

## 2019-01-24 ENCOUNTER — Ambulatory Visit
Admission: RE | Admit: 2019-01-24 | Discharge: 2019-01-24 | Disposition: A | Discharge status: Home / Self Care | Payer: 59 | Source: Ambulatory Visit | Attending: Family Medicine | Admitting: Family Medicine

## 2019-01-24 ENCOUNTER — Other Ambulatory Visit: Payer: Self-pay

## 2019-01-24 DIAGNOSIS — M545 Low back pain: Secondary | ICD-10-CM | POA: Diagnosis not present

## 2019-01-27 DIAGNOSIS — Z6824 Body mass index (BMI) 24.0-24.9, adult: Secondary | ICD-10-CM | POA: Diagnosis not present

## 2019-01-27 DIAGNOSIS — Z1231 Encounter for screening mammogram for malignant neoplasm of breast: Secondary | ICD-10-CM | POA: Diagnosis not present

## 2019-01-27 DIAGNOSIS — Z01419 Encounter for gynecological examination (general) (routine) without abnormal findings: Secondary | ICD-10-CM | POA: Diagnosis not present

## 2019-01-31 DIAGNOSIS — N951 Menopausal and female climacteric states: Secondary | ICD-10-CM | POA: Diagnosis not present

## 2019-01-31 DIAGNOSIS — M545 Low back pain: Secondary | ICD-10-CM | POA: Diagnosis not present

## 2019-01-31 DIAGNOSIS — G4700 Insomnia, unspecified: Secondary | ICD-10-CM | POA: Diagnosis not present

## 2019-02-04 ENCOUNTER — Telehealth (HOSPITAL_COMMUNITY): Payer: Self-pay

## 2019-02-24 ENCOUNTER — Telehealth (HOSPITAL_COMMUNITY): Payer: Self-pay | Admitting: Adult Health

## 2019-02-24 NOTE — Telephone Encounter (Signed)
02/24/19  Left patient a message to schedule an in office evaluation and she can see Dani Gobble or Jerene Pitch for PT

## 2019-02-28 ENCOUNTER — Telehealth (HOSPITAL_COMMUNITY): Payer: Self-pay | Admitting: Adult Health

## 2019-02-28 NOTE — Telephone Encounter (Signed)
02/28/19  Pt left [place] a message and then I called her back and had to leave another message to just get her scheduled for an in-office eval.

## 2019-03-03 ENCOUNTER — Other Ambulatory Visit: Payer: Self-pay

## 2019-03-03 ENCOUNTER — Ambulatory Visit (HOSPITAL_COMMUNITY): Payer: 59 | Attending: Family Medicine

## 2019-03-03 ENCOUNTER — Encounter (HOSPITAL_COMMUNITY): Payer: Self-pay

## 2019-03-03 DIAGNOSIS — G8929 Other chronic pain: Secondary | ICD-10-CM | POA: Diagnosis not present

## 2019-03-03 DIAGNOSIS — M542 Cervicalgia: Secondary | ICD-10-CM | POA: Insufficient documentation

## 2019-03-03 DIAGNOSIS — R29898 Other symptoms and signs involving the musculoskeletal system: Secondary | ICD-10-CM | POA: Diagnosis present

## 2019-03-03 DIAGNOSIS — M6281 Muscle weakness (generalized): Secondary | ICD-10-CM

## 2019-03-03 DIAGNOSIS — M5440 Lumbago with sciatica, unspecified side: Secondary | ICD-10-CM | POA: Diagnosis not present

## 2019-03-04 DIAGNOSIS — M545 Low back pain: Secondary | ICD-10-CM | POA: Diagnosis not present

## 2019-03-04 NOTE — Therapy (Signed)
[place], [place], [address] Phone: [phone]   Fax:  [phone]  Physical Therapy Evaluation  Patient Details  Name: Dawn Edwards MRN: [id number] Date of Birth: Feb 28, 1966 Referring Provider (PT): Fanny Bien, MD   Encounter Date: 03/03/2019  PT End of Session - 03/04/19 0831    Visit Number  1    Number of Visits  8    Date for PT Re-Evaluation  03/31/19    Authorization Type  [employer]    Authorization Time Period  03/02/29 to 03/31/19    Authorization - Visit Number  2    Authorization - Number of Visits  60   PT/OT/SLP combined   PT Start Time  1034    PT Stop Time  1119    PT Time Calculation (min)  45 min    Activity Tolerance  Patient tolerated treatment well;Patient limited by pain    Behavior During Therapy  [facility] for tasks assessed/performed       Past Medical History:  Diagnosis Date  . Depression   . Hypertension   . IBS (irritable bowel syndrome)     Past Surgical History:  Procedure Laterality Date  . APPENDECTOMY    . CESAREAN SECTION    . COLONOSCOPY N/A 05/04/2018   Procedure: COLONOSCOPY;  Surgeon: Daneil Dolin, MD;  [facility];  Service: Endoscopy;  Laterality: N/A;  2:00pm  . PILONIDAL CYST EXCISION    . ROTATOR CUFF REPAIR Right     There were no vitals filed for this visit.   Subjective Assessment - 03/03/19 1037    Subjective  Pt returns today after ~6 weeks from her last evaluation. 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 She reports thather lumbar pain comes and goes still. She is not sure what causes or relieves it; she cannot pinpoint any specific movement that increased or decreases it but, the more she does, the more she hurts. She reports that she has more issues with numbness/tingling, and sometimes pain down RLE. the pain will stop in her thigh  somewhere but can have numbness in hands and feet. She said that after her f/u with Dr. Ernie Hew, she wanted the pt to ocme to PT and see what therapy says and how it helps her. Also has chronic neck pain but this is not as bad as her back pain. Her BMs are improving as she doesn't have any issues with this anymore.     Limitations  Lifting;House hold activities;Sitting;Walking    How long can you sit comfortably?  a while in her recliner    How long can you stand comfortably?  sometimes for a long time    How long can you walk comfortably?  average 1/2 mile    Diagnostic tests  MRI negative for neural involvement    Patient Stated Goals  "get back to my life"    Currently in Pain?  Yes    Pain Score  1     Pain Location  Back    Pain Orientation  Lower;Left    Pain Descriptors / Indicators  Aching;Dull    Pain Type  Chronic pain    Pain Onset  More than a month ago    Pain Frequency  Constant    Aggravating Factors   sitting, bending, lifting    Pain Relieving  Factors  comfortable position, sometimes walking, sometimes hot shower    Effect of Pain on Daily Activities  significant increase         [facility] PT Assessment - 03/04/19 0001      Assessment   Medical Diagnosis  chronic neck and back pain    Referring Provider (PT)  Fanny Bien, MD    Onset Date/Surgical Date  --   fall 5YA for back, 1.5 months ago; neck since 1998   Next MD Visit  once PT course completed    Prior Therapy  yes for RTC surgery      Prior Function   Level of Independence  Independent    Vocation  On disability    Leisure  kayaking, fishing, work in yard, Health visitor, Social research officer, government.       Observation/Other Assessments   Focus on Therapeutic Outcomes (Katy)   to be completed next visit      Sensation   Light Touch  Appears Intact      Posture/Postural Control   Posture/Postural Control  Postural limitations    Postural Limitations  Increased lumbar lordosis;Anterior pelvic tilt      Deep Tendon  Reflexes   DTR Assessment Site  Patella;Achilles    Patella DTR  3+   BLE   Achilles DTR  2+   BLE     AROM   Lumbar Flexion  58   Gower's sign almost ont he way up + increaed lumbar extensio   Lumbar Extension  2    Lumbar - Right Side Bend  14, tightness    Lumbar - Left Side Bend  13, pain on L    Lumbar - Right Rotation  50-25% limited, no pain    Lumbar - Left Rotation  50% limited, no pain      Strength   Right Hip Flexion  4-/5    Right Hip Extension  2+/5    Right Hip ABduction  4/5    Left Hip Flexion  4-/5    Left Hip Extension  2+/5    Left Hip ABduction  4/5    Right Knee Extension  4+/5    Left Knee Extension  4+/5      Flexibility   Soft Tissue Assessment /Muscle Length  yes    Hamstrings  L tighter than R but both WNL    Quadriceps  tight via gross assessment      Palpation   Spinal mobility  hypomobile and increased tenderness to CPAs lumbar spine; centralization of pain from tailbone back to segment at L1 and L2, no changes in peripheralization at L3    SI assessment   SIJ cluster testing + 5/5 tests, recreating same pain    Palpation comment  increased soft tissue restrictions throughout lumbar paraspinals, recreated same pain, palpable knot on L lumbar paraspinals; increased restrictions in proximal glute max/med (along iliac crest), which recreated same pain      Special Tests    Special Tests  Sacrolliac Tests;Lumbar    Lumbar Tests  Straight Leg Raise;other    Sacroiliac Tests   Sacral Compression      Straight Leg Raise   Findings  Negative    Comment  BLE, >45deg, no referred pain but SLR on LLE recreated LBP      other   Comments  very slight 1-3 beats R>L, but much improved from evaluation on March 2020      Pelvic Dictraction   Findings  Positive  Comment  recreated same pain      Pelvic Compression   Findings  Positive    comment  recreated same pain, bilaterally      Sacral thrust    Findings  Positive    Comments  recreated  same pain      Gaenslen's test   Findings  Positive    Comments  recreated same pain, bilaterally      Sacral Compression   Findings  Positive    Comments  hip thrust, recreated same pain bilaterally      Ambulation/Gait   Ambulation Distance (Feet)  494 Feet   3MWT   Assistive device  None    Gait Pattern  Step-through pattern;Trendelenburg;Antalgic   slight hip add moment to advance RLE>LLE (hip in ER)     Balance   Balance Assessed  Yes      Static Standing Balance   Static Standing - Balance Support  No upper extremity supported    Static Standing Balance -  Activities   Single Leg Stance - Right Leg;Single Leg Stance - Left Leg    Static Standing - Comment/# of Minutes  R: 13.5sec, went into hip IR and R trunk lean; L: 9.3 sec with L trunk lean           Objective measurements completed on examination: See above findings.        PT Education - 03/04/19 0831    Education Details  exam findings, HEP, POC, in-clinic visit risks and benefits    Person(s) Educated  Patient    Methods  Explanation;Demonstration;Handout    Comprehension  Verbalized understanding       PT Short Term Goals - 03/04/19 0905      PT SHORT TERM GOAL #1   Title  Pt will have improved MMT by 1/2 grade in order to reduce pain and improve overall function.     Time  2    Period  Weeks    Status  New    Target Date  03/17/19      PT SHORT TERM GOAL #2   Title  Pt will report being able to sleep during the night awakening 4x or < due to pain, in order to reduce pain and maximize recovery.    Time  2    Period  Weeks    Status  New      PT SHORT TERM GOAL #3   Title  Pt will be able to perform bil SLS for 20 sec without UE and with good steadiness to demo improved functional hip and core strength, to maximize her walking and reduce pain.    Time  2    Period  Weeks    Status  New        PT Long Term Goals - 03/04/19 0905      PT LONG TERM GOAL #1   Title  Pt will have  improved MMT by 1 grade in order to further reduce pain and maximize functional mobility.    Time  4    Period  Weeks    Status  New    Target Date  03/31/19      PT LONG TERM GOAL #2   Title  Pt will report being able sleep during the night awakening 2x or < due to pain, in order to further maximize her recovery and promote return to PLOF.     Time  4    Period  Weeks  Status  New      PT LONG TERM GOAL #3   Title  Pt will report being able to stand and walk for 45 mins or > without increases in LBP to demo improved functional tolerance to mobility and maximize her ability to perform heavy [facility] duties with greater ease.     Time  4    Period  Weeks    Status  New             Plan - 03/04/19 0859    Clinical Impression Statement  Pt returns to OPPT with referral for chronic neck pain and LBP. LBP is much worse than neck so that was the focus of the evaluation. This PT recently saw pt back in March 2020 but referred her for more imaging as her evaluation at that time showed signs of UMN and LMN pathology and in order to ensure pt safety, PT recommended she have her MRI before continuing therapy. Pt had MRI on 01/24/19 and per chart review, MRI showed "Mild lower lumbar degenerative changes, less than often seen at this age. Findings could possibly relate to low back pain, but there is no sign of neural compression." Evaluation this date revealed deficits in lumbar ROM (mostly active extension past resting posture which pt is in lordosis at rest), MMT (especially proximal hip mm), core strength, soft tissue restrictions throughout lumbar spine, hypomobile and tenderness to joint mobs in lumbar spine, as well as deficits in SIJ as she was + for all 5 cluster tests. Pt also demo'ing and expression flexion preference for pain control. Unable to fully assess SIJ due to time constraints but will perform at next visit. Pt needs skilled PT to address these impairments listed in order to reduce pain  and promote return to PLOF. Due to COVID-19, the clinic is currently only open to special case-by-case patients and this PT feels that the pt would greatly benefit from more skilled, hands on manual therapy in order to facilitate improvements; pt was educated on risks and benefits of attending in-clinic sessions and she verbalized understanding and wished to continue as planned.    Personal Factors and Comorbidities  Time since onset of injury/illness/exacerbation;Finances;Age;Comorbidity 2    Comorbidities  HTN, depression    Examination-Activity Limitations  Bed Mobility;Carry;Lift;Sleep;Squat;Stand;Locomotion Level    Examination-Participation Restrictions  Cleaning;Community Activity;Interpersonal Relationship;Laundry;Personal Finances;Yard Work;Shop    Stability/Clinical Decision Making  Evolving/Moderate complexity    Clinical Decision Making  Moderate    Rehab Potential  Fair    PT Frequency  2x / week    PT Duration  4 weeks    PT Treatment/Interventions  ADLs/Self Care Home Management;Aquatic Therapy;Cryotherapy;Electrical Stimulation;Moist Heat;Traction;Ultrasound;DME Instruction;Gait training;Stair training;Functional mobility training;Therapeutic activities;Therapeutic exercise;Balance training;Neuromuscular re-education;Patient/family education;Manual techniques;Orthotic Fit/Training;Passive range of motion;Scar mobilization;Dry needling;Taping;Spinal Manipulations;Joint Manipulations    PT Next Visit Plan  review goals, complete FOTO; further assess SIJ; manual for STM, joint mobs and pain control, general stretching, flexibility, mobility work and BLE, core strength as tolerated    PT Home Exercise Plan  eval: seated lumbar flexion stretch     Consulted and Agree with Plan of Care  Patient       Patient will benefit from skilled therapeutic intervention in order to improve the following deficits and impairments:  Abnormal gait, Decreased activity tolerance, Decreased balance,  Decreased mobility, Decreased range of motion, Decreased strength, Difficulty walking, Hypomobility, Increased fascial restricitons, Impaired flexibility, Increased muscle spasms, Improper body mechanics, Postural dysfunction, Pain  Visit Diagnosis: Chronic bilateral low  back pain with sciatica, sciatica laterality unspecified - Plan: PT plan of care cert/re-cert  Muscle weakness (generalized) - Plan: PT plan of care cert/re-cert  Other symptoms and signs involving the musculoskeletal system - Plan: PT plan of care cert/re-cert  Cervicalgia - Plan: PT plan of care cert/re-cert     Problem List Patient Active Problem List   Diagnosis Date Noted  . Atypical pneumonia 08/14/2018  . Nausea vomiting and diarrhea 08/14/2018  . Hyponatremia 08/14/2018  . Hypokalemia 08/14/2018  . Reactive thrombocytosis 08/14/2018  . Anxiety 08/14/2018  . Loss of weight 06/15/2018  . Abdominal pain 03/11/2018  . Loose stools 03/11/2018      Geraldine Solar PT, DPT  [place] [address] [place], [place], [address] Phone: [phone]   Fax:  [phone]  Name: Dawn Edwards MRN: [id number] Date of Birth: [birth year]

## 2019-03-07 ENCOUNTER — Ambulatory Visit (HOSPITAL_COMMUNITY): Payer: 59

## 2019-03-07 ENCOUNTER — Encounter (HOSPITAL_COMMUNITY): Payer: Self-pay

## 2019-03-07 ENCOUNTER — Other Ambulatory Visit: Payer: Self-pay

## 2019-03-07 DIAGNOSIS — M5440 Lumbago with sciatica, unspecified side: Principal | ICD-10-CM

## 2019-03-07 DIAGNOSIS — M542 Cervicalgia: Secondary | ICD-10-CM

## 2019-03-07 DIAGNOSIS — M6281 Muscle weakness (generalized): Secondary | ICD-10-CM

## 2019-03-07 DIAGNOSIS — R29898 Other symptoms and signs involving the musculoskeletal system: Secondary | ICD-10-CM

## 2019-03-07 DIAGNOSIS — G8929 Other chronic pain: Secondary | ICD-10-CM

## 2019-03-07 NOTE — Therapy (Signed)
[place], [place], [address] Phone: [phone]   Fax:  [phone]  Physical Therapy Treatment  Patient Details  Name: Dawn Edwards MRN: [id number] Date of Birth: [birth year] Referring Provider (PT): Fanny Bien, MD   Encounter Date: 03/07/2019  PT End of Session - 03/07/19 1033    Visit Number  2    Number of Visits  8    Date for PT Re-Evaluation  03/31/19    Authorization Type  [employer]    Authorization Time Period  03/02/29 to 03/31/19    Authorization - Visit Number  3    Authorization - Number of Visits  60   PT/OT/SLP combined   PT Start Time  1034    PT Stop Time  1115    PT Time Calculation (min)  41 min    Activity Tolerance  Patient tolerated treatment well;Patient limited by pain    Behavior During Therapy  [facility] for tasks assessed/performed       Past Medical History:  Diagnosis Date  . Depression   . Hypertension   . IBS (irritable bowel syndrome)     Past Surgical History:  Procedure Laterality Date  . APPENDECTOMY    . CESAREAN SECTION    . COLONOSCOPY N/A 05/04/2018   Procedure: COLONOSCOPY;  Surgeon: Daneil Dolin, MD;  [facility];  Service: Endoscopy;  Laterality: N/A;  2:00pm  . PILONIDAL CYST EXCISION    . ROTATOR CUFF REPAIR Right     There were no vitals filed for this visit.  Subjective Assessment - 03/07/19 1034    Subjective  Pt states that she is feeling rough since her evaluation. Her pain is about 3/10 today in her lower back, nothing down LE or hands. DIi have n/t down R anterior thigh yesterday.     Limitations  Lifting;House hold activities;Sitting;Walking    How long can you sit comfortably?  a while in her recliner    How long can you stand comfortably?  sometimes for a long time    How long can you walk comfortably?  average 1/2 mile    Diagnostic tests  MRI negative for neural involvement    Patient Stated Goals  "get back to my  life"    Currently in Pain?  Yes    Pain Score  3     Pain Location  Back    Pain Orientation  Lower;Mid    Pain Descriptors / Indicators  Aching;Dull    Pain Type  Chronic pain    Pain Onset  More than a month ago    Pain Frequency  Constant    Aggravating Factors   sitting, bending, lifting    Pain Relieving Factors  comfortable position, sometimes walking, sometimes hot shower    Effect of Pain on Daily Activities  significant increase         [facility] PT Assessment - 03/07/19 0001      Observation/Other Assessments   Focus on Therapeutic Outcomes (FOTO)   52% limitation      Special Tests    Special Tests  Leg LengthTest    Other special tests  Gillet/Stork test: more painful on R and more stiff on R side compared to L): + test indicating mobility deficits of SIJ on R    Leg length test   True;Apparent      True   Length  inches    Right  38 in.  Left   38 in.    Comments  from belly button      Apparent   Length  inches    Right  34 in.    Left  34 in.    Comments  from ASIS      Ambulation/Gait   Ambulation/Gait  Yes    Gait Comments  multiple laps in hallway assessing gait further -- noted R shoulder slight lower and R hip slightly higher, indicating QL tightness/spasm           [facility] Adult PT Treatment/Exercise - 03/07/19 0001      Exercises   Exercises  Lumbar      Lumbar Exercises: Stretches   Single Knee to Chest Stretch  Right;Left;2 reps;30 seconds      Lumbar Exercises: Seated   Other Seated Lumbar Exercises  deep breathing with UE ext into table 2x10 reps for 2-3" breaths      Lumbar Exercises: Supine   Other Supine Lumbar Exercises  min cues for log rolling            PT Education - 03/07/19 1033    Education Details  reviewed goals, HEP, SIJ exam findings, exercise technique, continue HEP    Person(s) Educated  Patient    Methods  Demonstration;Explanation    Comprehension  Verbalized understanding;Returned demonstration        PT Short Term Goals - 03/04/19 0905      PT SHORT TERM GOAL #1   Title  Pt will have improved MMT by 1/2 grade in order to reduce pain and improve overall function.     Time  2    Period  Weeks    Status  New    Target Date  03/17/19      PT SHORT TERM GOAL #2   Title  Pt will report being able to sleep during the night awakening 4x or < due to pain, in order to reduce pain and maximize recovery.    Time  2    Period  Weeks    Status  New      PT SHORT TERM GOAL #3   Title  Pt will be able to perform bil SLS for 20 sec without UE and with good steadiness to demo improved functional hip and core strength, to maximize her walking and reduce pain.    Time  2    Period  Weeks    Status  New        PT Long Term Goals - 03/04/19 0905      PT LONG TERM GOAL #1   Title  Pt will have improved MMT by 1 grade in order to further reduce pain and maximize functional mobility.    Time  4    Period  Weeks    Status  New    Target Date  03/31/19      PT LONG TERM GOAL #2   Title  Pt will report being able sleep during the night awakening 2x or < due to pain, in order to further maximize her recovery and promote return to PLOF.     Time  4    Period  Weeks    Status  New      PT LONG TERM GOAL #3   Title  Pt will report being able to stand and walk for 45 mins or > without increases in LBP to demo improved functional tolerance to mobility and maximize her ability to perform heavy  [place] duties with greater ease.     Time  4    Period  Weeks    Status  New            Plan - 03/07/19 1136    Clinical Impression Statement  Began session by reviewing goals and administering FOTO; pt rated herself 52% limited with functional tasks on FOTO. Completed objective testing on SIJ this date. Overall, pt's SIJ appeared symmetrical with the Gillet/Stork test on the R being the most difficult and + test. She had pain when attempting to roll pelvis forward and backward during other assessments,  but PSIS felt symmetrical and ASIS felt and appeared symmetrical. Measured for true and apparent LLD but pt symmetrical in length with both. Gait assessment performed again this date and pt noted to have R shoulder slightly lower than L with R hip slightly higher than L, indicating QL tightness/spasm. Feel pt's SIJ is contributing factor to her LBP but feel the other impairments mentioned in her eval and in this note are also contributing. Rest of session focused on deep breathing with UE ext for core activation and reduced pain, as well as light stretching for lumbar paraspinals. Pt stating that she was having pain with her HEP so PT inquired about it; she stated that did 10-12 sessions for ~10 mins each time of the lumbar flexion stretch given at eval, so approximately 169mins of the stretching per day for the last few days. PT explained that this is way too much as she was only supposed to perform 3-5 stretches for 30-60sec each, 1-2x/day. Reprinted exercise to ensure the reps, sets, etc. were correct and also educated pt that if the seated lumbar flexion stretch was too painful, that she could try [facility]; had pt perform this in clinic today to ensure understanding and she did. Pt needs continued skilled PT intervention to address her impairments noted above in order to reduce overall pain.     Personal Factors and Comorbidities  Time since onset of injury/illness/exacerbation;Finances;Age;Comorbidity 2    Comorbidities  HTN, depression    Examination-Activity Limitations  Bed Mobility;Carry;Lift;Sleep;Squat;Stand;Locomotion Level    Examination-Participation Restrictions  Cleaning;Community Activity;Interpersonal Relationship;Laundry;Personal Finances;Yard Work;Shop    Stability/Clinical Decision Making  Evolving/Moderate complexity    Rehab Potential  Fair    PT Frequency  2x / week    PT Duration  4 weeks    PT Treatment/Interventions  ADLs/Self Care Home Management;Aquatic  Therapy;Cryotherapy;Electrical Stimulation;Moist Heat;Traction;Ultrasound;DME Instruction;Gait training;Stair training;Functional mobility training;Therapeutic activities;Therapeutic exercise;Balance training;Neuromuscular re-education;Patient/family education;Manual techniques;Orthotic Fit/Training;Passive range of motion;Scar mobilization;Dry needling;Taping;Spinal Manipulations;Joint Manipulations    PT Next Visit Plan  continue PNE; begin manual for STM, joint mobs and pain control, general stretching, flexibility, mobility work and BLE, core strength as tolerated, breathing techniques     PT Home Exercise Plan  eval: seated lumbar flexion stretch; 4/27: SKTC, deep breathing with UE ext into table     Consulted and Agree with Plan of Care  Patient       Patient will benefit from skilled therapeutic intervention in order to improve the following deficits and impairments:  Abnormal gait, Decreased activity tolerance, Decreased balance, Decreased mobility, Decreased range of motion, Decreased strength, Difficulty walking, Hypomobility, Increased fascial restricitons, Impaired flexibility, Increased muscle spasms, Improper body mechanics, Postural dysfunction, Pain  Visit Diagnosis: Chronic bilateral low back pain with sciatica, sciatica laterality unspecified  Muscle weakness (generalized)  Other symptoms and signs involving the musculoskeletal system  Cervicalgia     Problem List Patient Active Problem  List   Diagnosis Date Noted  . Atypical pneumonia 08/14/2018  . Nausea vomiting and diarrhea 08/14/2018  . Hyponatremia 08/14/2018  . Hypokalemia 08/14/2018  . Reactive thrombocytosis 08/14/2018  . Anxiety 08/14/2018  . Loss of weight 06/15/2018  . Abdominal pain 03/11/2018  . Loose stools 03/11/2018       Geraldine Solar PT, DPT   [place] [address] [place], [place], [address] Phone: [phone]   Fax:  [phone]  Name:  Dawn Edwards MRN: [id number] Date of Birth: 08/24/1966

## 2019-03-10 ENCOUNTER — Telehealth (HOSPITAL_COMMUNITY): Payer: Self-pay | Admitting: Adult Health

## 2019-03-10 ENCOUNTER — Ambulatory Visit (HOSPITAL_COMMUNITY): Payer: 59

## 2019-03-10 NOTE — Telephone Encounter (Signed)
03/10/19  pt called and said that her cough was worse, she isn't running a fever and didn't think it was allergies.  I checked with Beth and she said she should cancel.  Pt was ok with that response.

## 2019-03-14 ENCOUNTER — Telehealth (HOSPITAL_COMMUNITY): Payer: Self-pay | Admitting: Adult Health

## 2019-03-14 ENCOUNTER — Ambulatory Visit (HOSPITAL_COMMUNITY): Payer: 59

## 2019-03-14 NOTE — Telephone Encounter (Signed)
03/14/19  I called patient to see if she was still coughing and she is... no fever or any symptoms just a cough and she didn't know why... I cx appt and Brooke agreed with

## 2019-03-17 ENCOUNTER — Ambulatory Visit (HOSPITAL_COMMUNITY): Payer: 59

## 2019-03-17 ENCOUNTER — Telehealth (HOSPITAL_COMMUNITY): Payer: Self-pay | Admitting: Adult Health

## 2019-03-17 NOTE — Telephone Encounter (Signed)
03/17/19  I called patient and she is still coughing and she said she is coughing up a little phlegm and it's green.... I checked with Jerene Pitch, PT and she said to tell her to stay home

## 2019-03-21 ENCOUNTER — Ambulatory Visit (HOSPITAL_COMMUNITY): Payer: 59 | Attending: Family Medicine

## 2019-03-21 ENCOUNTER — Encounter (HOSPITAL_COMMUNITY): Payer: Self-pay

## 2019-03-21 ENCOUNTER — Other Ambulatory Visit: Payer: Self-pay

## 2019-03-21 ENCOUNTER — Telehealth (HOSPITAL_COMMUNITY): Payer: Self-pay

## 2019-03-21 DIAGNOSIS — M6281 Muscle weakness (generalized): Secondary | ICD-10-CM | POA: Diagnosis present

## 2019-03-21 DIAGNOSIS — M542 Cervicalgia: Secondary | ICD-10-CM | POA: Diagnosis present

## 2019-03-21 DIAGNOSIS — R29898 Other symptoms and signs involving the musculoskeletal system: Secondary | ICD-10-CM

## 2019-03-21 DIAGNOSIS — G8929 Other chronic pain: Secondary | ICD-10-CM | POA: Insufficient documentation

## 2019-03-21 DIAGNOSIS — M5440 Lumbago with sciatica, unspecified side: Secondary | ICD-10-CM | POA: Insufficient documentation

## 2019-03-21 DIAGNOSIS — M5441 Lumbago with sciatica, right side: Secondary | ICD-10-CM

## 2019-03-21 NOTE — Telephone Encounter (Signed)
Pt called to ask if she could come to her apptment: Beth directed me to ask the Covid-19 questions and if the pt said no to all those question and did not have a fever she could come today for her treatment. Pt states she will be here today at 10:30am.

## 2019-03-21 NOTE — Therapy (Signed)
[place], [place], [address] Phone: [phone]   Fax:  [phone]  Physical Therapy Treatment  Patient Details  Name: Dawn Edwards MRN: [id number] Date of Birth: 01-06-1966 Referring Provider (PT): Fanny Bien, MD   Encounter Date: 03/21/2019  PT End of Session - 03/21/19 1032    Visit Number  3    Number of Visits  8    Date for PT Re-Evaluation  03/31/19    Authorization Type  [employer]    Authorization Time Period  03/02/29 to 03/31/19    Authorization - Visit Number  4    Authorization - Number of Visits  60   PT/OT/SLP combined   PT Start Time  5885    PT Stop Time  1114    PT Time Calculation (min)  42 min    Activity Tolerance  Patient tolerated treatment well;Patient limited by pain    Behavior During Therapy  [facility] for tasks assessed/performed       Past Medical History:  Diagnosis Date  . Depression   . Hypertension   . IBS (irritable bowel syndrome)     Past Surgical History:  Procedure Laterality Date  . APPENDECTOMY    . CESAREAN SECTION    . COLONOSCOPY N/A 05/04/2018   Procedure: COLONOSCOPY;  Surgeon: Daneil Dolin, MD;  [facility];  Service: Endoscopy;  Laterality: N/A;  2:00pm  . PILONIDAL CYST EXCISION    . ROTATOR CUFF REPAIR Right     There were no vitals filed for this visit.  Subjective Assessment - 03/21/19 1032    Subjective  Pt states that her wrists are going numb and aching and she is wearing wrist braces to help her sleep. She has been having a cough but no fever. She states that she has fallen twice; once when outside and she lost her balance and then the other when her dog got under her feet. Just hurt her arms and scratched them.     Limitations  Lifting;House hold activities;Sitting;Walking    How long can you sit comfortably?  a while in her recliner    How long can you stand comfortably?  sometimes for a long time    How long can you  walk comfortably?  average 1/2 mile    Diagnostic tests  MRI negative for neural involvement    Patient Stated Goals  "get back to my life"    Currently in Pain?  Yes    Pain Score  2     Pain Location  Back    Pain Orientation  Lower;Mid    Pain Descriptors / Indicators  Aching;Dull    Pain Type  Chronic pain    Pain Onset  More than a month ago    Pain Frequency  Constant    Aggravating Factors   sitting, bending, lifting    Pain Relieving Factors  comfortable position, sometimes walking, sometimes hot shower    Effect of Pain on Daily Activities  significant increase          [facility] Adult PT Treatment/Exercise - 03/21/19 0001      Lumbar Exercises: Stretches   Hip Flexor Stretch  Right    Hip Flexor Stretch Limitations  heel slides with glute set at end range for hip flexor stretch 10x5" holds    Pelvic Tilt  20 reps    Pelvic Tilt Limitations  A/P, cues for breathing and to not go  thru painful ROM    Quad Stretch  Right;3 reps;30 seconds    Quad Stretch Limitations  prone with rope       Lumbar Exercises: Supine   Clam  10 reps    Clam Limitations  x10" iso holds with belt    Bridge  10 reps    Bridge Limitations  2 sets    Other Supine Lumbar Exercises  ball squeezes 10x10" holds for pain control      Manual Therapy   Manual Therapy  Manual Traction;Soft tissue mobilization    Manual therapy comments  manual completed separate rest of treatment    Soft tissue mobilization  instrument-assisted STM with green weighted ball to lumbar paraspinals, thoracolumbar fasica common insertion, gluteals in prone to reduce restrictions and pain    Manual Traction  long axis distraction of RLE to stretch R QL, improve mobility, and reduce pain             PT Education - 03/21/19 1032    Education Details  exercise technique, continue HEP    Person(s) Educated  Patient    Methods  Explanation;Demonstration    Comprehension  Verbalized understanding;Returned demonstration        PT Short Term Goals - 03/04/19 0905      PT SHORT TERM GOAL #1   Title  Pt will have improved MMT by 1/2 grade in order to reduce pain and improve overall function.     Time  2    Period  Weeks    Status  New    Target Date  03/17/19      PT SHORT TERM GOAL #2   Title  Pt will report being able to sleep during the night awakening 4x or < due to pain, in order to reduce pain and maximize recovery.    Time  2    Period  Weeks    Status  New      PT SHORT TERM GOAL #3   Title  Pt will be able to perform bil SLS for 20 sec without UE and with good steadiness to demo improved functional hip and core strength, to maximize her walking and reduce pain.    Time  2    Period  Weeks    Status  New        PT Long Term Goals - 03/04/19 0905      PT LONG TERM GOAL #1   Title  Pt will have improved MMT by 1 grade in order to further reduce pain and maximize functional mobility.    Time  4    Period  Weeks    Status  New    Target Date  03/31/19      PT LONG TERM GOAL #2   Title  Pt will report being able sleep during the night awakening 2x or < due to pain, in order to further maximize her recovery and promote return to PLOF.     Time  4    Period  Weeks    Status  New      PT LONG TERM GOAL #3   Title  Pt will report being able to stand and walk for 45 mins or > without increases in LBP to demo improved functional tolerance to mobility and maximize her ability to perform heavy [facility] duties with greater ease.     Time  4    Period  Weeks    Status  New  Plan - 03/21/19 1124    Clinical Impression Statement  Began session by assessing Ely's test and potentially femoral nerve entrapment with ankle DF/PF; pt negative for nerve involvement but reported that Ely's test recreated her same LBP. Added prone quad stretch to address this and facilitate reducing LBP. Thomas test +for pain and guarded throughout test but appeared to break // line. Able to add bridging for glute  activation without pain and added iso clams with belt for pain control. Initiated long axis distraction of RLE to stretch tight QL, R hip, and reduce pain. Ended with instrument-assisted STM with green ball to lumbar mm and gluteals to reduce restrictions and pain. Pt reported feeling looser at EOS. Added prone quad stretch to HEP. Continue as planned, progressing as able.     Personal Factors and Comorbidities  Time since onset of injury/illness/exacerbation;Finances;Age;Comorbidity 2    Comorbidities  HTN, depression    Examination-Activity Limitations  Bed Mobility;Carry;Lift;Sleep;Squat;Stand;Locomotion Level    Examination-Participation Restrictions  Cleaning;Community Activity;Interpersonal Relationship;Laundry;Personal Finances;Yard Work;Shop    Stability/Clinical Decision Making  Evolving/Moderate complexity    Rehab Potential  Fair    PT Frequency  2x / week    PT Duration  4 weeks    PT Treatment/Interventions  ADLs/Self Care Home Management;Aquatic Therapy;Cryotherapy;Electrical Stimulation;Moist Heat;Traction;Ultrasound;DME Instruction;Gait training;Stair training;Functional mobility training;Therapeutic activities;Therapeutic exercise;Balance training;Neuromuscular re-education;Patient/family education;Manual techniques;Orthotic Fit/Training;Passive range of motion;Scar mobilization;Dry needling;Taping;Spinal Manipulations;Joint Manipulations    PT Next Visit Plan  continue PNE, manual for STM (trial MFR to R hip iliopsoas), joint mobs and pain control, general stretching, flexibility, mobility work and BLE, core strength as tolerated, breathing techniques     PT Home Exercise Plan  eval: seated lumbar flexion stretch; 4/27: SKTC, deep breathing with UE ext into table; 5/11: prone quad stretch    Consulted and Agree with Plan of Care  Patient       Patient will benefit from skilled therapeutic intervention in order to improve the following deficits and impairments:  Abnormal gait,  Decreased activity tolerance, Decreased balance, Decreased mobility, Decreased range of motion, Decreased strength, Difficulty walking, Hypomobility, Increased fascial restricitons, Impaired flexibility, Increased muscle spasms, Improper body mechanics, Postural dysfunction, Pain  Visit Diagnosis: Chronic bilateral low back pain with sciatica, sciatica laterality unspecified  Muscle weakness (generalized)  Other symptoms and signs involving the musculoskeletal system  Cervicalgia     Problem List Patient Active Problem List   Diagnosis Date Noted  . Atypical pneumonia 08/14/2018  . Nausea vomiting and diarrhea 08/14/2018  . Hyponatremia 08/14/2018  . Hypokalemia 08/14/2018  . Reactive thrombocytosis 08/14/2018  . Anxiety 08/14/2018  . Loss of weight 06/15/2018  . Abdominal pain 03/11/2018  . Loose stools 03/11/2018       Geraldine Solar PT, DPT   [facility] [address] [place], [place], [address] Phone: [phone]   Fax:  [phone]  Name: HYUN REALI MRN: [id number] Date of Birth: 05-Jan-1966

## 2019-03-22 ENCOUNTER — Ambulatory Visit: Payer: 59 | Admitting: Nurse Practitioner

## 2019-03-24 ENCOUNTER — Other Ambulatory Visit: Payer: Self-pay

## 2019-03-24 ENCOUNTER — Ambulatory Visit (HOSPITAL_COMMUNITY): Payer: 59

## 2019-03-24 ENCOUNTER — Encounter (HOSPITAL_COMMUNITY): Payer: Self-pay

## 2019-03-24 DIAGNOSIS — R29898 Other symptoms and signs involving the musculoskeletal system: Secondary | ICD-10-CM

## 2019-03-24 DIAGNOSIS — M542 Cervicalgia: Secondary | ICD-10-CM

## 2019-03-24 DIAGNOSIS — M6281 Muscle weakness (generalized): Secondary | ICD-10-CM

## 2019-03-24 DIAGNOSIS — M5440 Lumbago with sciatica, unspecified side: Secondary | ICD-10-CM | POA: Diagnosis not present

## 2019-03-24 DIAGNOSIS — G8929 Other chronic pain: Secondary | ICD-10-CM

## 2019-03-24 NOTE — Therapy (Signed)
[place], [place], [address] Phone: [phone]   Fax:  [phone]  Physical Therapy Treatment  Patient Details  Name: Dawn Edwards MRN: [id number] Date of Birth: 1966/02/13 Referring Provider (PT): Fanny Bien, MD   Encounter Date: 03/24/2019  PT End of Session - 03/24/19 1032    Visit Number  4    Number of Visits  8    Date for PT Re-Evaluation  03/31/19    Authorization Type  [employer]    Authorization Time Period  03/02/29 to 03/31/19    Authorization - Visit Number  5    Authorization - Number of Visits  60   PT/OT/SLP combined   PT Start Time  1034    PT Stop Time  1114    PT Time Calculation (min)  40 min    Activity Tolerance  Patient tolerated treatment well;Patient limited by pain    Behavior During Therapy  [facility] for tasks assessed/performed       Past Medical History:  Diagnosis Date  . Depression   . Hypertension   . IBS (irritable bowel syndrome)     Past Surgical History:  Procedure Laterality Date  . APPENDECTOMY    . CESAREAN SECTION    . COLONOSCOPY N/A 05/04/2018   Procedure: COLONOSCOPY;  Surgeon: Daneil Dolin, MD;  [facility];  Service: Endoscopy;  Laterality: N/A;  2:00pm  . PILONIDAL CYST EXCISION    . ROTATOR CUFF REPAIR Right     There were no vitals filed for this visit.  Subjective Assessment - 03/24/19 1033    Subjective  Pt states that she was sore following last session; no pain, shooting pains, or numbness following, just soreness. Her wrists are still bothering her.     Limitations  Lifting;House hold activities;Sitting;Walking    How long can you sit comfortably?  a while in her recliner    How long can you stand comfortably?  sometimes for a long time    How long can you walk comfortably?  average 1/2 mile    Diagnostic tests  MRI negative for neural involvement    Patient Stated Goals  "get back to my life"    Currently in Pain?   Yes    Pain Score  1     Pain Location  Back    Pain Orientation  Lower;Mid    Pain Descriptors / Indicators  Aching;Dull    Pain Onset  More than a month ago    Pain Frequency  Constant    Aggravating Factors   sitting, bending, lifting    Pain Relieving Factors  comfortable position, sometimes walking, sometimes hot shower    Effect of Pain on Daily Activities  significant increase         [facility] PT Assessment - 03/24/19 0001      AROM   AROM Assessment Site  Ankle    Right Ankle Dorsiflexion  9    Right Ankle Plantar Flexion  58    Right Ankle Inversion  31    Right Ankle Eversion  9    Left Ankle Dorsiflexion  2    Left Ankle Plantar Flexion  65    Left Ankle Inversion  39    Left Ankle Eversion  9             [facility] Adult PT Treatment/Exercise - 03/24/19 0001      Lumbar Exercises: Stretches  Hip Flexor Stretch  Right;Left    Hip Flexor Stretch Limitations  heel slides with glute set at end range for hip flexor stretch 10x5" holds    Other Lumbar Stretch Exercise  thomas test stretch BLE 2x30"      Lumbar Exercises: Supine   Clam  10 reps    Clam Limitations  x10" iso holds with belt    Bridge  10 reps    Bridge Limitations  2 sets, ankle DF for last few reps of 2nd set to reduce LBP      Manual Therapy   Manual Therapy  Manual Traction;Soft tissue mobilization;Joint mobilization    Manual therapy comments  manual completed separate rest of treatment    Joint Mobilization  L ankle AP talocrural joint mobs to improve DF and reduce LBP    Soft tissue mobilization  instrument-assisted STM with green weighted ball to lumbar paraspinals, thoracolumbar fasica common insertion x 2 mins but increased pt's pain so stopped today    Manual Traction  long axis distraction of RLE to stretch R QL, improve mobility, and reduce pain            PT Education - 03/24/19 1033    Education Details  exercise technique, continue HEP    Person(s) Educated  Patient     Methods  Explanation;Demonstration    Comprehension  Verbalized understanding;Returned demonstration       PT Short Term Goals - 03/04/19 0905      PT SHORT TERM GOAL #1   Title  Pt will have improved MMT by 1/2 grade in order to reduce pain and improve overall function.     Time  2    Period  Weeks    Status  New    Target Date  03/17/19      PT SHORT TERM GOAL #2   Title  Pt will report being able to sleep during the night awakening 4x or < due to pain, in order to reduce pain and maximize recovery.    Time  2    Period  Weeks    Status  New      PT SHORT TERM GOAL #3   Title  Pt will be able to perform bil SLS for 20 sec without UE and with good steadiness to demo improved functional hip and core strength, to maximize her walking and reduce pain.    Time  2    Period  Weeks    Status  New        PT Long Term Goals - 03/04/19 0905      PT LONG TERM GOAL #1   Title  Pt will have improved MMT by 1 grade in order to further reduce pain and maximize functional mobility.    Time  4    Period  Weeks    Status  New    Target Date  03/31/19      PT LONG TERM GOAL #2   Title  Pt will report being able sleep during the night awakening 2x or < due to pain, in order to further maximize her recovery and promote return to PLOF.     Time  4    Period  Weeks    Status  New      PT LONG TERM GOAL #3   Title  Pt will report being able to stand and walk for 45 mins or > without increases in LBP to demo improved functional tolerance to mobility  and maximize her ability to perform heavy [facility] duties with greater ease.     Time  4    Period  Weeks    Status  New            Plan - 03/24/19 1119    Clinical Impression Statement  Pt presenting to therapy reporting improved overall pain following last session. Measured ankle ROM to assess if potentially contributing to LBP; noted reduced DF on L and PF on R with inv/ev relatively symmetrical. Added ankle mobs to improve L ankle DF and  assess LBP response. Continued with hip flexor stretching and core activation exercises, all to reduce LBP. Ended with manual for ankle joint mobs, long-axis distraction, and attempted instrument-assisted STM to paraspinals but pt reporting increased pain. Pt reporting conflicting responses to session this date, stating all exercises increased her LBP, whereas on Monday, these same exercises reduced her pain. She was c/o L sided LBP today, which is new per PT's own recollection and review of previous treatment notes. Unclear why pt responded negatively to session today when only very minor changes were made to POC. Will continue and attempt to progress as able.      Personal Factors and Comorbidities  Time since onset of injury/illness/exacerbation;Finances;Age;Comorbidity 2    Comorbidities  HTN, depression    Examination-Activity Limitations  Bed Mobility;Carry;Lift;Sleep;Squat;Stand;Locomotion Level    Examination-Participation Restrictions  Cleaning;Community Activity;Interpersonal Relationship;Laundry;Personal Finances;Yard Work;Shop    Stability/Clinical Decision Making  Evolving/Moderate complexity    Rehab Potential  Fair    PT Frequency  2x / week    PT Duration  4 weeks    PT Treatment/Interventions  ADLs/Self Care Home Management;Aquatic Therapy;Cryotherapy;Electrical Stimulation;Moist Heat;Traction;Ultrasound;DME Instruction;Gait training;Stair training;Functional mobility training;Therapeutic activities;Therapeutic exercise;Balance training;Neuromuscular re-education;Patient/family education;Manual techniques;Orthotic Fit/Training;Passive range of motion;Scar mobilization;Dry needling;Taping;Spinal Manipulations;Joint Manipulations    PT Next Visit Plan  continue PNE, manual for STM (trial MFR to R hip iliopsoas), joint mobs and pain control, general stretching, flexibility, mobility work and BLE, core strength as tolerated, breathing techniques     PT Home Exercise Plan  eval: seated lumbar  flexion stretch; 4/27: SKTC, deep breathing with UE ext into table; 5/11: prone quad stretch; 5/14: heel slides for hip flexor    Consulted and Agree with Plan of Care  Patient       Patient will benefit from skilled therapeutic intervention in order to improve the following deficits and impairments:  Abnormal gait, Decreased activity tolerance, Decreased balance, Decreased mobility, Decreased range of motion, Decreased strength, Difficulty walking, Hypomobility, Increased fascial restricitons, Impaired flexibility, Increased muscle spasms, Improper body mechanics, Postural dysfunction, Pain  Visit Diagnosis: Chronic bilateral low back pain with sciatica, sciatica laterality unspecified  Muscle weakness (generalized)  Other symptoms and signs involving the musculoskeletal system  Cervicalgia     Problem List Patient Active Problem List   Diagnosis Date Noted  . Atypical pneumonia 08/14/2018  . Nausea vomiting and diarrhea 08/14/2018  . Hyponatremia 08/14/2018  . Hypokalemia 08/14/2018  . Reactive thrombocytosis 08/14/2018  . Anxiety 08/14/2018  . Loss of weight 06/15/2018  . Abdominal pain 03/11/2018  . Loose stools 03/11/2018       Geraldine Solar PT, DPT   [facility] [address] [place], [place], [address] Phone: [phone]   Fax:  [phone]  Name: JAZ LANINGHAM MRN: [id number] Date of Birth: [birth year]

## 2019-03-28 ENCOUNTER — Ambulatory Visit (HOSPITAL_COMMUNITY): Payer: 59

## 2019-03-28 ENCOUNTER — Other Ambulatory Visit: Payer: Self-pay

## 2019-03-28 ENCOUNTER — Encounter (HOSPITAL_COMMUNITY): Payer: Self-pay

## 2019-03-28 DIAGNOSIS — R29898 Other symptoms and signs involving the musculoskeletal system: Secondary | ICD-10-CM

## 2019-03-28 DIAGNOSIS — M5440 Lumbago with sciatica, unspecified side: Secondary | ICD-10-CM | POA: Diagnosis not present

## 2019-03-28 DIAGNOSIS — M542 Cervicalgia: Secondary | ICD-10-CM

## 2019-03-28 DIAGNOSIS — G8929 Other chronic pain: Secondary | ICD-10-CM

## 2019-03-28 DIAGNOSIS — M6281 Muscle weakness (generalized): Secondary | ICD-10-CM

## 2019-03-28 NOTE — Therapy (Signed)
[facility] [address] [place], [place], [address] Phone: [phone]   Fax:  [phone]   PHYSICAL THERAPY DISCHARGE SUMMARY  Visits from Start of Care: 5  Current functional level related to goals / functional outcomes: See below   Remaining deficits: See below   Education / Equipment: See below  Plan: Patient agrees to discharge.  Patient goals were partially met. Patient is being discharged due to lack of progress.  ?????    Physical Therapy Treatment  Patient Details  Name: Dawn Edwards MRN: [id number] Date of Birth: Feb 10, 1966 Referring Provider (PT): Fanny Bien, MD   Encounter Date: 03/28/2019  PT End of Session - 03/28/19 1037    Visit Number  5    Number of Visits  8    Date for PT Re-Evaluation  03/31/19    Authorization Type  [employer]    Authorization Time Period  03/02/29 to 03/31/19    Authorization - Visit Number  5    Authorization - Number of Visits  60   PT/OT/SLP combined   PT Start Time  1037    PT Stop Time  1052    PT Time Calculation (min)  15 min    Activity Tolerance  Patient tolerated treatment well;Patient limited by pain    Behavior During Therapy  [facility] for tasks assessed/performed       Past Medical History:  Diagnosis Date  . Depression   . Hypertension   . IBS (irritable bowel syndrome)     Past Surgical History:  Procedure Laterality Date  . APPENDECTOMY    . CESAREAN SECTION    . COLONOSCOPY N/A 05/04/2018   Procedure: COLONOSCOPY;  Surgeon: Daneil Dolin, MD;  Location: [facility];  Service: Endoscopy;  Laterality: N/A;  2:00pm  . PILONIDAL CYST EXCISION    . ROTATOR CUFF REPAIR Right     There were no vitals filed for this visit.  Subjective Assessment - 03/28/19 1037    Subjective  Pt states that she is having about 4/10 pain in her lower back and neck and states that her hands woke her up twice last night. She states that she hasn't been able to do  anything, but states she has been able to do her HEP.     Limitations  Lifting;House hold activities;Sitting;Walking    How long can you sit comfortably?  a while in her recliner    How long can you stand comfortably?  sometimes for a long time    How long can you walk comfortably?  average 1/2 mile    Diagnostic tests  MRI negative for neural involvement    Patient Stated Goals  "get back to my life"    Currently in Pain?  Yes    Pain Score  4     Pain Location  Back    Pain Orientation  Lower    Pain Descriptors / Indicators  Aching;Dull    Pain Type  Chronic pain    Pain Onset  More than a month ago    Pain Frequency  Constant    Aggravating Factors   sitting, bending, lifting    Pain Relieving Factors  comfortable position, sometime walking, sometimes hot shower    Effect of Pain on Daily Activities  significant increase         [facility] PT Assessment - 03/28/19 0001      Assessment   Medical Diagnosis  chronic neck and back  pain    Referring Provider (PT)  Fanny Bien, MD    Onset Date/Surgical Date  --   fall 5YA for back; 1.5MA, neck since 1998   Next MD Visit  04/01/19    Prior Therapy  yes for RTC surgery               Strength   Right Hip Flexion  4-/5   was 4-   Right Hip Extension  3-/5   was 2+   Right Hip ABduction  4/5   was 4   Left Hip Flexion  4/5   was 4-   Left Hip Extension  3-/5   was 2+   Left Hip ABduction  4/5   was 4   Right Knee Extension  5/5   was 4+   Left Knee Extension  5/5   was 4+     Balance   Balance Assessed  Yes      Static Standing Balance   Static Standing - Balance Support  No upper extremity supported    Static Standing Balance -  Activities   Single Leg Stance - Right Leg;Single Leg Stance - Left Leg    Static Standing - Comment/# of Minutes  R: 27sec L: 17sec, mod LOB    R was 13.5sec, L was 9.3sec          PT Education - 03/28/19 1040    Person(s) Educated  Patient    Methods  Explanation;Demonstration     Comprehension  Verbalized understanding;Returned demonstration          PT Short Term Goals - 03/28/19 1041      PT SHORT TERM GOAL #1   Title  Pt will have improved MMT by 1/2 grade in order to reduce pain and improve overall function.     Baseline  5/18: see MMT    Time  2    Period  Weeks    Status  Partially Met    Target Date  03/17/19      PT SHORT TERM GOAL #2   Title  Pt will report being able to sleep during the night awakening 4x or < due to pain, in order to reduce pain and maximize recovery.    Baseline  5/18: pt still not able to sleep thru the night due to pain    Time  2    Period  Weeks    Status  On-going      PT SHORT TERM GOAL #3   Title  Pt will be able to perform bil SLS for 20 sec without UE and with good steadiness to demo improved functional hip and core strength, to maximize her walking and reduce pain.    Baseline  5/18: R: 27sec, L: 17sec    Time  2    Period  Weeks    Status  Partially Met        PT Long Term Goals - 03/28/19 1042      PT LONG TERM GOAL #1   Title  Pt will have improved MMT by 1 grade in order to further reduce pain and maximize functional mobility.    Baseline  5/18: see MMT    Time  4    Period  Weeks    Status  On-going      PT LONG TERM GOAL #2   Title  Pt will report being able sleep during the night awakening 2x or < due to pain, in order  to further maximize her recovery and promote return to PLOF.     Baseline  5/18: pt still not able to sleep thru the night due to pain    Time  4    Period  Weeks    Status  On-going      PT LONG TERM GOAL #3   Title  Pt will report being able to stand and walk for 45 mins or > without increases in LBP to demo improved functional tolerance to mobility and maximize her ability to perform heavy [facility] duties with greater ease.     Baseline  5/18: "differs;" averages 45 mins    Time  4    Period  Weeks    Status  Achieved            Plan - 03/28/19 1104    Clinical  Impression Statement  Pt presented to therapy with continued reports of increased pain and states it has been increased since therapy last Monday, even though pt verbalized feeling better at the end of that session and at the beginning of the following one on Thursday. PT discussed early reassessment of goals and pt in agreement with this. Pt has made slight improvements in MMT and balance as illustrated above, however, her overall function, sleep, pain, and ability to perform [facility] and community tasks are still significantly impaired. Pt reporting conflicting responses to exercises and stating that what helps one day may not help the next day and can increase her pain. Since pt has had increased disability since starting PT, feel it is most appropriate to discharge pt from therapy services at this time and have her f/u with her referring physician regarding management going forward, with potential referral to specialist. Pt verbalized understanding of this. D/c to HEP at this time.     Personal Factors and Comorbidities  Time since onset of injury/illness/exacerbation;Finances;Age;Comorbidity 2    Comorbidities  HTN, depression    Examination-Activity Limitations  Bed Mobility;Carry;Lift;Sleep;Squat;Stand;Locomotion Level    Examination-Participation Restrictions  Cleaning;Community Activity;Interpersonal Relationship;Laundry;Personal Finances;Yard Work;Shop    Stability/Clinical Decision Making  Evolving/Moderate complexity    Rehab Potential  Fair    PT Frequency  2x / week    PT Duration  4 weeks    PT Treatment/Interventions  ADLs/Self Care Home Management;Aquatic Therapy;Cryotherapy;Electrical Stimulation;Moist Heat;Traction;Ultrasound;DME Instruction;Gait training;Stair training;Functional mobility training;Therapeutic activities;Therapeutic exercise;Balance training;Neuromuscular re-education;Patient/family education;Manual techniques;Orthotic Fit/Training;Passive range of motion;Scar mobilization;Dry  needling;Taping;Spinal Manipulations;Joint Manipulations    PT Next Visit Plan  d/c to HEP and to f/u with MD regarding lack of progress    PT Home Exercise Plan  eval: seated lumbar flexion stretch; 4/27: SKTC, deep breathing with UE ext into table; 5/11: prone quad stretch; 5/14: heel slides for hip flexor    Consulted and Agree with Plan of Care  Patient       Patient will benefit from skilled therapeutic intervention in order to improve the following deficits and impairments:  Abnormal gait, Decreased activity tolerance, Decreased balance, Decreased mobility, Decreased range of motion, Decreased strength, Difficulty walking, Hypomobility, Increased fascial restricitons, Impaired flexibility, Increased muscle spasms, Improper body mechanics, Postural dysfunction, Pain  Visit Diagnosis: Chronic bilateral low back pain with sciatica, sciatica laterality unspecified  Muscle weakness (generalized)  Other symptoms and signs involving the musculoskeletal system  Cervicalgia     Problem List Patient Active Problem List   Diagnosis Date Noted  . Atypical pneumonia 08/14/2018  . Nausea vomiting and diarrhea 08/14/2018  . Hyponatremia 08/14/2018  . Hypokalemia 08/14/2018  .  Reactive thrombocytosis 08/14/2018  . Anxiety 08/14/2018  . Loss of weight 06/15/2018  . Abdominal pain 03/11/2018  . Loose stools 03/11/2018       Geraldine Solar PT, DPT    [facility] [address] [place], [place], [address] Phone: [phone]   Fax:  [phone]  Name: BERDA SHELVIN MRN: [id number] Date of Birth: 1966/02/20

## 2019-03-31 ENCOUNTER — Ambulatory Visit (HOSPITAL_COMMUNITY): Payer: 59

## 2019-04-01 DIAGNOSIS — M19049 Primary osteoarthritis, unspecified hand: Secondary | ICD-10-CM | POA: Diagnosis not present

## 2019-04-05 ENCOUNTER — Ambulatory Visit (HOSPITAL_COMMUNITY): Payer: 59

## 2019-04-10 IMAGING — CR DG CERVICAL SPINE COMPLETE 4+V
6 series · 6 of 6 positions shown · non-contrast
Comparison: Cervical MRI January 07, 2005

CLINICAL DATA: Chronic cervicalgia

EXAM:
CERVICAL SPINE - COMPLETE 4+ VIEW

[w c-spine lat (1 of 2)]
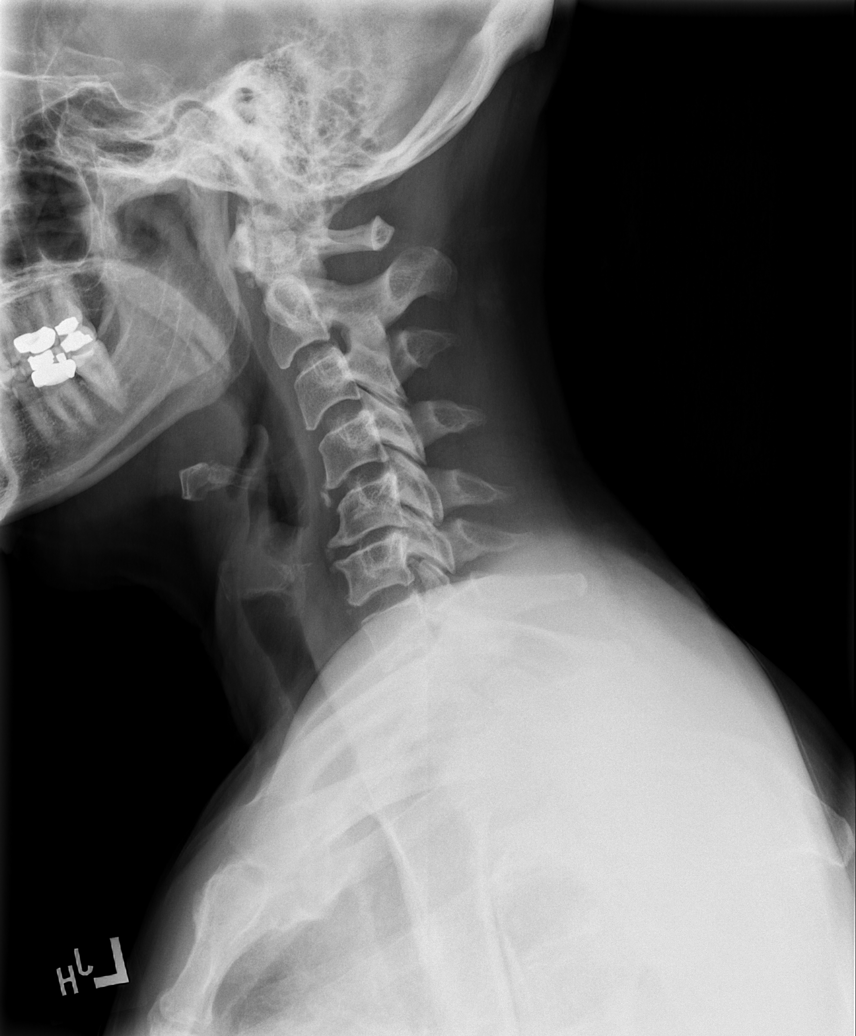

[w c-spine lat (2 of 2)]
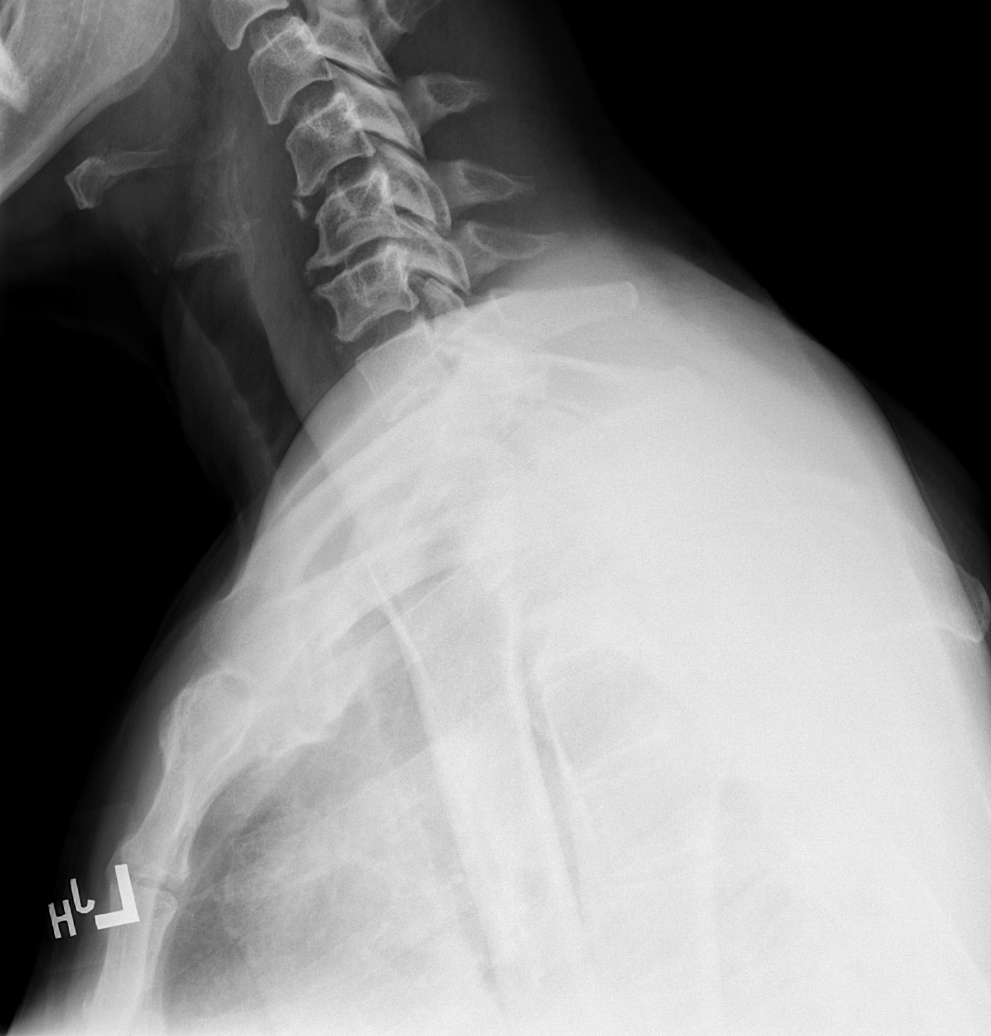

[w c-spine oblique (1 of 2)]
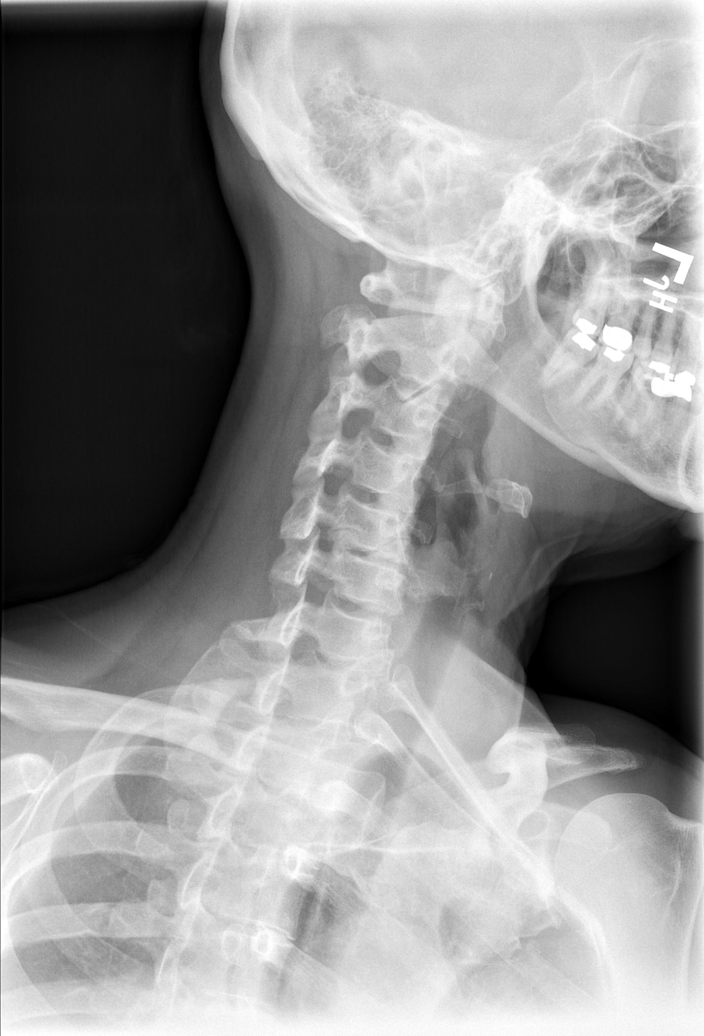

[w c-spine oblique (2 of 2)]
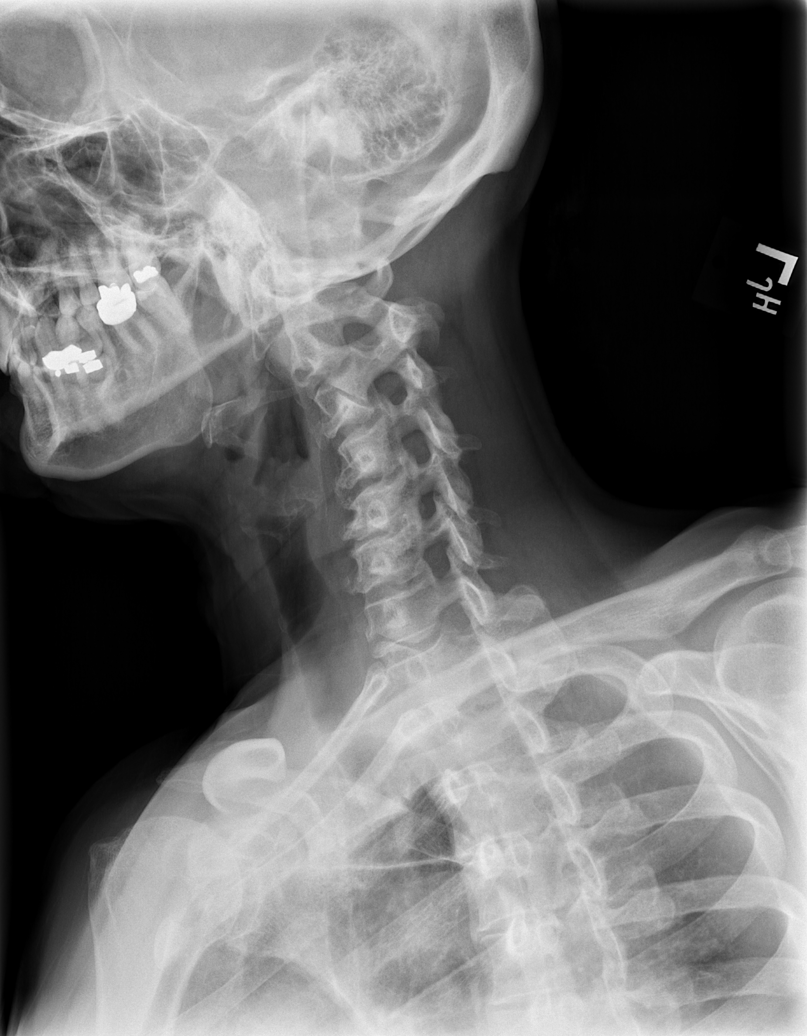

[w c-spine a.p. *]
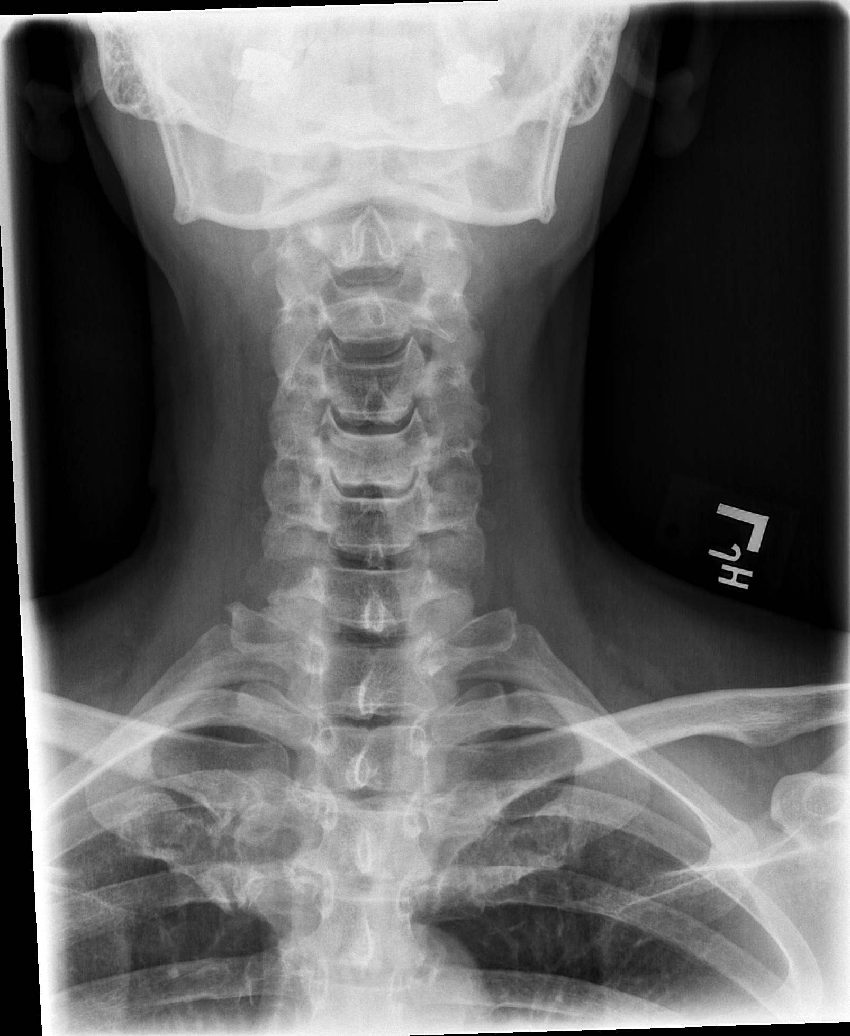

[w c-spine odontoid *]
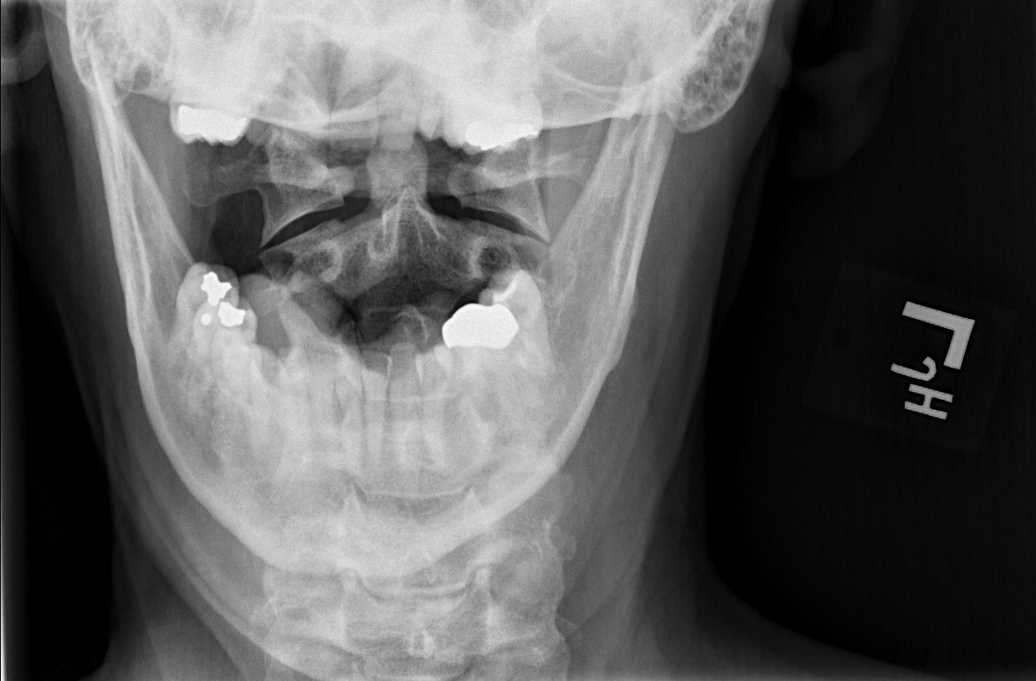

[6 of 6 positions shown; findings below may reference images not displayed]

FINDINGS: Frontal, lateral, open-mouth odontoid, and bilateral oblique views
were obtained. There is no fracture or spondylolisthesis.
Prevertebral soft tissues and predental space regions are normal.
There is moderate disc space narrowing at C5-6. Other disc spaces
appear unremarkable. There are prominent anterior osteophytes at C4,
C5, and C6. Foci of calcification in the anterior longitudinal
ligament noted at C4-5, C5-6, and to a lesser degree at C6-7. There
is facet hypertrophy with exit foraminal narrowing at C4-5 and C5-6
bilaterally.

There is reversal of lordotic curvature.  Lung apices are clear.
IMPRESSION: Osteoarthritic change, most notably at C5-6. No fracture or
spondylolisthesis. Reversal of lordotic curvature is most likely due
to chronic muscle spasm.

## 2019-04-11 ENCOUNTER — Encounter (HOSPITAL_COMMUNITY): Payer: 59

## 2019-04-26 ENCOUNTER — Ambulatory Visit
Admission: RE | Admit: 2019-04-26 | Discharge: 2019-04-26 | Disposition: A | Discharge status: Home / Self Care | Payer: 59 | Source: Ambulatory Visit | Attending: Family Medicine | Admitting: Family Medicine

## 2019-04-26 ENCOUNTER — Other Ambulatory Visit: Payer: Self-pay | Admitting: Family Medicine

## 2019-04-26 ENCOUNTER — Other Ambulatory Visit: Payer: Self-pay

## 2019-04-26 DIAGNOSIS — M542 Cervicalgia: Secondary | ICD-10-CM

## 2019-09-02 ENCOUNTER — Other Ambulatory Visit: Payer: Self-pay

## 2019-09-02 ENCOUNTER — Encounter (HOSPITAL_COMMUNITY): Payer: Self-pay | Admitting: *Deleted

## 2019-09-02 DIAGNOSIS — Z5321 Procedure and treatment not carried out due to patient leaving prior to being seen by health care provider: Secondary | ICD-10-CM | POA: Insufficient documentation

## 2019-09-02 DIAGNOSIS — R05 Cough: Secondary | ICD-10-CM | POA: Insufficient documentation

## 2019-09-02 DIAGNOSIS — R0789 Other chest pain: Secondary | ICD-10-CM | POA: Diagnosis present

## 2019-09-02 NOTE — ED Triage Notes (Signed)
Pt with left lung pain for 4 weeks, denies fever. Pt with recent surgery on bilateral hands for carpal tunnel. Cough noted in triage with occ. White phlegm.

## 2019-09-03 ENCOUNTER — Emergency Department (HOSPITAL_COMMUNITY)
Admission: EM | Admit: 2019-09-03 | Discharge: 2019-09-03 | Disposition: A | Discharge status: Home / Self Care | Payer: 59 | Attending: Emergency Medicine | Admitting: Emergency Medicine

## 2019-09-03 ENCOUNTER — Emergency Department (HOSPITAL_COMMUNITY): Payer: 59

## 2019-09-08 ENCOUNTER — Other Ambulatory Visit: Payer: Self-pay

## 2019-09-08 DIAGNOSIS — Z20822 Contact with and (suspected) exposure to covid-19: Secondary | ICD-10-CM

## 2019-09-10 LAB — NOVEL CORONAVIRUS, NAA: SARS-CoV-2, NAA: NOT DETECTED

## 2020-01-25 ENCOUNTER — Other Ambulatory Visit: Payer: Self-pay

## 2020-01-25 ENCOUNTER — Encounter: Payer: Self-pay | Admitting: Dermatology

## 2020-01-25 ENCOUNTER — Ambulatory Visit: Payer: 59 | Admitting: Dermatology

## 2020-01-25 ENCOUNTER — Ambulatory Visit (HOSPITAL_COMMUNITY): Payer: 59

## 2020-01-25 DIAGNOSIS — L570 Actinic keratosis: Secondary | ICD-10-CM | POA: Diagnosis not present

## 2020-01-25 DIAGNOSIS — C4492 Squamous cell carcinoma of skin, unspecified: Secondary | ICD-10-CM

## 2020-01-25 DIAGNOSIS — D492 Neoplasm of unspecified behavior of bone, soft tissue, and skin: Secondary | ICD-10-CM

## 2020-01-25 DIAGNOSIS — D485 Neoplasm of uncertain behavior of skin: Secondary | ICD-10-CM

## 2020-01-25 HISTORY — DX: Squamous cell carcinoma of skin, unspecified: C44.92

## 2020-01-25 MED ORDER — MUPIROCIN 2 % EX OINT: 1.0000 "application " | TOPICAL_OINTMENT | Freq: Every day | CUTANEOUS | 0 refills | Status: AC

## 2020-01-25 NOTE — Progress Notes (Signed)
   New Patient   Subjective  Dawn Edwards is a 54 y.o. female who presents for the following: Skin Problem (spot on right upper cheekbone x 5-6 weeks. Pt denies bleeing, it's very painful ). growth Location: right temple Duration: one month Quality: incr size Associated Signs/Symptoms: pain Modifying Factors:  Severity:  Timing: Context:   The following portions of the chart were reviewed this encounter and updated as appropriate:     Objective  Well appearing patient in no apparent distress; mood and affect are within normal limits. Patient reports rapid growth of a painful crust on her right temple.  Examination showed a volcano shaped 1 cm nodule which best fits a K a-SCCA.  Diagnosis diagnosis discussed in detail.  Shave biopsy done.  Because of upcoming left shoulder surgery I will have her apply mupirocin ointment twice daily.  She will check my chart in 2 days and discussed the results with me.  We will schedule surgery for 6 weeks but Dawn Edwards knows if it grows back is painful try and see her sooner.  Additionally there is an actinic keratosis of the right upper lip which was treated with liquid nitrogen freeze. A focused examination was performed including head and neck. Relevant physical exam findings are noted in the Assessment and Plan.  Objective  Left Melolabial Fold, Philtrum: Erythematous patches with gritty scale.  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       Assessment & Plan  AK (actinic keratosis) (2) Left Melolabial Fold; Philtrum  Destruction of lesion - Left Melolabial Fold, Philtrum  Outcome: patient tolerated procedure well with no complications    Neoplasm of skin Right Upper Zygomatic Area  Skin / nail biopsy  mupirocin ointment (BACTROBAN) 2 %  Specimen 1 - Surgical pathology Differential Diagnosis: R/O SCC/KA Check Margins: No

## 2020-01-25 NOTE — Patient Instructions (Signed)
Patient reports rapid growth of a painful crust on her right temple.  Examination showed a volcano shaped 1 cm nodule which best fits a K a-SCCA.  Diagnosis diagnosis discussed in detail.  Shave biopsy done.  Because of upcoming left shoulder surgery I will have her apply mupirocin ointment twice daily.  She will check my chart in 2 days and discussed the results with me.  We will schedule surgery for 6 weeks but Marny knows if it grows back is painful try and see her sooner.  Additionally there is an actinic keratosis of the right upper lip which was treated with liquid nitrogen freeze.

## 2020-01-27 ENCOUNTER — Telehealth: Payer: Self-pay | Admitting: Dermatology

## 2020-01-27 ENCOUNTER — Telehealth: Payer: Self-pay

## 2020-01-27 NOTE — Addendum Note (Signed)
Addended by: Lavonna Monarch on: 01/27/2020 10:35 AM   Modules accepted: Level of Service

## 2020-01-27 NOTE — Telephone Encounter (Signed)
Patient is calling to get pathology results from last visit with ST.  Patient was a new patient, so does not have a chart.

## 2020-01-27 NOTE — Telephone Encounter (Signed)
-----   Message from Lavonna Monarch, MD sent at 01/27/2020  6:00 AM EDT ----- Schedule surgery with Dr. Darene Lamer

## 2020-01-27 NOTE — Progress Notes (Addendum)
   Follow-Up Visit   Subjective  Dawn Edwards is a 54 y.o. female who presents for the following: Skin Problem (spot on right upper cheekbone x 5-6 weeks. Pt denies bleeing, it's very painful ).  New growth Location: Right temple Duration: 2 months Quality: Growing Associated Signs/Symptoms: Painful Modifying Factors:  Severity:  Timing: Context:   The following portions of the chart were reviewed this encounter and updated as appropriate:     Objective  Well appearing patient in no apparent distress; mood and affect are within normal limits.  All sun exposed areas plus back examined. 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  Shave biopsy performed; obvious deep keratin noticed pretreatment referred.  Center crust lower philtrum compatible with actinic keratosis; 5-second LN2 freeze. Objective  Left Melolabial Fold, Philtrum: Erythematous patches with gritty scale.  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       Assessment & Plan  AK (actinic keratosis) (2) Left Melolabial Fold; Philtrum  Destruction of lesion - Left Melolabial Fold, Philtrum  Destruction method: cryotherapy   Informed consent: discussed and consent obtained   Lesion destroyed using liquid nitrogen: Yes   Region frozen until ice ball extended beyond lesion: Yes   Outcome: patient tolerated procedure well with no complications    Neoplasm of skin Right Upper Zygomatic Area  Skin / nail biopsy Type of biopsy: tangential   Informed consent: discussed and consent obtained   Timeout: patient name, date of birth, surgical site, and procedure verified   Procedure prep:  Patient was prepped and draped in usual sterile fashion Prep type:  Chlorhexidine Anesthesia: the lesion was anesthetized in a standard fashion   Anesthetic:  1% lidocaine w/ epinephrine 1-100,000 local infiltration Instrument used: flexible razor  blade   Hemostasis achieved with: ferric subsulfate   Outcome: patient tolerated procedure well   Post-procedure details: sterile dressing applied and wound care instructions given   Dressing type: petrolatum   Additional details:  Patient identified lesion of concern.  Lesion identified by physician.  mupirocin ointment (BACTROBAN) 2 %  Specimen 1 - Surgical pathology Differential Diagnosis: R/O SCC/KA Check Margins: No

## 2020-01-27 NOTE — Telephone Encounter (Signed)
Phone call to pt with Pathology results and Dr. Onalee Hua recommendations.  Patient aware of Path results and appointment scheduled with Dr. Denna Haggard on 04/05/2020 @ 7:30am

## 2020-02-01 ENCOUNTER — Encounter (HOSPITAL_COMMUNITY): Payer: 59 | Admitting: Occupational Therapy

## 2020-02-02 ENCOUNTER — Other Ambulatory Visit: Payer: Self-pay

## 2020-02-02 ENCOUNTER — Encounter (HOSPITAL_COMMUNITY): Payer: Self-pay

## 2020-02-02 ENCOUNTER — Ambulatory Visit (HOSPITAL_COMMUNITY): Payer: 59 | Attending: Orthopedic Surgery

## 2020-02-02 DIAGNOSIS — R29898 Other symptoms and signs involving the musculoskeletal system: Secondary | ICD-10-CM | POA: Diagnosis not present

## 2020-02-02 DIAGNOSIS — M25612 Stiffness of left shoulder, not elsewhere classified: Secondary | ICD-10-CM | POA: Diagnosis present

## 2020-02-02 DIAGNOSIS — M25622 Stiffness of left elbow, not elsewhere classified: Secondary | ICD-10-CM | POA: Diagnosis present

## 2020-02-02 DIAGNOSIS — M25512 Pain in left shoulder: Secondary | ICD-10-CM | POA: Diagnosis present

## 2020-02-02 NOTE — Patient Instructions (Signed)
     COMPLETE PENDULUM EXERCISES FOR 30 SECONDS TO A MINUTE EACH, 3-5 TIMES PER DAY. ROM: Pendulum (Side-to-Side)    http://orth.exer.us/792   Copyright  VHI. All rights reserved.  Pendulum Forward/Back   Bend forward 90 at waist, using table for support. Rock body forward and back to swing arm. Repeat ____ times. Do ____ sessions per day.   Copyright  VHI. All rights reserved.  AROM: Wrist Extension   With right palm down, bend wrist up. Repeat 10____ times per set. Do ____ sets per session. Do __3__ sessions per day.  Copyright  VHI. All rights reserved.   AROM: Wrist Flexion   With right palm up, bend wrist up. Repeat ___10_ times per set. Do ____ sets per session. Do __3__ sessions per day.  Copyright  VHI. All rights reserved.   AROM: Forearm Pronation / Supination   With right arm in handshake position, slowly rotate palm down until stretch is felt. Relax. Then rotate palm up until stretch is felt. Repeat __10__ times per set. Do ____ sets per session. Do __3__ sessions per day.  Copyright  VHI. All rights reserved.   AFlexion (Passive)   Use other hand to bend elbow, with thumb toward same shoulder. Do NOT force this motion. Repeat _10___ times. Do ____ sessions per day.

## 2020-02-02 NOTE — Therapy (Signed)
[facility] [address] [place], [place], [address] Phone: [phone]   Fax:  [phone]  Occupational Therapy Evaluation  Patient Details  Name: Dawn Edwards MRN: [id number] Date of Birth: 23-Jan-1966 Referring Provider (OT): Esmond Plants, MD   Encounter Date: 02/02/2020  OT End of Session - 02/02/20 1843    Visit Number  1    Number of Visits  20    Date for OT Re-Evaluation  04/26/20   mini reassess:03/01/20   Authorization Type  [employer]    Authorization Time Period  No authorization needed. 90 combined visits PT/OT/SLP. 7 used.    Authorization - Visit Number  1    Authorization - Number of Visits  70    OT Start Time  [id number]    OT Stop Time  1550    OT Time Calculation (min)  35 min    Activity Tolerance  Patient limited by pain    Behavior During Therapy  [facility] for tasks assessed/performed;Flat affect       Past Medical History:  Diagnosis Date  . Anxiety   . Arthritis    patient reported  . Depression   . Hypertension   . IBS (irritable bowel syndrome)   . 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 APPENDECTOMY    . CESAREAN SECTION    . COLONOSCOPY N/A 05/04/2018   Procedure: COLONOSCOPY;  Surgeon: Daneil Dolin, MD;  [facility];  Service: Endoscopy;  Laterality: N/A;  2:00pm  . PILONIDAL CYST EXCISION    . ROTATOR CUFF REPAIR Right     There were no vitals filed for this visit.  Subjective Assessment - 02/02/20 1523    Subjective   S: I can only take my pain medication at night because of how it feels.    Pertinent History  Patient is a 54 y/o female S/P right shoulder arthroscopy, SAD, and open DCR, open RTC repair, and open bicep tendodesis which occurred on 01/30/2020. Dr. Veverly Fells has referred patient to occupational therapy for evaluation and treatment.    Patient Stated Goals  to  return to using her LUE as normal as possible.    Currently in Pain?  Yes    Pain Score  6     Pain Location  Shoulder    Pain Orientation  Left    Pain Descriptors / Indicators  Sharp;Constant    Pain Type  Surgical pain    Pain Radiating Towards  N/A    Pain Onset  In the past 7 days    Pain Frequency  Constant    Aggravating Factors   movement    Pain Relieving Factors  pain medication, ice    Effect of Pain on Daily Activities  Patient is unable to utilize her LUE for any daily tasks.    Multiple Pain Sites  No        [facility] OT Assessment - 02/02/20 1525      Assessment   Medical Diagnosis  right RTC repair, SAD, DCR    Referring Provider (OT)  Esmond Plants, MD    Onset Date/Surgical Date  01/30/20    Hand Dominance  Right    Next MD Visit  02/09/20    Prior Therapy  None for left shoulder.       Precautions   Precautions  Shoulder    Type of  Shoulder Precautions  See protocol. No abduction for 3 months.     Shoulder Interventions  Shoulder sling/immobilizer;Off for dressing/bathing/exercises;At all times      Restrictions   Weight Bearing Restrictions  Yes      Balance Screen   Has the patient fallen in the past 6 months  Yes    How many times?  2    Has the patient had a decrease in activity level because of a fear of falling?   No    Is the patient reluctant to leave their home because of a fear of falling?   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      ADL   ADL comments  Pt unable to complete any task with LUE.       Written Expression   Dominant Hand  Right      Vision - History   Baseline Vision  Wears glasses all the time      Cognition   Overall Cognitive Status  Within Functional Limits for tasks assessed       Observation/Other Assessments   Focus on Therapeutic Outcomes (FOTO)   4/100      ROM / Strength   AROM / PROM / Strength  AROM;PROM;Strength      Palpation   Palpation comment  Max fascial restrictions in left upper arm, trapezius, and scapularis region.       AROM   Overall AROM   Unable to assess;Due to precautions      PROM   Overall PROM Comments  Assessed in supine. IR/er adducted. Two towels for support.     PROM Assessment Site  Shoulder    Right/Left Shoulder  Left    Left Shoulder Flexion  46 Degrees    Left Shoulder ABduction  --   unable to assess due to protocol   Left Shoulder Internal Rotation  60 Degrees    Left Shoulder External Rotation  0 Degrees      Strength   Overall Strength  Unable to assess;Due to precautions                      OT Education - 02/02/20 1842    Education Details  pendulums, A/ROM wrist and forearm, P/ROM bicep flexion/extension    Person(s) Educated  Patient    Methods  Explanation;Demonstration;Verbal cues;Handout    Comprehension  Verbalized understanding       OT Short Term Goals - 02/02/20 1851      OT SHORT TERM GOAL #1   Title  Patient will be educated and independent with HEP in order to increase functional use of her LUE and allow her to begin using it for 50% or more of daily tasks.    Time  6    Period  Weeks    Status  New    Target Date  03/15/20      OT SHORT TERM GOAL #2   Title  Patient will increase LUE (shoulder/elbow)  P/ROM to [facility] in order increase ability to get her shirts on and off with less difficulty.    Time  6    Period  Weeks  Status  New      OT SHORT TERM GOAL #3   Title  Patient will decrease fascial restrictions to moderate amount in order to increase functional mobility needed to complete low level reaching tasks.    Time  6    Period  Weeks    Status  New      OT SHORT TERM GOAL #4   Title  Patient will decrease pain level to approximately 5/10 or less when completing  basic self care tasks such as bathing.    Time  6    Period  Weeks    Status  New      OT SHORT TERM GOAL #5   Title  Patient will  increase her LUE shoulder strength to 3/5 in order to complete household tasks at or below shoulder level.    Time  6    Period  Weeks    Status  New        OT Long Term Goals - 02/02/20 1854      OT LONG TERM GOAL #1   Title  Patient will return to using her LUE for all daily tasks 75% or more of the time while working towards returning to work.    Time  12    Period  Weeks    Status  New    Target Date  04/26/20      OT LONG TERM GOAL #2   Title  Patient will increase her left shoulder and elbow A/ROM to [facility] or better to complete overhead reaching tasks.    Time  12    Period  Weeks    Status  New      OT LONG TERM GOAL #3   Title  Patient will increase her left shoulder strength to 4+/5 to increase ability to lift heavier weight while working towards returning to work tasks.    Time  12    Period  Weeks    Status  New      OT LONG TERM GOAL #4   Title  Patient will decrease fascial restrictions to min amount or less in order to increase functional mobility needed to complete reaching tasks.    Time  12    Period  Weeks    Status  New      OT LONG TERM GOAL #5   Title  Patient will report a decrease in pain of approximately 3/10 or less when completing daily tasks using her LUE.    Time  12    Period  Weeks    Status  New            Plan - 02/02/20 1846    Clinical Impression Statement  A: Patient is a 54 y/o female S/P Right RTC repair causing increased pain, fascial restrictions, and decreased ROM and strength resulting in difficulty completing daily and work related tasks.    OT Occupational Profile and History  Problem Focused Assessment - Including review of records relating to presenting problem    Occupational performance deficits (Please refer to evaluation for details):  ADL's;IADL's;Work;Rest and Sleep;Leisure    Body  Structure / Function / Physical Skills  ADL;UE functional use;Fascial restriction;Pain;ROM;Scar mobility;Strength    Rehab Potential  Excellent    Clinical Decision Making  Multiple treatment options, significant modification of task necessary    Comorbidities Affecting Occupational Performance:  May have comorbidities impacting occupational performance    Modification or Assistance to Complete Evaluation   Min-Moderate  modification of tasks or assist with assess necessary to complete eval    OT Frequency  Other (comment)   1X a week for first 4 weeks then increase to 2X a week if needed.   OT Duration  12 weeks    OT Treatment/Interventions  Self-care/ADL training;Ultrasound;Patient/family education;Scar mobilization;DME and/or AE instruction;Passive range of motion;Cryotherapy;Electrical Stimulation;Moist Heat;Neuromuscular education;Therapeutic activities;Manual Therapy;Therapeutic exercise    Plan  P: Patient will benefit from skilled OT services to increase functional use of LUE during daily and work related tasks. Treatment Plan: Follow protocol. Myofascial release, manual stretching, P/ROM AA/ROM, A/ROM, strengthening. Modalities PRN.    OT Home Exercise Plan  Eval: pendulums, A/ROM wrist and forearm, P/ROM elbow    Consulted and Agree with Plan of Care  Patient       Patient will benefit from skilled therapeutic intervention in order to improve the following deficits and impairments:   Body Structure / Function / Physical Skills: ADL, UE functional use, Fascial restriction, Pain, ROM, Scar mobility, Strength       Visit Diagnosis: Other symptoms and signs involving the musculoskeletal system - Plan: Ot plan of care cert/re-cert  Stiffness of left shoulder, not elsewhere classified - Plan: Ot plan of care cert/re-cert  Acute pain of left shoulder - Plan: Ot plan of care cert/re-cert  Stiffness of left elbow, not elsewhere classified - Plan: Ot plan of care  cert/re-cert    Problem List Patient Active Problem List   Diagnosis Date Noted  . Atypical pneumonia 08/14/2018  . Nausea vomiting and diarrhea 08/14/2018  . Hyponatremia 08/14/2018  . Hypokalemia 08/14/2018  . Reactive thrombocytosis 08/14/2018  . Anxiety 08/14/2018  . Loss of weight 06/15/2018  . Abdominal pain 03/11/2018  . Loose stools 03/11/2018   Ailene Ravel, OTR/L,CBIS  [phone]  02/02/2020, 7:00 PM  [facility] [address] [place], [place], [address] Phone: [phone]   Fax:  [phone]  Name: JULIANI KENISON MRN: [id number] Date of Birth: 04/25/1966

## 2020-02-08 ENCOUNTER — Other Ambulatory Visit: Payer: Self-pay

## 2020-02-08 ENCOUNTER — Ambulatory Visit (HOSPITAL_COMMUNITY): Payer: 59

## 2020-02-08 ENCOUNTER — Encounter (HOSPITAL_COMMUNITY): Payer: Self-pay

## 2020-02-08 DIAGNOSIS — M25512 Pain in left shoulder: Secondary | ICD-10-CM

## 2020-02-08 DIAGNOSIS — M25612 Stiffness of left shoulder, not elsewhere classified: Secondary | ICD-10-CM

## 2020-02-08 DIAGNOSIS — R29898 Other symptoms and signs involving the musculoskeletal system: Secondary | ICD-10-CM | POA: Diagnosis not present

## 2020-02-08 NOTE — Therapy (Signed)
[facility] [place], [place], [address] Phone: [phone]   Fax:  [phone]  Occupational Therapy Treatment  Patient Details  Name: Dawn Edwards MRN: [id number] Date of Birth: [birth year] Referring Provider (OT): Esmond Plants, MD   Encounter Date: 02/08/2020  OT End of Session - 02/08/20 1342    Visit Number  2    Number of Visits  20    Date for OT Re-Evaluation  04/26/20   mini reassess:03/01/20   Authorization Type  [employer]    Authorization Time Period  No authorization needed. 90 combined visits PT/OT/SLP. 7 used.    Authorization - Visit Number  2    Authorization - Number of Visits  57    OT Start Time  [id number]   Pt checked in late   OT Stop Time  1339    OT Time Calculation (min)  34 min    Activity Tolerance  Patient limited by pain    Behavior During Therapy  [facility] for tasks assessed/performed;Flat affect       Past Medical History:  Diagnosis Date  . Anxiety   . Arthritis    patient reported  . Depression   . Hypertension   . IBS (irritable bowel syndrome)   . 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 APPENDECTOMY    . CESAREAN SECTION    . COLONOSCOPY N/A 05/04/2018   Procedure: COLONOSCOPY;  Surgeon: Daneil Dolin, MD;  [facility];  Service: Endoscopy;  Laterality: N/A;  2:00pm  . PILONIDAL CYST EXCISION    . ROTATOR CUFF REPAIR Right     There were no vitals filed for this visit.  Subjective Assessment - 02/08/20 1331    Subjective   S: This morning it was terrible.    Currently in Pain?  Yes    Pain Score  3     Pain Location  Shoulder    Pain Orientation  Left    Pain Descriptors / Indicators  Sore;Aching;Constant    Pain Type  Surgical pain    Pain Radiating Towards  up to neck and back to scapula    Pain Onset  In the past 7 days    Pain Frequency  Constant     Aggravating Factors   movement    Pain Relieving Factors  pain medication, ice    Effect of Pain on Daily Activities  Pt is unable to utilize her LUE for any daily tasks.    Multiple Pain Sites  No         [facility] OT Assessment - 02/08/20 1332      Assessment   Medical Diagnosis  right RTC repair, SAD, DCR      Precautions   Precautions  Shoulder    Type of Shoulder Precautions  See protocol. No abduction for 3 months.     Shoulder Interventions  Shoulder sling/immobilizer;Off for dressing/bathing/exercises;At all times               OT Treatments/Exercises (OP) - 02/08/20 1332      Exercises   Exercises  Shoulder      Shoulder Exercises: Supine   Protraction  PROM;5 reps    Horizontal ABduction  --   N/A   External Rotation  PROM;5 reps    Internal Rotation  PROM;5 reps    Flexion  PROM;5 reps  ABduction  --   N/A     Shoulder Exercises: Seated   Other Seated Exercises  scapular depression; A/ROM; 10X      Shoulder Exercises: Isometric Strengthening   Flexion  Supine;3X3"    Extension  Supine;3X3"    External Rotation  Supine;3X3"    Internal Rotation  Supine;3X3"    ADduction  Supine;3X3"      Manual Therapy   Manual Therapy  Myofascial release    Manual therapy comments  Manual therapy completed prior to exercises.     Myofascial Release  Myofascial release and manual stretching completed to left upper arm, trapezius, and scapularis region.              OT Education - 02/08/20 1341    Education Details  Education provided during passive stretching for breathing techniques to assist with pain and muscle guarding.    Person(s) Educated  Patient    Methods  Explanation;Demonstration;Verbal cues    Comprehension  Verbalized understanding;Need further instruction;Returned demonstration       OT Short Term Goals - 02/08/20 1308      OT SHORT TERM GOAL #1   Title  Patient will be educated and independent with HEP in order to increase  functional use of her LUE and allow her to begin using it for 50% or more of daily tasks.    Time  6    Period  Weeks    Status  On-going    Target Date  03/15/20      OT SHORT TERM GOAL #2   Title  Patient will increase LUE (shoulder/elbow)  P/ROM to [facility] in order increase ability to get her shirts on and off with less difficulty.    Time  6    Period  Weeks    Status  On-going      OT SHORT TERM GOAL #3   Title  Patient will decrease fascial restrictions to moderate amount in order to increase functional mobility needed to complete low level reaching tasks.    Time  6    Period  Weeks    Status  On-going      OT SHORT TERM GOAL #4   Title  Patient will decrease pain level to approximately 5/10 or less when completing basic self care tasks such as bathing.    Time  6    Period  Weeks    Status  On-going      OT SHORT TERM GOAL #5   Title  Patient will  increase her LUE shoulder strength to 3/5 in order to complete household tasks at or below shoulder level.    Time  6    Period  Weeks    Status  On-going        OT Long Term Goals - 02/08/20 1308      OT LONG TERM GOAL #1   Title  Patient will return to using her LUE for all daily tasks 75% or more of the time while working towards returning to work.    Time  12    Period  Weeks    Status  On-going      OT LONG TERM GOAL #2   Title  Patient will increase her left shoulder and elbow A/ROM to [facility] or better to complete overhead reaching tasks.    Time  12    Period  Weeks    Status  On-going      OT LONG TERM GOAL #3  Title  Patient will increase her left shoulder strength to 4+/5 to increase ability to lift heavier weight while working towards returning to work tasks.    Time  12    Period  Weeks    Status  On-going      OT LONG TERM GOAL #4   Title  Patient will decrease fascial restrictions to min amount or less in order to increase functional mobility needed to complete reaching tasks.    Time  12    Period   Weeks    Status  On-going      OT LONG TERM GOAL #5   Title  Patient will report a decrease in pain of approximately 3/10 or less when completing daily tasks using her LUE.    Time  12    Period  Weeks    Status  On-going            Plan - 02/08/20 1343    Clinical Impression Statement  A: initiated myofascial release (very gental and remained away from incision/steri strips), completed passive stretching while following protocol (no abduction), added bridging and isometric strengthening supine. Patient experienced increased pain and all exercises were completed to tolerance and with frequent rest breaks as needed. VC for form and technique were provided. Manual techniques completed to anterior portion of deltoid, and upper trapezius and scalene region to address fascial restrictions.    Body Structure / Function / Physical Skills  ADL;UE functional use;Fascial restriction;Pain;ROM;Scar mobility;Strength    Plan  P: Follow up on MD visit. Continue with protocol. Very light myofascial release and trigger point release, slow passive stretching within pain tolerance. Add seated scapular A/ROM (extension, row), A/ROM elbow flexion/extension. Therapy ball flexion.    Consulted and Agree with Plan of Care  Patient       Patient will benefit from skilled therapeutic intervention in order to improve the following deficits and impairments:   Body Structure / Function / Physical Skills: ADL, UE functional use, Fascial restriction, Pain, ROM, Scar mobility, Strength       Visit Diagnosis: Other symptoms and signs involving the musculoskeletal system  Stiffness of left shoulder, not elsewhere classified  Acute pain of left shoulder    Problem List Patient Active Problem List   Diagnosis Date Noted  . Atypical pneumonia 08/14/2018  . Nausea vomiting and diarrhea 08/14/2018  . Hyponatremia 08/14/2018  . Hypokalemia 08/14/2018  . Reactive thrombocytosis 08/14/2018  . Anxiety  08/14/2018  . Loss of weight 06/15/2018  . Abdominal pain 03/11/2018  . Loose stools 03/11/2018   Ailene Ravel, OTR/L,CBIS  [phone]  02/08/2020, 1:47 PM  [facility] [address] [place], [place], [address] Phone: [phone]   Fax:  [phone]  Name: Dawn Edwards MRN: [id number] Date of Birth: Nov 15, 1965

## 2020-02-14 ENCOUNTER — Ambulatory Visit (HOSPITAL_COMMUNITY): Payer: 59 | Attending: Orthopedic Surgery | Admitting: Occupational Therapy

## 2020-02-14 ENCOUNTER — Other Ambulatory Visit: Payer: Self-pay

## 2020-02-14 ENCOUNTER — Telehealth: Payer: Self-pay | Admitting: Dermatology

## 2020-02-14 ENCOUNTER — Encounter (HOSPITAL_COMMUNITY): Payer: Self-pay | Admitting: Occupational Therapy

## 2020-02-14 DIAGNOSIS — R29898 Other symptoms and signs involving the musculoskeletal system: Secondary | ICD-10-CM | POA: Diagnosis not present

## 2020-02-14 DIAGNOSIS — M25612 Stiffness of left shoulder, not elsewhere classified: Secondary | ICD-10-CM | POA: Diagnosis present

## 2020-02-14 DIAGNOSIS — M25512 Pain in left shoulder: Secondary | ICD-10-CM | POA: Insufficient documentation

## 2020-02-14 NOTE — Telephone Encounter (Signed)
Phone call from patient returning our call.  Patient informed that we do not handle/do short term disability.  Patient aware and okay with that.

## 2020-02-14 NOTE — Telephone Encounter (Signed)
Phone call to patient regarding the phone call we received from [employer] regarding short term disability.  Voicemail left for patient to return our call.

## 2020-02-14 NOTE — Telephone Encounter (Signed)
[employer] needs note for short term disability. They faxed in form. Needs documentation for last visit and her work status.  Neew patient for us-no chart.

## 2020-02-14 NOTE — Therapy (Signed)
[place], [place], [address] Phone: [phone]   Fax:  [phone]  Occupational Therapy Treatment  Patient Details  Name: Dawn Edwards MRN: [id number] Date of Birth: 1966-11-01 Referring Provider (OT): Esmond Plants, MD   Encounter Date: 02/14/2020  OT End of Session - 02/14/20 1507    Visit Number  3    Number of Visits  20    Date for OT Re-Evaluation  04/26/20   mini reassess:03/01/20   Authorization Type  [employer]    Authorization Time Period  No authorization needed. 90 combined visits PT/OT/SLP. 7 used.    Authorization - Visit Number  3    Authorization - Number of Visits  60    OT Start Time  [id number]    OT Stop Time  1505    OT Time Calculation (min)  33 min    Activity Tolerance  Patient limited by pain    Behavior During Therapy  [facility] for tasks assessed/performed;Flat affect       Past Medical History:  Diagnosis Date  . Anxiety   . Arthritis    patient reported  . Depression   . Hypertension   . IBS (irritable bowel syndrome)   . 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 APPENDECTOMY    . CESAREAN SECTION    . COLONOSCOPY N/A 05/04/2018   Procedure: COLONOSCOPY;  Surgeon: Daneil Dolin, MD;  [facility];  Service: Endoscopy;  Laterality: N/A;  2:00pm  . PILONIDAL CYST EXCISION    . ROTATOR CUFF REPAIR Right     There were no vitals filed for this visit.  Subjective Assessment - 02/14/20 1430    Subjective   S: She hurt me last time, I complained to my doctor about it.    Currently in Pain?  Yes    Pain Score  2     Pain Location  Shoulder    Pain Orientation  Left    Pain Descriptors / Indicators  Aching;Sore    Pain Type  Surgical pain    Pain Radiating Towards  up to neck and back towards scapula    Pain Onset  1 to 4 weeks ago    Pain Frequency  Constant     Aggravating Factors   movement    Pain Relieving Factors  pain medication, ice    Effect of Pain on Daily Activities  unable to use LUE for any daily tasks.    Multiple Pain Sites  No         [facility] OT Assessment - 02/14/20 1430      Assessment   Medical Diagnosis  right RTC repair, SAD, DCR      Precautions   Precautions  Shoulder    Type of Shoulder Precautions  See protocol. No abduction for 3 months.     Shoulder Interventions  Shoulder sling/immobilizer;Off for dressing/bathing/exercises;At all times               OT Treatments/Exercises (OP) - 02/14/20 1436      Exercises   Exercises  Shoulder      Shoulder Exercises: Supine   Protraction  PROM;5 reps    Horizontal ABduction  --   N/A   External Rotation  PROM;5 reps    Internal Rotation  PROM;5 reps    Flexion  PROM;5 reps  ABduction  --   N/A     Shoulder Exercises: Seated   Extension  AROM;10 reps    Row  AROM;10 reps    Other Seated Exercises  scapular depression; A/ROM; 10X      Shoulder Exercises: Therapy Ball   Flexion  10 reps      Shoulder Exercises: Isometric Strengthening   Flexion  Supine;3X3"    Extension  Supine;3X3"    External Rotation  Supine;3X3"    Internal Rotation  Supine;3X3"    ADduction  Supine;3X3"      Manual Therapy   Manual Therapy  Myofascial release    Manual therapy comments  Manual therapy completed prior to exercises.     Myofascial Release  Myofascial release and manual stretching completed to left upper arm, trapezius, and scapularis region.                OT Short Term Goals - 02/08/20 1308      OT SHORT TERM GOAL #1   Title  Patient will be educated and independent with HEP in order to increase functional use of her LUE and allow her to begin using it for 50% or more of daily tasks.    Time  6    Period  Weeks    Status  On-going    Target Date  03/15/20      OT SHORT TERM GOAL #2   Title  Patient will increase LUE (shoulder/elbow)  P/ROM  to [place] in order increase ability to get her shirts on and off with less difficulty.    Time  6    Period  Weeks    Status  On-going      OT SHORT TERM GOAL #3   Title  Patient will decrease fascial restrictions to moderate amount in order to increase functional mobility needed to complete low level reaching tasks.    Time  6    Period  Weeks    Status  On-going      OT SHORT TERM GOAL #4   Title  Patient will decrease pain level to approximately 5/10 or less when completing basic self care tasks such as bathing.    Time  6    Period  Weeks    Status  On-going      OT SHORT TERM GOAL #5   Title  Patient will  increase her LUE shoulder strength to 3/5 in order to complete household tasks at or below shoulder level.    Time  6    Period  Weeks    Status  On-going        OT Long Term Goals - 02/08/20 1308      OT LONG TERM GOAL #1   Title  Patient will return to using her LUE for all daily tasks 75% or more of the time while working towards returning to work.    Time  12    Period  Weeks    Status  On-going      OT LONG TERM GOAL #2   Title  Patient will increase her left shoulder and elbow A/ROM to [facility] or better to complete overhead reaching tasks.    Time  12    Period  Weeks    Status  On-going      OT LONG TERM GOAL #3   Title  Patient will increase her left shoulder strength to 4+/5 to increase ability to lift heavier weight while working towards returning to work  tasks.    Time  12    Period  Weeks    Status  On-going      OT LONG TERM GOAL #4   Title  Patient will decrease fascial restrictions to min amount or less in order to increase functional mobility needed to complete reaching tasks.    Time  12    Period  Weeks    Status  On-going      OT LONG TERM GOAL #5   Title  Patient will report a decrease in pain of approximately 3/10 or less when completing daily tasks using her LUE.    Time  12    Period  Weeks    Status  On-going            Plan -  02/14/20 1501    Clinical Impression Statement  A: Continued with myofascial release and manual techniques, pt with improved tolerance to manual techniques today. Continued with gentle P/ROM and isometrics, added scapular A/ROM for row and extension. Also added therapy ball flexion stretching. Verbal cuing for form and technique. Ended session after therapy ball stretching when pt beginning to experience increased pain.    Body Structure / Function / Physical Skills  ADL;UE functional use;Fascial restriction;Pain;ROM;Scar mobility;Strength    Plan  P: Continue with protocol, P/ROM working on continuing to improve tolerance    OT Home Exercise Plan  Eval: pendulums, A/ROM wrist and forearm, P/ROM elbow    Consulted and Agree with Plan of Care  Patient       Patient will benefit from skilled therapeutic intervention in order to improve the following deficits and impairments:   Body Structure / Function / Physical Skills: ADL, UE functional use, Fascial restriction, Pain, ROM, Scar mobility, Strength       Visit Diagnosis: Other symptoms and signs involving the musculoskeletal system  Stiffness of left shoulder, not elsewhere classified  Acute pain of left shoulder    Problem List Patient Active Problem List   Diagnosis Date Noted  . Atypical pneumonia 08/14/2018  . Nausea vomiting and diarrhea 08/14/2018  . Hyponatremia 08/14/2018  . Hypokalemia 08/14/2018  . Reactive thrombocytosis 08/14/2018  . Anxiety 08/14/2018  . Loss of weight 06/15/2018  . Abdominal pain 03/11/2018  . Loose stools 03/11/2018   Guadelupe Sabin, OTR/L  [phone] 02/14/2020, 3:11 PM  [place] [address] [place], [place], [address] Phone: [phone]   Fax:  [phone]  Name: LOLENE SHAMS MRN: [id number] Date of Birth: Sep 18, 1966

## 2020-02-15 ENCOUNTER — Encounter (HOSPITAL_COMMUNITY): Payer: 59 | Admitting: Occupational Therapy

## 2020-02-21 ENCOUNTER — Encounter (HOSPITAL_COMMUNITY): Payer: Self-pay | Admitting: Occupational Therapy

## 2020-02-21 ENCOUNTER — Ambulatory Visit (HOSPITAL_COMMUNITY): Payer: 59 | Admitting: Occupational Therapy

## 2020-02-21 ENCOUNTER — Other Ambulatory Visit: Payer: Self-pay

## 2020-02-21 DIAGNOSIS — M25512 Pain in left shoulder: Secondary | ICD-10-CM

## 2020-02-21 DIAGNOSIS — R29898 Other symptoms and signs involving the musculoskeletal system: Secondary | ICD-10-CM

## 2020-02-21 DIAGNOSIS — M25612 Stiffness of left shoulder, not elsewhere classified: Secondary | ICD-10-CM

## 2020-02-21 NOTE — Therapy (Signed)
[place], [place], [address] Phone: [phone]   Fax:  [phone]  Occupational Therapy Treatment  Patient Details  Name: Dawn Edwards MRN: [id number] Date of Birth: [birth year] Referring Provider (OT): Esmond Plants, MD   Encounter Date: 02/21/2020  OT End of Session - 02/21/20 1600    Visit Number  4    Number of Visits  20    Date for OT Re-Evaluation  04/26/20   mini reassess:03/01/20   Authorization Type  [employer]    Authorization Time Period  No authorization needed. 90 combined visits PT/OT/SLP. 7 used.    Authorization - Visit Number  4    Authorization - Number of Visits  83    OT Start Time  [id number]    OT Stop Time  1557    OT Time Calculation (min)  38 min    Activity Tolerance  Patient tolerated treatment well    Behavior During Therapy  WFL for tasks assessed/performed;Flat affect       Past Medical History:  Diagnosis Date  . Anxiety   . Arthritis    patient reported  . Depression   . Hypertension   . IBS (irritable bowel syndrome)   . 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 APPENDECTOMY    . CESAREAN SECTION    . COLONOSCOPY N/A 05/04/2018   Procedure: COLONOSCOPY;  Surgeon: Daneil Dolin, MD;  [facility];  Service: Endoscopy;  Laterality: N/A;  2:00pm  . PILONIDAL CYST EXCISION    . ROTATOR CUFF REPAIR Right     There were no vitals filed for this visit.  Subjective Assessment - 02/21/20 1523    Subjective   S: My bicep has been hurting if I move it.    Currently in Pain?  Yes    Pain Score  2     Pain Location  Shoulder    Pain Orientation  Left    Pain Descriptors / Indicators  Aching;Sore    Pain Type  Acute pain    Pain Radiating Towards  down bicep    Pain Onset  1 to 4 weeks ago    Pain Frequency  Intermittent    Aggravating Factors   movement     Pain Relieving Factors  pain medication, ic    Effect of Pain on Daily Activities  unable to use LUE for any daily tasks    Multiple Pain Sites  No         [facility] OT Assessment - 02/21/20 1522      Assessment   Medical Diagnosis  right RTC repair, SAD, DCR      Precautions   Precautions  Shoulder    Type of Shoulder Precautions  See protocol. No abduction for 3 months.     Shoulder Interventions  Shoulder sling/immobilizer;Off for dressing/bathing/exercises;At all times               OT Treatments/Exercises (OP) - 02/21/20 1523      Exercises   Exercises  Shoulder      Shoulder Exercises: Supine   Protraction  PROM;5 reps    Horizontal ABduction  --   N/A   External Rotation  PROM;5 reps;AAROM;10 reps    Internal Rotation  PROM;5 reps;AAROM;10 reps    Flexion  PROM;5 reps    ABduction  --  N/A     Shoulder Exercises: Seated   Extension  AROM;10 reps    Row  AROM;10 reps    Other Seated Exercises  scapular depression; A/ROM; 10X      Shoulder Exercises: Therapy Ball   Flexion  10 reps      Shoulder Exercises: Isometric Strengthening   Flexion  Supine;3X5"    Extension  Supine;3X5"    External Rotation  Supine;3X5"    Internal Rotation  Supine;3X5"    ADduction  Supine;3X5"      Manual Therapy   Manual Therapy  Myofascial release    Manual therapy comments  Manual therapy completed prior to exercises.     Myofascial Release  Myofascial release and manual stretching completed to left upper arm, trapezius, and scapularis region.                OT Short Term Goals - 02/08/20 1308      OT SHORT TERM GOAL #1   Title  Patient will be educated and independent with HEP in order to increase functional use of her LUE and allow her to begin using it for 50% or more of daily tasks.    Time  6    Period  Weeks    Status  On-going    Target Date  03/15/20      OT SHORT TERM GOAL #2   Title  Patient will increase LUE (shoulder/elbow)  P/ROM to [facility]  in order increase ability to get her shirts on and off with less difficulty.    Time  6    Period  Weeks    Status  On-going      OT SHORT TERM GOAL #3   Title  Patient will decrease fascial restrictions to moderate amount in order to increase functional mobility needed to complete low level reaching tasks.    Time  6    Period  Weeks    Status  On-going      OT SHORT TERM GOAL #4   Title  Patient will decrease pain level to approximately 5/10 or less when completing basic self care tasks such as bathing.    Time  6    Period  Weeks    Status  On-going      OT SHORT TERM GOAL #5   Title  Patient will  increase her LUE shoulder strength to 3/5 in order to complete household tasks at or below shoulder level.    Time  6    Period  Weeks    Status  On-going        OT Long Term Goals - 02/08/20 1308      OT LONG TERM GOAL #1   Title  Patient will return to using her LUE for all daily tasks 75% or more of the time while working towards returning to work.    Time  12    Period  Weeks    Status  On-going      OT LONG TERM GOAL #2   Title  Patient will increase her left shoulder and elbow A/ROM to [facility] or better to complete overhead reaching tasks.    Time  12    Period  Weeks    Status  On-going      OT LONG TERM GOAL #3   Title  Patient will increase her left shoulder strength to 4+/5 to increase ability to lift heavier weight while working towards returning to work tasks.    Time  12    Period  Weeks    Status  On-going      OT LONG TERM GOAL #4   Title  Patient will decrease fascial restrictions to min amount or less in order to increase functional mobility needed to complete reaching tasks.    Time  12    Period  Weeks    Status  On-going      OT LONG TERM GOAL #5   Title  Patient will report a decrease in pain of approximately 3/10 or less when completing daily tasks using her LUE.    Time  12    Period  Weeks    Status  On-going            Plan - 02/21/20  1600    Clinical Impression Statement  A: Continued with manual therapy and passive stretching, pt with improved tolerance to manual techniques allowing for increased pressure during myofascial release. Pt reports bicep pain when activated during ADLs, no pain during passive stretching or exercises today. Increased isometrics to 5x5" and continued with scapular ROM and ball stretches. Added supine AA/ROM er/IR per protocol. Verbal cuing for form and technique.    Body Structure / Function / Physical Skills  ADL;UE functional use;Fascial restriction;Pain;ROM;Scar mobility;Strength    Plan  P: continue with protocol, manual techniques, P/ROM, add shoulder glides    OT Home Exercise Plan  Eval: pendulums, A/ROM wrist and forearm, P/ROM elbow    Consulted and Agree with Plan of Care  Patient       Patient will benefit from skilled therapeutic intervention in order to improve the following deficits and impairments:   Body Structure / Function / Physical Skills: ADL, UE functional use, Fascial restriction, Pain, ROM, Scar mobility, Strength       Visit Diagnosis: Other symptoms and signs involving the musculoskeletal system  Stiffness of left shoulder, not elsewhere classified  Acute pain of left shoulder    Problem List Patient Active Problem List   Diagnosis Date Noted  . Atypical pneumonia 08/14/2018  . Nausea vomiting and diarrhea 08/14/2018  . Hyponatremia 08/14/2018  . Hypokalemia 08/14/2018  . Reactive thrombocytosis 08/14/2018  . Anxiety 08/14/2018  . Loss of weight 06/15/2018  . Abdominal pain 03/11/2018  . Loose stools 03/11/2018   Guadelupe Sabin, OTR/L  [phone] 02/21/2020, 4:04 PM  [facility] [address] [place], [place], [address] Phone: [phone]   Fax:  [phone]  Name: Dawn Edwards MRN: [id number] Date of Birth: Jul 23, 1966

## 2020-02-22 ENCOUNTER — Ambulatory Visit (HOSPITAL_COMMUNITY): Payer: 59 | Admitting: Occupational Therapy

## 2020-02-29 ENCOUNTER — Encounter (HOSPITAL_COMMUNITY): Payer: Self-pay

## 2020-02-29 ENCOUNTER — Ambulatory Visit (HOSPITAL_COMMUNITY): Payer: 59

## 2020-02-29 ENCOUNTER — Other Ambulatory Visit: Payer: Self-pay

## 2020-02-29 DIAGNOSIS — M25512 Pain in left shoulder: Secondary | ICD-10-CM

## 2020-02-29 DIAGNOSIS — R29898 Other symptoms and signs involving the musculoskeletal system: Secondary | ICD-10-CM

## 2020-02-29 DIAGNOSIS — M25612 Stiffness of left shoulder, not elsewhere classified: Secondary | ICD-10-CM

## 2020-02-29 NOTE — Therapy (Signed)
[place], [place], [address] Phone: [phone]   Fax:  [phone]  Occupational Therapy Treatment  Patient Details  Name: Dawn Edwards MRN: [id number] Date of Birth: 02/20/1966 Referring Provider (OT): Esmond Plants, MD   Encounter Date: 02/29/2020  OT End of Session - 02/29/20 1422    Visit Number  5    Number of Visits  20    Date for OT Re-Evaluation  04/26/20   mini reassess:03/01/20   Authorization Type  [employer]    Authorization Time Period  No authorization needed. 90 combined visits PT/OT/SLP. 7 used.    Authorization - Visit Number  5    Authorization - Number of Visits  27    OT Start Time  [id number]   pt arrived late   OT Stop Time  1424    OT Time Calculation (min)  29 min    Activity Tolerance  Patient tolerated treatment well    Behavior During Therapy  WFL for tasks assessed/performed       Past Medical History:  Diagnosis Date  . Anxiety   . Arthritis    patient reported  . Depression   . Hypertension   . IBS (irritable bowel syndrome)   . 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 APPENDECTOMY    . CESAREAN SECTION    . COLONOSCOPY N/A 05/04/2018   Procedure: COLONOSCOPY;  Surgeon: Daneil Dolin, MD;  [facility];  Service: Endoscopy;  Laterality: N/A;  2:00pm  . PILONIDAL CYST EXCISION    . ROTATOR CUFF REPAIR Right     There were no vitals filed for this visit.  Subjective Assessment - 02/29/20 1413    Subjective   S: My elbow and hand are hurting me more than anything.    Currently in Pain?  Yes    Pain Score  2     Pain Location  Shoulder    Pain Orientation  Left    Pain Descriptors / Indicators  Sore    Pain Type  Acute pain    Pain Radiating Towards  N/A    Pain Onset  1 to 4 weeks ago    Pain Frequency  Intermittent    Aggravating Factors    movement    Pain Relieving Factors  pain medication, ice, heat    Effect of Pain on Daily Activities  mod-max effect    Multiple Pain Sites  No         [facility] OT Assessment - 02/29/20 1415      Assessment   Medical Diagnosis  right RTC repair, SAD, DCR      Precautions   Precautions  Shoulder    Type of Shoulder Precautions  See protocol. No abduction for 3 months.     Shoulder Interventions  Shoulder sling/immobilizer;Off for dressing/bathing/exercises;At all times               OT Treatments/Exercises (OP) - 02/29/20 1416      Exercises   Exercises  Shoulder      Shoulder Exercises: Supine   Protraction  PROM;5 reps;AAROM;10 reps    Horizontal ABduction  --   N/A   External Rotation  PROM;5 reps;AAROM;15 reps    Internal Rotation  PROM;5 reps;AAROM;15 reps    Flexion  PROM;5 reps;AAROM;10 reps    ABduction  --  N/A     Shoulder Exercises: Therapy Ball   Flexion  10 reps      Manual Therapy   Manual Therapy  Myofascial release    Manual therapy comments  Manual therapy completed prior to exercises.     Myofascial Release  Myofascial release and manual stretching completed to left upper arm, trapezius, and scapularis region.                OT Short Term Goals - 02/08/20 1308      OT SHORT TERM GOAL #1   Title  Patient will be educated and independent with HEP in order to increase functional use of her LUE and allow her to begin using it for 50% or more of daily tasks.    Time  6    Period  Weeks    Status  On-going    Target Date  03/15/20      OT SHORT TERM GOAL #2   Title  Patient will increase LUE (shoulder/elbow)  P/ROM to [facility] in order increase ability to get her shirts on and off with less difficulty.    Time  6    Period  Weeks    Status  On-going      OT SHORT TERM GOAL #3   Title  Patient will decrease fascial restrictions to moderate amount in order to increase functional mobility needed to complete low level reaching tasks.     Time  6    Period  Weeks    Status  On-going      OT SHORT TERM GOAL #4   Title  Patient will decrease pain level to approximately 5/10 or less when completing basic self care tasks such as bathing.    Time  6    Period  Weeks    Status  On-going      OT SHORT TERM GOAL #5   Title  Patient will  increase her LUE shoulder strength to 3/5 in order to complete household tasks at or below shoulder level.    Time  6    Period  Weeks    Status  On-going        OT Long Term Goals - 02/08/20 1308      OT LONG TERM GOAL #1   Title  Patient will return to using her LUE for all daily tasks 75% or more of the time while working towards returning to work.    Time  12    Period  Weeks    Status  On-going      OT LONG TERM GOAL #2   Title  Patient will increase her left shoulder and elbow A/ROM to [facility] or better to complete overhead reaching tasks.    Time  12    Period  Weeks    Status  On-going      OT LONG TERM GOAL #3   Title  Patient will increase her left shoulder strength to 4+/5 to increase ability to lift heavier weight while working towards returning to work tasks.    Time  12    Period  Weeks    Status  On-going      OT LONG TERM GOAL #4   Title  Patient will decrease fascial restrictions to min amount or less in order to increase functional mobility needed to complete reaching tasks.    Time  12    Period  Weeks    Status  On-going  OT LONG TERM GOAL #5   Title  Patient will report a decrease in pain of approximately 3/10 or less when completing daily tasks using her LUE.    Time  12    Period  Weeks    Status  On-going            Plan - 02/29/20 1422    Clinical Impression Statement  A: patient reports more pain felt in left palm (nodules noted upon visual assessment) and in left elbow. Pt reports that her shoulder is hurting very minimally. Added protraction and flexion supine with pvc pipe. VC for form and technique were provided. Manual techniques  completed to address fascial restrictions although none palpated superficially during session.    Body Structure / Function / Physical Skills  ADL;UE functional use;Fascial restriction;Pain;ROM;Scar mobility;Strength    Plan  P: Progress to phase 2 of protocol. Mini reassessment    Consulted and Agree with Plan of Care  Patient       Patient will benefit from skilled therapeutic intervention in order to improve the following deficits and impairments:   Body Structure / Function / Physical Skills: ADL, UE functional use, Fascial restriction, Pain, ROM, Scar mobility, Strength       Visit Diagnosis: Stiffness of left shoulder, not elsewhere classified  Other symptoms and signs involving the musculoskeletal system  Acute pain of left shoulder    Problem List Patient Active Problem List   Diagnosis Date Noted  . Atypical pneumonia 08/14/2018  . Nausea vomiting and diarrhea 08/14/2018  . Hyponatremia 08/14/2018  . Hypokalemia 08/14/2018  . Reactive thrombocytosis 08/14/2018  . Anxiety 08/14/2018  . Loss of weight 06/15/2018  . Abdominal pain 03/11/2018  . Loose stools 03/11/2018   Ailene Ravel, OTR/L,CBIS  [phone]  02/29/2020, 2:34 PM  [facility] [address] [place], [place], [address] Phone: [phone]   Fax:  [phone]  Name: ASHLEAH GLADMAN MRN: [id number] Date of Birth: [birth year]

## 2020-03-05 ENCOUNTER — Encounter (HOSPITAL_COMMUNITY): Payer: Self-pay

## 2020-03-05 ENCOUNTER — Other Ambulatory Visit: Payer: Self-pay

## 2020-03-05 ENCOUNTER — Ambulatory Visit (HOSPITAL_COMMUNITY): Payer: 59

## 2020-03-05 DIAGNOSIS — R29898 Other symptoms and signs involving the musculoskeletal system: Secondary | ICD-10-CM

## 2020-03-05 DIAGNOSIS — M25612 Stiffness of left shoulder, not elsewhere classified: Secondary | ICD-10-CM

## 2020-03-05 DIAGNOSIS — M25512 Pain in left shoulder: Secondary | ICD-10-CM

## 2020-03-05 NOTE — Patient Instructions (Signed)
Perform each exercise ____10-15____ reps. 2-3x days.   1) Protraction   Start by holding a wand or cane at chest height.  Next, slowly push the wand outwards in front of your body so that your elbows become fully straightened. Then, return to the original position.     2) Shoulder FLEXION   In the standing position, hold a wand/cane with both arms, palms down on both sides. Raise up the wand/cane allowing your unaffected arm to perform most of the effort. Your affected arm should be partially relaxed.      3) Internal/External ROTATION   In the standing position, hold a wand/cane with both hands keeping your elbows bent. Move your arms and wand/cane to one side.  Your affected arm should be partially relaxed while your unaffected arm performs most of the effort.                 

## 2020-03-05 NOTE — Therapy (Signed)
[facility] [address] [place], [place], [address] Phone: [phone]   Fax:  [phone]  Occupational Therapy Treatment  Patient Details  Name: Dawn Edwards MRN: [id number] Date of Birth: [birth year] Referring Provider (OT): Esmond Plants, MD   Progress Note Reporting Period 02/02/20 to 03/05/20  See note below for Objective Data and Assessment of Progress/Goals.       Encounter Date: 03/05/2020  OT End of Session - 03/05/20 1329    Visit Number  6    Number of Visits  20    Date for OT Re-Evaluation  04/26/20   mini reassess:04/02/20   Authorization Type  [employer]    Authorization Time Period  No authorization needed. 90 combined visits PT/OT/SLP. 7 used.    Authorization - Visit Number  6    Authorization - Number of Visits  39    OT Start Time  [id number]   mini reassessment   OT Stop Time  1340    OT Time Calculation (min)  35 min    Activity Tolerance  Patient tolerated treatment well    Behavior During Therapy  WFL for tasks assessed/performed       Past Medical History:  Diagnosis Date  . Anxiety   . Arthritis    patient reported  . Depression   . Hypertension   . IBS (irritable bowel syndrome)   . 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 APPENDECTOMY    . CESAREAN SECTION    . COLONOSCOPY N/A 05/04/2018   Procedure: COLONOSCOPY;  Surgeon: Daneil Dolin, MD;  [facility];  Service: Endoscopy;  Laterality: N/A;  2:00pm  . PILONIDAL CYST EXCISION    . ROTATOR CUFF REPAIR Right     There were no vitals filed for this visit.  Subjective Assessment - 03/05/20 1310    Currently in Pain?  Yes    Pain Score  1     Pain Location  Shoulder    Pain Orientation  Left    Pain Descriptors / Indicators  Sore    Pain Type  Acute pain         [facility] OT Assessment - 03/05/20 1311      Assessment   Medical Diagnosis  right RTC repair, SAD, DCR      Precautions   Precautions  Shoulder    Type of Shoulder Precautions  See protocol. No abduction for 3 months.     Shoulder Interventions  Shoulder sling/immobilizer;Off for dressing/bathing/exercises;At all times      Observation/Other Assessments   Focus on Therapeutic Outcomes (FOTO)   52/100      ROM / Strength   AROM / PROM / Strength  AROM;PROM;Strength      Palpation   Palpation comment  min fascial restrictions in the left upper arm and trapezius region.       AROM   Overall AROM   Unable to assess;Due to precautions      PROM   Overall PROM Comments  Assessed in supine. IR/er adducted. Two towels for support.     PROM Assessment Site  Shoulder    Right/Left Shoulder  Left    Left Shoulder Flexion  130 Degrees   previous: 46   Left Shoulder ABduction  --   N/T due to protocol.   Left Shoulder Internal Rotation  90 Degrees   previous:  60   Left Shoulder External Rotation  40 Degrees   previous: 0     Strength   Overall Strength  Unable to assess;Due to precautions               OT Treatments/Exercises (OP) - 03/05/20 1323      Exercises   Exercises  Shoulder      Shoulder Exercises: Supine   Protraction  PROM;5 reps;AAROM;10 reps    Horizontal ABduction  --   n/A   External Rotation  PROM;5 reps;AAROM;15 reps    Internal Rotation  PROM;5 reps;AAROM;15 reps    Flexion  PROM;5 reps;AAROM;10 reps    ABduction  --   N/A     Shoulder Exercises: Standing   Protraction  AAROM;10 reps    Horizontal ABduction  --   N/A   External Rotation  AAROM;10 reps    Internal Rotation  AAROM;10 reps    Flexion  AAROM;10 reps    ABduction  --   N/A     Manual Therapy   Manual Therapy  Myofascial release    Manual therapy comments  Manual therapy completed prior to exercises.     Myofascial Release  Myofascial release and manual stretching completed to left upper arm, trapezius, and scapularis  region.              OT Education - 03/05/20 1350    Education Details  AA/ROM supine: flexion, IR/er, protraction supine only    Person(s) Educated  Patient    Methods  Explanation;Demonstration;Verbal cues;Handout    Comprehension  Verbalized understanding;Need further instruction;Returned demonstration       OT Short Term Goals - 03/05/20 1413      OT SHORT TERM GOAL #1   Title  Patient will be educated and independent with HEP in order to increase functional use of her LUE and allow her to begin using it for 50% or more of daily tasks.    Time  6    Period  Weeks    Status  Achieved    Target Date  03/15/20      OT SHORT TERM GOAL #2   Title  Patient will increase LUE (shoulder/elbow)  P/ROM to [facility] in order increase ability to get her shirts on and off with less difficulty.    Time  6    Period  Weeks    Status  On-going      OT SHORT TERM GOAL #3   Title  Patient will decrease fascial restrictions to moderate amount in order to increase functional mobility needed to complete low level reaching tasks.    Time  6    Period  Weeks    Status  Achieved      OT SHORT TERM GOAL #4   Title  Patient will decrease pain level to approximately 5/10 or less when completing basic self care tasks such as bathing.    Time  6    Period  Weeks    Status  On-going      OT SHORT TERM GOAL #5   Title  Patient will  increase her LUE shoulder strength to 3/5 in order to complete household tasks at or below shoulder level.    Time  6    Period  Weeks    Status  On-going        OT Long Term Goals - 02/08/20 1308      OT LONG TERM GOAL #1   Title  Patient  will return to using her LUE for all daily tasks 75% or more of the time while working towards returning to work.    Time  12    Period  Weeks    Status  On-going      OT LONG TERM GOAL #2   Title  Patient will increase her left shoulder and elbow A/ROM to [facility] or better to complete overhead reaching tasks.    Time  12     Period  Weeks    Status  On-going      OT LONG TERM GOAL #3   Title  Patient will increase her left shoulder strength to 4+/5 to increase ability to lift heavier weight while working towards returning to work tasks.    Time  12    Period  Weeks    Status  On-going      OT LONG TERM GOAL #4   Title  Patient will decrease fascial restrictions to min amount or less in order to increase functional mobility needed to complete reaching tasks.    Time  12    Period  Weeks    Status  On-going      OT LONG TERM GOAL #5   Title  Patient will report a decrease in pain of approximately 3/10 or less when completing daily tasks using her LUE.    Time  12    Period  Weeks    Status  On-going            Plan - 03/05/20 1358    Clinical Impression Statement  A: Progressed to phase II. Patient required VC for form and technique with increased pain during decent of movement; such as flexion. Provided updated HEP for flexion, protraction, and IR/er in supine only. Patient tends to push through the pain at the risk of poor form and technique and needs cueing to correct.    Body Structure / Function / Physical Skills  ADL;UE functional use;Fascial restriction;Pain;ROM;Scar mobility;Strength    Plan  P: Continue with phase 2. Work on decreasing pain and discomfort during AA/ROM exercises. Add pulleys and pvc pipe slide.    Consulted and Agree with Plan of Care  Patient       Patient will benefit from skilled therapeutic intervention in order to improve the following deficits and impairments:   Body Structure / Function / Physical Skills: ADL, UE functional use, Fascial restriction, Pain, ROM, Scar mobility, Strength       Visit Diagnosis: Other symptoms and signs involving the musculoskeletal system  Acute pain of left shoulder  Stiffness of left shoulder, not elsewhere classified    Problem List Patient Active Problem List   Diagnosis Date Noted  . Atypical pneumonia 08/14/2018  .  Nausea vomiting and diarrhea 08/14/2018  . Hyponatremia 08/14/2018  . Hypokalemia 08/14/2018  . Reactive thrombocytosis 08/14/2018  . Anxiety 08/14/2018  . Loss of weight 06/15/2018  . Abdominal pain 03/11/2018  . Loose stools 03/11/2018   Ailene Ravel, OTR/L,CBIS  [phone]  03/05/2020, 2:14 PM  [facility] [address] [place], [place], [address] Phone: [phone]   Fax:  [phone]  Name: Dawn Edwards MRN: [id number] Date of Birth: 19-Jan-1966

## 2020-03-07 ENCOUNTER — Telehealth (HOSPITAL_COMMUNITY): Payer: Self-pay

## 2020-03-07 ENCOUNTER — Ambulatory Visit (HOSPITAL_COMMUNITY): Payer: 59

## 2020-03-07 NOTE — Telephone Encounter (Signed)
pt had an emergency and cancelled appt for today

## 2020-03-08 ENCOUNTER — Encounter (HOSPITAL_COMMUNITY): Payer: Self-pay | Admitting: Occupational Therapy

## 2020-03-08 ENCOUNTER — Ambulatory Visit (HOSPITAL_COMMUNITY): Payer: 59 | Admitting: Occupational Therapy

## 2020-03-08 ENCOUNTER — Other Ambulatory Visit: Payer: Self-pay

## 2020-03-08 DIAGNOSIS — R29898 Other symptoms and signs involving the musculoskeletal system: Secondary | ICD-10-CM

## 2020-03-08 DIAGNOSIS — M25512 Pain in left shoulder: Secondary | ICD-10-CM

## 2020-03-08 DIAGNOSIS — M25612 Stiffness of left shoulder, not elsewhere classified: Secondary | ICD-10-CM

## 2020-03-08 NOTE — Therapy (Signed)
[facility] [place], [place], [address] Phone: [phone]   Fax:  [phone]  Occupational Therapy Treatment  Patient Details  Name: Dawn Edwards MRN: [id number] Date of Birth: 02/27/1966 Referring Provider (OT): Esmond Plants, MD   Encounter Date: 03/08/2020  OT End of Session - 03/08/20 1112    Visit Number  7    Number of Visits  20    Date for OT Re-Evaluation  04/26/20   mini reassess:04/02/20   Authorization Type  [employer]    Authorization Time Period  No authorization needed. 90 combined visits PT/OT/SLP. 7 used.    Authorization - Visit Number  7    Authorization - Number of Visits  51    OT Start Time  [phone]    OT Stop Time  1028    OT Time Calculation (min)  40 min    Activity Tolerance  Patient tolerated treatment well    Behavior During Therapy  WFL for tasks assessed/performed       Past Medical History:  Diagnosis Date  . Anxiety   . Arthritis    patient reported  . Depression   . Hypertension   . IBS (irritable bowel syndrome)   . 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 APPENDECTOMY    . CESAREAN SECTION    . COLONOSCOPY N/A 05/04/2018   Procedure: COLONOSCOPY;  Surgeon: Daneil Dolin, MD;  [facility];  Service: Endoscopy;  Laterality: N/A;  2:00pm  . PILONIDAL CYST EXCISION    . ROTATOR CUFF REPAIR Right     There were no vitals filed for this visit.  Subjective Assessment - 03/08/20 0948    Subjective   S: It feels like it's hung up.    Currently in Pain?  Yes    Pain Score  3     Pain Location  Shoulder    Pain Orientation  Left    Pain Descriptors / Indicators  Sore    Pain Type  Acute pain    Pain Radiating Towards  N/A    Pain Onset  Yesterday    Pain Frequency  Intermittent    Aggravating Factors   movement    Pain Relieving Factors  pain medication,  ice, heat    Effect of Pain on Daily Activities  mod effect on ADLs    Multiple Pain Sites  No         [facility] OT Assessment - 03/08/20 0948      Assessment   Medical Diagnosis  right RTC repair, SAD, DCR      Precautions   Precautions  Shoulder    Type of Shoulder Precautions  See protocol. No abduction for 3 months.     Shoulder Interventions  Shoulder sling/immobilizer;Off for dressing/bathing/exercises;At all times               OT Treatments/Exercises (OP) - 03/08/20 1011      Exercises   Exercises  Shoulder      Shoulder Exercises: Supine   Protraction  PROM;5 reps;AAROM;12 reps    Horizontal ABduction  --   N/A   External Rotation  PROM;5 reps;AAROM;15 reps    Internal Rotation  PROM;5 reps;AAROM;15 reps    Flexion  PROM;5 reps;AAROM;12 reps    ABduction  --   N/A     Shoulder Exercises: Standing  Protraction  AAROM;10 reps    Horizontal ABduction  --   N/A   External Rotation  AAROM;10 reps    Internal Rotation  AAROM;10 reps    Flexion  AAROM;10 reps    ABduction  --   N/A     Shoulder Exercises: Pulleys   Flexion  1 minute      Shoulder Exercises: ROM/Strengthening   Other ROM/Strengthening Exercises  pvc pipe slide, 10X flexion      Modalities   Modalities  Ultrasound      Ultrasound   Ultrasound Location  anterior shoulder    Ultrasound Parameters  1.5 w/cm2; 5 min    Ultrasound Goals  Pain      Manual Therapy   Manual Therapy  Myofascial release    Manual therapy comments  Manual therapy completed prior to exercises.     Myofascial Release  Myofascial release and manual stretching completed to left upper arm, trapezius, and scapularis region.                OT Short Term Goals - 03/05/20 1413      OT SHORT TERM GOAL #1   Title  Patient will be educated and independent with HEP in order to increase functional use of her LUE and allow her to begin using it for 50% or more of daily tasks.    Time  6    Period  Weeks     Status  Achieved    Target Date  03/15/20      OT SHORT TERM GOAL #2   Title  Patient will increase LUE (shoulder/elbow)  P/ROM to [facility] in order increase ability to get her shirts on and off with less difficulty.    Time  6    Period  Weeks    Status  On-going      OT SHORT TERM GOAL #3   Title  Patient will decrease fascial restrictions to moderate amount in order to increase functional mobility needed to complete low level reaching tasks.    Time  6    Period  Weeks    Status  Achieved      OT SHORT TERM GOAL #4   Title  Patient will decrease pain level to approximately 5/10 or less when completing basic self care tasks such as bathing.    Time  6    Period  Weeks    Status  On-going      OT SHORT TERM GOAL #5   Title  Patient will  increase her LUE shoulder strength to 3/5 in order to complete household tasks at or below shoulder level.    Time  6    Period  Weeks    Status  On-going        OT Long Term Goals - 02/08/20 1308      OT LONG TERM GOAL #1   Title  Patient will return to using her LUE for all daily tasks 75% or more of the time while working towards returning to work.    Time  12    Period  Weeks    Status  On-going      OT LONG TERM GOAL #2   Title  Patient will increase her left shoulder and elbow A/ROM to [facility] or better to complete overhead reaching tasks.    Time  12    Period  Weeks    Status  On-going      OT LONG TERM GOAL #3  Title  Patient will increase her left shoulder strength to 4+/5 to increase ability to lift heavier weight while working towards returning to work tasks.    Time  12    Period  Weeks    Status  On-going      OT LONG TERM GOAL #4   Title  Patient will decrease fascial restrictions to min amount or less in order to increase functional mobility needed to complete reaching tasks.    Time  12    Period  Weeks    Status  On-going      OT LONG TERM GOAL #5   Title  Patient will report a decrease in pain of approximately 3/10  or less when completing daily tasks using her LUE.    Time  12    Period  Weeks    Status  On-going            Plan - 03/08/20 1014    Clinical Impression Statement  A: Pt reports feeling like her shoulder is hung up today, improved with gentle joint distraction. Continued with manual therapy to address fascial restrictions, and utilized [place] for pain and increased tenderness at anterior shoulder/AC joint region. Continued with AA/ROM in supine, added pulleys and pvc pipe slide. Verbal cuing for form and technique.    Body Structure / Function / Physical Skills  ADL;UE functional use;Fascial restriction;Pain;ROM;Scar mobility;Strength    Plan  P: Continue with phase II, AA/ROM working on form. Attempt wall wash    OT Home Exercise Plan  Eval: pendulums, A/ROM wrist and forearm, P/ROM elbow    Consulted and Agree with Plan of Care  Patient       Patient will benefit from skilled therapeutic intervention in order to improve the following deficits and impairments:   Body Structure / Function / Physical Skills: ADL, UE functional use, Fascial restriction, Pain, ROM, Scar mobility, Strength       Visit Diagnosis: Other symptoms and signs involving the musculoskeletal system  Acute pain of left shoulder  Stiffness of left shoulder, not elsewhere classified    Problem List Patient Active Problem List   Diagnosis Date Noted  . Atypical pneumonia 08/14/2018  . Nausea vomiting and diarrhea 08/14/2018  . Hyponatremia 08/14/2018  . Hypokalemia 08/14/2018  . Reactive thrombocytosis 08/14/2018  . Anxiety 08/14/2018  . Loss of weight 06/15/2018  . Abdominal pain 03/11/2018  . Loose stools 03/11/2018    Dawn Edwards, OTR/L  [phone] 03/08/2020, 11:14 AM  [facility] [place], [place], [address] Phone: [phone]   Fax:  [phone]  Name: Dawn Edwards MRN: [id number] Date of Birth: 02-Dec-1965

## 2020-03-12 ENCOUNTER — Encounter (HOSPITAL_COMMUNITY): Payer: Self-pay

## 2020-03-12 ENCOUNTER — Ambulatory Visit (HOSPITAL_COMMUNITY): Payer: 59 | Attending: Orthopedic Surgery

## 2020-03-12 ENCOUNTER — Other Ambulatory Visit: Payer: Self-pay

## 2020-03-12 DIAGNOSIS — M25512 Pain in left shoulder: Secondary | ICD-10-CM | POA: Insufficient documentation

## 2020-03-12 DIAGNOSIS — R29898 Other symptoms and signs involving the musculoskeletal system: Secondary | ICD-10-CM | POA: Diagnosis present

## 2020-03-12 DIAGNOSIS — M25622 Stiffness of left elbow, not elsewhere classified: Secondary | ICD-10-CM | POA: Insufficient documentation

## 2020-03-12 DIAGNOSIS — M25612 Stiffness of left shoulder, not elsewhere classified: Secondary | ICD-10-CM | POA: Diagnosis present

## 2020-03-12 NOTE — Therapy (Signed)
[facility] [place], [place], [address] Phone: [phone]   Fax:  [phone]  Occupational Therapy Treatment  Patient Details  Name: Dawn Edwards MRN: [id number] Date of Birth: 1966/01/09 Referring Provider (OT): Esmond Plants, MD   Encounter Date: 03/12/2020  OT End of Session - 03/12/20 1346    Visit Number  8    Number of Visits  20    Date for OT Re-Evaluation  04/26/20   mini reassess:04/02/20   Authorization Type  [employer]    Authorization Time Period  No authorization needed. 90 combined visits PT/OT/SLP. 7 used.    Authorization - Visit Number  8    Authorization - Number of Visits  6    OT Start Time  1310   pt checked in late   OT Stop Time  1340    OT Time Calculation (min)  30 min    Activity Tolerance  Patient tolerated treatment well    Behavior During Therapy  WFL for tasks assessed/performed       Past Medical History:  Diagnosis Date  . Anxiety   . Arthritis    patient reported  . Depression   . Hypertension   . IBS (irritable bowel syndrome)   . 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 APPENDECTOMY    . CESAREAN SECTION    . COLONOSCOPY N/A 05/04/2018   Procedure: COLONOSCOPY;  Surgeon: Daneil Dolin, MD;  [facility];  Service: Endoscopy;  Laterality: N/A;  2:00pm  . PILONIDAL CYST EXCISION    . ROTATOR CUFF REPAIR Right     There were no vitals filed for this visit.      [facility] OT Assessment - 03/12/20 1315      Assessment   Medical Diagnosis  Left RTC repair, SAD, DCR      Precautions   Precautions  Shoulder    Type of Shoulder Precautions  See protocol. No abduction for 3 months.     Shoulder Interventions  Shoulder sling/immobilizer;Off for dressing/bathing/exercises;At all times      ROM / Strength   AROM / PROM / Strength  AROM;Strength;PROM       Palpation   Palpation comment  min fascial restrictions in the left upper arm and trapezius region.       AROM   Overall AROM Comments  assessed supine. IR/er adducted    AROM Assessment Site  Shoulder    Right/Left Shoulder  Left    Left Shoulder Flexion  131 Degrees    Left Shoulder Internal Rotation  90 Degrees    Left Shoulder External Rotation  78 Degrees      PROM   Overall PROM Comments  Assessed in supine. IR/er adducted. Two towels for support.     PROM Assessment Site  Shoulder    Right/Left Shoulder  Left    Left Shoulder Flexion  138 Degrees   previous: 130   Left Shoulder ABduction  --   N/T due to protocol   Left Shoulder Internal Rotation  90 Degrees   previous: same   Left Shoulder External Rotation  70 Degrees   previous: 40     Strength   Overall Strength  Unable to assess;Due to precautions               OT Treatments/Exercises (OP) - 03/12/20 1325  Exercises   Exercises  Shoulder      Shoulder Exercises: Supine   Protraction  PROM;5 reps    Horizontal ABduction  --   N/A   External Rotation  PROM;5 reps;AAROM;15 reps    Internal Rotation  PROM;5 reps;AAROM;15 reps    Flexion  PROM;5 reps;AAROM;10 reps    ABduction  --   N/A     Shoulder Exercises: Pulleys   Flexion  1 minute   standing     Shoulder Exercises: ROM/Strengthening   Wall Wash  1'    Other ROM/Strengthening Exercises  pvc pipe slide, 10X flexion               OT Short Term Goals - 03/05/20 1413      OT SHORT TERM GOAL #1   Title  Patient will be educated and independent with HEP in order to increase functional use of her LUE and allow her to begin using it for 50% or more of daily tasks.    Time  6    Period  Weeks    Status  Achieved    Target Date  03/15/20      OT SHORT TERM GOAL #2   Title  Patient will increase LUE (shoulder/elbow)  P/ROM to [facility] in order increase ability to get her shirts on and off with less difficulty.    Time  6    Period   Weeks    Status  On-going      OT SHORT TERM GOAL #3   Title  Patient will decrease fascial restrictions to moderate amount in order to increase functional mobility needed to complete low level reaching tasks.    Time  6    Period  Weeks    Status  Achieved      OT SHORT TERM GOAL #4   Title  Patient will decrease pain level to approximately 5/10 or less when completing basic self care tasks such as bathing.    Time  6    Period  Weeks    Status  On-going      OT SHORT TERM GOAL #5   Title  Patient will  increase her LUE shoulder strength to 3/5 in order to complete household tasks at or below shoulder level.    Time  6    Period  Weeks    Status  On-going        OT Long Term Goals - 02/08/20 1308      OT LONG TERM GOAL #1   Title  Patient will return to using her LUE for all daily tasks 75% or more of the time while working towards returning to work.    Time  12    Period  Weeks    Status  On-going      OT LONG TERM GOAL #2   Title  Patient will increase her left shoulder and elbow A/ROM to [facility] or better to complete overhead reaching tasks.    Time  12    Period  Weeks    Status  On-going      OT LONG TERM GOAL #3   Title  Patient will increase her left shoulder strength to 4+/5 to increase ability to lift heavier weight while working towards returning to work tasks.    Time  12    Period  Weeks    Status  On-going      OT LONG TERM GOAL #4   Title  Patient will decrease  fascial restrictions to min amount or less in order to increase functional mobility needed to complete reaching tasks.    Time  12    Period  Weeks    Status  On-going      OT LONG TERM GOAL #5   Title  Patient will report a decrease in pain of approximately 3/10 or less when completing daily tasks using her LUE.    Time  12    Period  Weeks    Status  On-going            Plan - 03/12/20 1347    Clinical Impression Statement  A: Measurements taken for MD appointment tomorrow. Pt  reports discomfort in posterior shoulder region at superior lateral border of scapula. VC for form and technique were provided as needed. Added wall wash to further work on shoulder strengthening.    Body Structure / Function / Physical Skills  ADL;UE functional use;Fascial restriction;Pain;ROM;Scar mobility;Strength    Plan  P: Follow up on MD appointment. Continue with phase II.    Consulted and Agree with Plan of Care  Patient       Patient will benefit from skilled therapeutic intervention in order to improve the following deficits and impairments:   Body Structure / Function / Physical Skills: ADL, UE functional use, Fascial restriction, Pain, ROM, Scar mobility, Strength       Visit Diagnosis: Other symptoms and signs involving the musculoskeletal system  Acute pain of left shoulder  Stiffness of left shoulder, not elsewhere classified    Problem List Patient Active Problem List   Diagnosis Date Noted  . Atypical pneumonia 08/14/2018  . Nausea vomiting and diarrhea 08/14/2018  . Hyponatremia 08/14/2018  . Hypokalemia 08/14/2018  . Reactive thrombocytosis 08/14/2018  . Anxiety 08/14/2018  . Loss of weight 06/15/2018  . Abdominal pain 03/11/2018  . Loose stools 03/11/2018   Ailene Ravel, OTR/L,CBIS  [phone]  03/12/2020, 1:49 PM  [facility] [address] [place], [place], [address] Phone: [phone]   Fax:  [phone]  Name: SHEELAH SANTULLI MRN: [id number] Date of Birth: November 12, 1965

## 2020-03-14 ENCOUNTER — Other Ambulatory Visit: Payer: Self-pay

## 2020-03-14 ENCOUNTER — Ambulatory Visit (HOSPITAL_COMMUNITY): Payer: 59

## 2020-03-14 ENCOUNTER — Encounter (HOSPITAL_COMMUNITY): Payer: Self-pay

## 2020-03-14 DIAGNOSIS — M25612 Stiffness of left shoulder, not elsewhere classified: Secondary | ICD-10-CM

## 2020-03-14 DIAGNOSIS — R29898 Other symptoms and signs involving the musculoskeletal system: Secondary | ICD-10-CM

## 2020-03-14 DIAGNOSIS — M25512 Pain in left shoulder: Secondary | ICD-10-CM

## 2020-03-14 NOTE — Therapy (Signed)
[place], [place], [address] Phone: [phone]   Fax:  [phone]  Occupational Therapy Treatment  Patient Details  Name: Dawn Edwards MRN: [id number] Date of Birth: [birth year] Referring Provider (OT): Esmond Plants, MD   Encounter Date: 03/14/2020  OT End of Session - 03/14/20 1626    Visit Number  9    Number of Visits  20    Date for OT Re-Evaluation  04/26/20   mini reassess:04/02/20   Authorization Type  [employer]    Authorization Time Period  No authorization needed. 90 combined visits PT/OT/SLP. 7 used.    Authorization - Visit Number  9    Authorization - Number of Visits  95    OT Start Time  [id number]   pt arrived late   OT Stop Time  1340    OT Time Calculation (min)  31 min    Activity Tolerance  Patient tolerated treatment well    Behavior During Therapy  WFL for tasks assessed/performed       Past Medical History:  Diagnosis Date  . Anxiety   . Arthritis    patient reported  . Depression   . Hypertension   . IBS (irritable bowel syndrome)   . 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 APPENDECTOMY    . CESAREAN SECTION    . COLONOSCOPY N/A 05/04/2018   Procedure: COLONOSCOPY;  Surgeon: Daneil Dolin, MD;  [facility];  Service: Endoscopy;  Laterality: N/A;  2:00pm  . PILONIDAL CYST EXCISION    . ROTATOR CUFF REPAIR Right     There were no vitals filed for this visit.  Subjective Assessment - 03/14/20 1329    Subjective   S: I go back to see Dr. Barbee Shropshire (6 weeks.) He said I can start doing weight.    Currently in Pain?  Yes    Pain Score  3     Pain Location  Shoulder    Pain Orientation  Left    Pain Descriptors / Indicators  Sore    Pain Type  Acute pain         [facility] OT Assessment - 03/14/20 1330      Assessment   Medical Diagnosis  Left RTC  repair, SAD, DCR    Next MD Visit  04/24/20      Precautions   Precautions  Shoulder    Type of Shoulder Precautions  MD ok'd weights when able.                OT Treatments/Exercises (OP) - 03/14/20 1331      Exercises   Exercises  Shoulder      Shoulder Exercises: Supine   Protraction  PROM;5 reps;AROM;10 reps    Horizontal ABduction  PROM;5 reps    External Rotation  PROM;5 reps;AROM;10 reps    Internal Rotation  PROM;5 reps;AROM;10 reps    Flexion  PROM;5 reps;AROM;10 reps    ABduction  PROM;5 reps      Shoulder Exercises: Standing   Protraction  AROM;10 reps    Horizontal ABduction  --   unable   External Rotation  AROM;10 reps    Internal Rotation  AROM;10 reps    Flexion  AROM;10 reps    ABduction  AROM;10 reps;Limitations    ABduction Limitations  to shoulder level  Shoulder Exercises: ROM/Strengthening   Proximal Shoulder Strengthening, Supine  10X A/ROM no rest breaks      Manual Therapy   Manual Therapy  Myofascial release    Manual therapy comments  Manual therapy completed prior to exercises.     Myofascial Release  Myofascial release and manual stretching completed to left upper arm, trapezius, and scapularis region.              OT Education - 03/14/20 1334    Education Details  A/ROM shoulder exercises.    Person(s) Educated  Patient    Methods  Explanation;Demonstration;Verbal cues;Handout    Comprehension  Verbalized understanding;Need further instruction;Returned demonstration       OT Short Term Goals - 03/14/20 1631      OT SHORT TERM GOAL #1   Title  Patient will be educated and independent with HEP in order to increase functional use of her LUE and allow her to begin using it for 50% or more of daily tasks.    Time  6    Period  Weeks    Target Date  03/15/20      OT SHORT TERM GOAL #2   Title  Patient will increase LUE (shoulder/elbow)  P/ROM to [facility] in order increase ability to get her shirts on and off with less  difficulty.    Time  6    Period  Weeks    Status  On-going      OT SHORT TERM GOAL #3   Title  Patient will decrease fascial restrictions to moderate amount in order to increase functional mobility needed to complete low level reaching tasks.    Time  6    Period  Weeks      OT SHORT TERM GOAL #4   Title  Patient will decrease pain level to approximately 5/10 or less when completing basic self care tasks such as bathing.    Time  6    Period  Weeks    Status  On-going      OT SHORT TERM GOAL #5   Title  Patient will  increase her LUE shoulder strength to 3/5 in order to complete household tasks at or below shoulder level.    Time  6    Period  Weeks    Status  On-going        OT Long Term Goals - 02/08/20 1308      OT LONG TERM GOAL #1   Title  Patient will return to using her LUE for all daily tasks 75% or more of the time while working towards returning to work.    Time  12    Period  Weeks    Status  On-going      OT LONG TERM GOAL #2   Title  Patient will increase her left shoulder and elbow A/ROM to [facility] or better to complete overhead reaching tasks.    Time  12    Period  Weeks    Status  On-going      OT LONG TERM GOAL #3   Title  Patient will increase her left shoulder strength to 4+/5 to increase ability to lift heavier weight while working towards returning to work tasks.    Time  12    Period  Weeks    Status  On-going      OT LONG TERM GOAL #4   Title  Patient will decrease fascial restrictions to min amount or less in order to increase  functional mobility needed to complete reaching tasks.    Time  12    Period  Weeks    Status  On-going      OT LONG TERM GOAL #5   Title  Patient will report a decrease in pain of approximately 3/10 or less when completing daily tasks using her LUE.    Time  12    Period  Weeks    Status  On-going            Plan - 03/14/20 1629    Clinical Impression Statement  A: Patient reports that Dr. Veverly Fells said she  is doing great and to continue therapy. She is cleared to use weights. Since we have only been completing AA/ROM for the past two session, we progressed to A/ROM. Omitted horizontal abduction due to pain. And abduction was completed to shoulder level standing only. HEP was updated to reflect new progression. VC for form and technique. Manual techniques completed to address fascial restrictions.    Body Structure / Function / Physical Skills  ADL;UE functional use;Fascial restriction;Pain;ROM;Scar mobility;Strength    Plan  P: Follow up on HEP. Continue with A/ROM. Complete scapular strengthening and work on functional reaching at a low level.    Consulted and Agree with Plan of Care  Patient       Patient will benefit from skilled therapeutic intervention in order to improve the following deficits and impairments:   Body Structure / Function / Physical Skills: ADL, UE functional use, Fascial restriction, Pain, ROM, Scar mobility, Strength       Visit Diagnosis: Acute pain of left shoulder  Stiffness of left shoulder, not elsewhere classified  Other symptoms and signs involving the musculoskeletal system    Problem List Patient Active Problem List   Diagnosis Date Noted  . Atypical pneumonia 08/14/2018  . Nausea vomiting and diarrhea 08/14/2018  . Hyponatremia 08/14/2018  . Hypokalemia 08/14/2018  . Reactive thrombocytosis 08/14/2018  . Anxiety 08/14/2018  . Loss of weight 06/15/2018  . Abdominal pain 03/11/2018  . Loose stools 03/11/2018   Ailene Ravel, OTR/L,CBIS  [phone]  03/14/2020, 4:32 PM  [facility] [place] [address] [place], [place], [address] Phone: [phone]   Fax:  [phone]  Name: Dawn Edwards MRN: [id number] Date of Birth: 09/06/1966

## 2020-03-14 NOTE — Patient Instructions (Addendum)
Repeat all exercises 10-15 times, 1-2 times per day.  1) Shoulder Protraction    Begin with elbows by your side, slowly "punch" straight out in front of you.      2) Shoulder Flexion  Supine:     Standing:         Begin with arms at your side with thumbs pointed up, slowly raise both arms up and forward towards overhead.               4) Internal & External Rotation   Supine:     Standing:     Stand with elbows at the side and elbows bent 90 degrees. Move your forearms away from your body, then bring back inward toward the body.     5) Shoulder Abduction  Supine:     Standing:       Lying on your back begin with your arms flat on the table next to your side. Slowly move your arms out to the side so that they go overhead, in a jumping jack or snow angel movement.

## 2020-03-15 ENCOUNTER — Telehealth (HOSPITAL_COMMUNITY): Payer: Self-pay

## 2020-03-15 NOTE — Telephone Encounter (Signed)
pt cancelled appt for 05/10 because she has an orthodontist appt

## 2020-03-19 ENCOUNTER — Ambulatory Visit (HOSPITAL_COMMUNITY): Payer: 59

## 2020-03-21 ENCOUNTER — Ambulatory Visit (HOSPITAL_COMMUNITY): Payer: 59

## 2020-03-21 ENCOUNTER — Other Ambulatory Visit: Payer: Self-pay

## 2020-03-21 ENCOUNTER — Encounter (HOSPITAL_COMMUNITY): Payer: Self-pay

## 2020-03-21 DIAGNOSIS — R29898 Other symptoms and signs involving the musculoskeletal system: Secondary | ICD-10-CM

## 2020-03-21 DIAGNOSIS — M25612 Stiffness of left shoulder, not elsewhere classified: Secondary | ICD-10-CM

## 2020-03-21 DIAGNOSIS — M25512 Pain in left shoulder: Secondary | ICD-10-CM

## 2020-03-21 NOTE — Therapy (Signed)
[place], [place], [address] Phone: [phone]   Fax:  [phone]  Occupational Therapy Treatment  Patient Details  Name: Dawn Edwards MRN: [id number] Date of Birth: 08-20-1966 Referring Provider (OT): Esmond Plants, MD   Encounter Date: 03/21/2020  OT End of Session - 03/21/20 1500    Visit Number  10    Number of Visits  20    Date for OT Re-Evaluation  04/26/20   mini reassess:04/02/20   Authorization Type  [employer]    Authorization Time Period  No authorization needed. 90 combined visits PT/OT/SLP. 7 used.    Authorization - Visit Number  10    Authorization - Number of Visits  10    OT Start Time  [id number]   Pt arrived late   OT Stop Time  1427    OT Time Calculation (min)  39 min    Activity Tolerance  Patient tolerated treatment well    Behavior During Therapy  WFL for tasks assessed/performed       Past Medical History:  Diagnosis Date  . Anxiety   . Arthritis    patient reported  . Depression   . Hypertension   . IBS (irritable bowel syndrome)   . 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 APPENDECTOMY    . CESAREAN SECTION    . COLONOSCOPY N/A 05/04/2018   Procedure: COLONOSCOPY;  Surgeon: Daneil Dolin, MD;  [facility];  Service: Endoscopy;  Laterality: N/A;  2:00pm  . PILONIDAL CYST EXCISION    . ROTATOR CUFF REPAIR Right     There were no vitals filed for this visit.  Subjective Assessment - 03/21/20 1408    Subjective   S: My hip has really been bothering so I haven't been paying attention to my shoulder.    Currently in Pain?  Yes   left hip. no number provided.                  OT Treatments/Exercises (OP) - 03/21/20 1411      Exercises   Exercises  Shoulder      Shoulder Exercises: Supine   Protraction  PROM;5 reps;Strengthening;10 reps     Protraction Weight (lbs)  1    Horizontal ABduction  PROM;5 reps   8X with 1#, 2X A/ROM   External Rotation  PROM;5 reps;Strengthening;10 reps    External Rotation Weight (lbs)  1    Internal Rotation  PROM;5 reps;Strengthening;10 reps    Internal Rotation Weight (lbs)  1    Flexion  PROM;5 reps;Strengthening;10 reps    Shoulder Flexion Weight (lbs)  1      Shoulder Exercises: Standing   Protraction  AROM;10 reps    Horizontal ABduction  AROM;10 reps    External Rotation  AROM;10 reps    Internal Rotation  AROM;10 reps    Flexion  AROM;10 reps    ABduction  AROM;10 reps      Shoulder Exercises: ROM/Strengthening   Proximal Shoulder Strengthening, Supine  10X A/ROM no rest breaks      Functional Reaching Activities   High Level  High level reaching completed while placing 10 cones individually then removed in the same fashion.       Modalities   Modalities  --  OT Short Term Goals - 03/14/20 1631      OT SHORT TERM GOAL #1   Title  Patient will be educated and independent with HEP in order to increase functional use of her LUE and allow her to begin using it for 50% or more of daily tasks.    Time  6    Period  Weeks    Target Date  03/15/20      OT SHORT TERM GOAL #2   Title  Patient will increase LUE (shoulder/elbow)  P/ROM to [facility] in order increase ability to get her shirts on and off with less difficulty.    Time  6    Period  Weeks    Status  On-going      OT SHORT TERM GOAL #3   Title  Patient will decrease fascial restrictions to moderate amount in order to increase functional mobility needed to complete low level reaching tasks.    Time  6    Period  Weeks      OT SHORT TERM GOAL #4   Title  Patient will decrease pain level to approximately 5/10 or less when completing basic self care tasks such as bathing.    Time  6    Period  Weeks    Status  On-going      OT SHORT TERM GOAL #5   Title  Patient will  increase her LUE shoulder  strength to 3/5 in order to complete household tasks at or below shoulder level.    Time  6    Period  Weeks    Status  On-going        OT Long Term Goals - 02/08/20 1308      OT LONG TERM GOAL #1   Title  Patient will return to using her LUE for all daily tasks 75% or more of the time while working towards returning to work.    Time  12    Period  Weeks    Status  On-going      OT LONG TERM GOAL #2   Title  Patient will increase her left shoulder and elbow A/ROM to [facility] or better to complete overhead reaching tasks.    Time  12    Period  Weeks    Status  On-going      OT LONG TERM GOAL #3   Title  Patient will increase her left shoulder strength to 4+/5 to increase ability to lift heavier weight while working towards returning to work tasks.    Time  12    Period  Weeks    Status  On-going      OT LONG TERM GOAL #4   Title  Patient will decrease fascial restrictions to min amount or less in order to increase functional mobility needed to complete reaching tasks.    Time  12    Period  Weeks    Status  On-going      OT LONG TERM GOAL #5   Title  Patient will report a decrease in pain of approximately 3/10 or less when completing daily tasks using her LUE.    Time  12    Period  Weeks    Status  On-going            Plan - 03/21/20 1501    Clinical Impression Statement  A: Patient was able to progress to some strengthening exercises supine using the 1#. Demonstrating functional and high level reaching abilities during  activities. VC for form and technique were provided. Discomfort experienced during horizontal abduction and patient was uable to complete all requested repetitions.    Body Structure / Function / Physical Skills  ADL;UE functional use;Fascial restriction;Pain;ROM;Scar mobility;Strength    Plan  P: Continue with strengthening as able with low weight. scapular strengthening exercises.    Consulted and Agree with Plan of Care  Patient       Patient will  benefit from skilled therapeutic intervention in order to improve the following deficits and impairments:   Body Structure / Function / Physical Skills: ADL, UE functional use, Fascial restriction, Pain, ROM, Scar mobility, Strength       Visit Diagnosis: Acute pain of left shoulder  Stiffness of left shoulder, not elsewhere classified  Other symptoms and signs involving the musculoskeletal system    Problem List Patient Active Problem List   Diagnosis Date Noted  . Atypical pneumonia 08/14/2018  . Nausea vomiting and diarrhea 08/14/2018  . Hyponatremia 08/14/2018  . Hypokalemia 08/14/2018  . Reactive thrombocytosis 08/14/2018  . Anxiety 08/14/2018  . Loss of weight 06/15/2018  . Abdominal pain 03/11/2018  . Loose stools 03/11/2018   Ailene Ravel, OTR/L,CBIS  [phone]  03/21/2020, 3:06 PM  [facility] [address] [place], [place], [address] Phone: [phone]   Fax:  [phone]  Name: Dawn Edwards MRN: [id number] Date of Birth: 04-04-1966

## 2020-03-26 ENCOUNTER — Other Ambulatory Visit: Payer: Self-pay

## 2020-03-26 ENCOUNTER — Ambulatory Visit (HOSPITAL_COMMUNITY): Payer: 59 | Admitting: Specialist

## 2020-03-26 ENCOUNTER — Encounter (HOSPITAL_COMMUNITY): Payer: Self-pay | Admitting: Specialist

## 2020-03-26 DIAGNOSIS — M25612 Stiffness of left shoulder, not elsewhere classified: Secondary | ICD-10-CM

## 2020-03-26 DIAGNOSIS — R29898 Other symptoms and signs involving the musculoskeletal system: Secondary | ICD-10-CM | POA: Diagnosis not present

## 2020-03-26 NOTE — Therapy (Signed)
[place], [place], [address] Phone: [phone]   Fax:  [phone]  Occupational Therapy Treatment  Patient Details  Name: Dawn Edwards MRN: [id number] Date of Birth: November 05, 1966 Referring Provider (OT): Esmond Plants, MD   Encounter Date: 03/26/2020  OT End of Session - 03/26/20 [id number]    Visit Number  11    Number of Visits  20    Date for OT Re-Evaluation  04/26/20   mini reassess on 04/02/20   Authorization Type  [employer]    Authorization Time Period  No authorization needed. 90 combined visits PT/OT/SLP. 7 used.    Authorization - Visit Number  11    Authorization - Number of Visits  38    OT Start Time  [id number]    OT Stop Time  1345    OT Time Calculation (min)  40 min    Activity Tolerance  Patient tolerated treatment well    Behavior During Therapy  WFL for tasks assessed/performed       Past Medical History:  Diagnosis Date  . Anxiety   . Arthritis    patient reported  . Depression   . Hypertension   . IBS (irritable bowel syndrome)   . 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 APPENDECTOMY    . CESAREAN SECTION    . COLONOSCOPY N/A 05/04/2018   Procedure: COLONOSCOPY;  Surgeon: Daneil Dolin, MD;  [facility];  Service: Endoscopy;  Laterality: N/A;  2:00pm  . PILONIDAL CYST EXCISION    . ROTATOR CUFF REPAIR Right     There were no vitals filed for this visit.  Subjective Assessment - 03/26/20 1330    Subjective   S:  it helps if I visualize pushing against a wall, it stabalizes the joint and helps the pain.    Currently in Pain?  Yes    Pain Score  4     Pain Orientation  Left    Pain Descriptors / Indicators  Aching;Sore    Pain Type  Acute pain         [facility] OT Assessment - 03/26/20 0001      Assessment   Medical Diagnosis  Left RTC repair, SAD, DCR      Precautions   Precautions  Shoulder    Type of Shoulder Precautions  MD ok'd weights when able.                OT Treatments/Exercises (OP) - 03/26/20 0001      Shoulder Exercises: Supine   Protraction  PROM;5 reps;Strengthening;12 reps    Protraction Weight (lbs)  1    Horizontal ABduction  PROM;5 reps;Strengthening;12 reps    Horizontal ABduction Weight (lbs)  1    External Rotation  PROM;5 reps;Strengthening;12 reps    External Rotation Weight (lbs)  1    Internal Rotation  PROM;5 reps;Strengthening;12 reps    Internal Rotation Weight (lbs)  1    Flexion  PROM;5 reps;Strengthening;12 reps    Shoulder Flexion Weight (lbs)  1    ABduction  PROM;5 reps;Strengthening;12 reps    Shoulder ABduction Weight (lbs)  1      Shoulder Exercises: Standing   Extension  AROM;12 reps    Row  AROM;10 reps    Retraction  AROM;10 reps    Shoulder Elevation  AROM;10 reps  Other Standing Exercises  standing holding large green therapy ball completed 10 repetiitions of chest press, overhead press, and shoulder flexion 10 times.        Shoulder Exercises: ROM/Strengthening   Proximal Shoulder Strengthening, Supine  10X A/ROM no rest breaks      Manual Therapy   Manual Therapy  Myofascial release    Manual therapy comments  Manual therapy completed prior to exercises.     Myofascial Release  Myofascial release and manual stretching completed to left upper arm, trapezius, and scapularis region.                OT Short Term Goals - 03/14/20 1631      OT SHORT TERM GOAL #1   Title  Patient will be educated and independent with HEP in order to increase functional use of her LUE and allow her to begin using it for 50% or more of daily tasks.    Time  6    Period  Weeks    Target Date  03/15/20      OT SHORT TERM GOAL #2   Title  Patient will increase LUE (shoulder/elbow)  P/ROM to [facility] in order increase ability to get her shirts on and off with less difficulty.    Time  6     Period  Weeks    Status  On-going      OT SHORT TERM GOAL #3   Title  Patient will decrease fascial restrictions to moderate amount in order to increase functional mobility needed to complete low level reaching tasks.    Time  6    Period  Weeks      OT SHORT TERM GOAL #4   Title  Patient will decrease pain level to approximately 5/10 or less when completing basic self care tasks such as bathing.    Time  6    Period  Weeks    Status  On-going      OT SHORT TERM GOAL #5   Title  Patient will  increase her LUE shoulder strength to 3/5 in order to complete household tasks at or below shoulder level.    Time  6    Period  Weeks    Status  On-going        OT Long Term Goals - 02/08/20 1308      OT LONG TERM GOAL #1   Title  Patient will return to using her LUE for all daily tasks 75% or more of the time while working towards returning to work.    Time  12    Period  Weeks    Status  On-going      OT LONG TERM GOAL #2   Title  Patient will increase her left shoulder and elbow A/ROM to [facility] or better to complete overhead reaching tasks.    Time  12    Period  Weeks    Status  On-going      OT LONG TERM GOAL #3   Title  Patient will increase her left shoulder strength to 4+/5 to increase ability to lift heavier weight while working towards returning to work tasks.    Time  12    Period  Weeks    Status  On-going      OT LONG TERM GOAL #4   Title  Patient will decrease fascial restrictions to min amount or less in order to increase functional mobility needed to complete reaching tasks.    Time  12    Period  Weeks    Status  On-going      OT LONG TERM GOAL #5   Title  Patient will report a decrease in pain of approximately 3/10 or less when completing daily tasks using her LUE.    Time  12    Period  Weeks    Status  On-going            Plan - 03/26/20 1633    Clinical Impression Statement  A:  Patient stated that on previous session she felt like her arm was  falling during supine strengthening.  During this session patient encouraged to use isometric contraction while completing strengthening for improved control.  Patient had great results with this technique.  Continues to require verbal guidance for proper technique.    Body Structure / Function / Physical Skills  ADL;UE functional use;Fascial restriction;Pain;ROM;Scar mobility;Strength    Plan  P:  Continue to improve strength for functional reaching, improve a/rom of internal and external rotation.  improve technique in order to require less vg during therapeutic exercises.       Patient will benefit from skilled therapeutic intervention in order to improve the following deficits and impairments:   Body Structure / Function / Physical Skills: ADL, UE functional use, Fascial restriction, Pain, ROM, Scar mobility, Strength       Visit Diagnosis: Stiffness of left shoulder, not elsewhere classified  Other symptoms and signs involving the musculoskeletal system    Problem List Patient Active Problem List   Diagnosis Date Noted  . Atypical pneumonia 08/14/2018  . Nausea vomiting and diarrhea 08/14/2018  . Hyponatremia 08/14/2018  . Hypokalemia 08/14/2018  . Reactive thrombocytosis 08/14/2018  . Anxiety 08/14/2018  . Loss of weight 06/15/2018  . Abdominal pain 03/11/2018  . Loose stools 03/11/2018    Vangie Bicker, [place], OTR/L [phone]  03/26/2020, 4:38 PM  [place], [place], [address] Phone: [phone]   Fax:  [phone]  Name: Dawn Edwards MRN: [id number] Date of Birth: 1966/03/29

## 2020-03-28 ENCOUNTER — Other Ambulatory Visit: Payer: Self-pay

## 2020-03-28 ENCOUNTER — Ambulatory Visit (HOSPITAL_COMMUNITY): Payer: 59 | Admitting: Specialist

## 2020-03-28 ENCOUNTER — Encounter (HOSPITAL_COMMUNITY): Payer: Self-pay | Admitting: Specialist

## 2020-03-28 DIAGNOSIS — M25612 Stiffness of left shoulder, not elsewhere classified: Secondary | ICD-10-CM

## 2020-03-28 DIAGNOSIS — R29898 Other symptoms and signs involving the musculoskeletal system: Secondary | ICD-10-CM

## 2020-03-28 DIAGNOSIS — M25622 Stiffness of left elbow, not elsewhere classified: Secondary | ICD-10-CM

## 2020-03-28 DIAGNOSIS — M25512 Pain in left shoulder: Secondary | ICD-10-CM

## 2020-03-28 NOTE — Therapy (Signed)
[place], [place], [address] Phone: [phone]   Fax:  [phone]  Occupational Therapy Treatment  Patient Details  Name: Dawn Edwards MRN: [id number] Date of Birth: 09/25/1966 Referring Provider (OT): Esmond Plants, MD   Encounter Date: 03/28/2020  OT End of Session - 03/28/20 1612    Visit Number  12    Number of Visits  20    Date for OT Re-Evaluation  04/26/20   mini reassess on 5/24   Authorization Type  [place] Time Period  No authorization needed. 90 combined visits PT/OT/SLP. 7 used.    Authorization - Visit Number  12    Authorization - Number of Visits  71    OT Start Time  [id number]    OT Stop Time  1345    OT Time Calculation (min)  40 min       Past Medical History:  Diagnosis Date  . Anxiety   . Arthritis    patient reported  . Depression   . Hypertension   . IBS (irritable bowel syndrome)   . 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 APPENDECTOMY    . CESAREAN SECTION    . COLONOSCOPY N/A 05/04/2018   Procedure: COLONOSCOPY;  Surgeon: Daneil Dolin, MD;  [facility];  Service: Endoscopy;  Laterality: N/A;  2:00pm  . PILONIDAL CYST EXCISION    . ROTATOR CUFF REPAIR Right     There were no vitals filed for this visit.  Subjective Assessment - 03/28/20 1611    Subjective   S:  My shoulder was really tired after our last visit.  I had to go home and rest.    Currently in Pain?  Yes    Pain Score  4     Pain Orientation  Left    Pain Type  Acute pain         [facility] OT Assessment - 03/28/20 0001      Assessment   Medical Diagnosis  Left RTC repair, SAD, DCR    Referring Provider (OT)  Esmond Plants, MD      Precautions   Precautions  Shoulder    Type of Shoulder Precautions  MD ok'd weights when able.                OT  Treatments/Exercises (OP) - 03/28/20 0001      Shoulder Exercises: Supine   Protraction  PROM;5 reps;Strengthening;12 reps    Protraction Weight (lbs)  1    Horizontal ABduction  PROM;5 reps;Strengthening;12 reps    Horizontal ABduction Weight (lbs)  1    External Rotation  PROM;5 reps;Strengthening;15 reps    External Rotation Weight (lbs)  1    Internal Rotation  PROM;5 reps;Strengthening;15 reps    Internal Rotation Weight (lbs)  1    Flexion  PROM;5 reps;Strengthening;15 reps    Shoulder Flexion Weight (lbs)  1    ABduction  PROM;5 reps;Strengthening;15 reps    Shoulder ABduction Weight (lbs)  1      Shoulder Exercises: Standing   Other Standing Exercises  passed basketball behind back 10 times right to left and then 10 additional times opposite direction     Other Standing Exercises  standing holding large green therapy ball completed 10 repetiitions of chest press, overhead press, and shoulder flexion, shoulder external rotation  10 times.        Shoulder Exercises: Stretch   Other Shoulder Stretches  standing holding right and left hands at back of waist band slid arm into as much internal rotation as patient could tolerate and held for 10" x 5 reps and then completed with just left hand 5 additional times       Manual Therapy   Manual Therapy  Myofascial release    Manual therapy comments  Manual therapy completed prior to exercises.     Myofascial Release  Myofascial release and manual stretching completed to left upper arm, trapezius, and scapularis region.                OT Short Term Goals - 03/14/20 1631      OT SHORT TERM GOAL #1   Title  Patient will be educated and independent with HEP in order to increase functional use of her LUE and allow her to begin using it for 50% or more of daily tasks.    Time  6    Period  Weeks    Target Date  03/15/20      OT SHORT TERM GOAL #2   Title  Patient will increase LUE (shoulder/elbow)  P/ROM to [facility] in order increase  ability to get her shirts on and off with less difficulty.    Time  6    Period  Weeks    Status  On-going      OT SHORT TERM GOAL #3   Title  Patient will decrease fascial restrictions to moderate amount in order to increase functional mobility needed to complete low level reaching tasks.    Time  6    Period  Weeks      OT SHORT TERM GOAL #4   Title  Patient will decrease pain level to approximately 5/10 or less when completing basic self care tasks such as bathing.    Time  6    Period  Weeks    Status  On-going      OT SHORT TERM GOAL #5   Title  Patient will  increase her LUE shoulder strength to 3/5 in order to complete household tasks at or below shoulder level.    Time  6    Period  Weeks    Status  On-going        OT Long Term Goals - 02/08/20 1308      OT LONG TERM GOAL #1   Title  Patient will return to using her LUE for all daily tasks 75% or more of the time while working towards returning to work.    Time  12    Period  Weeks    Status  On-going      OT LONG TERM GOAL #2   Title  Patient will increase her left shoulder and elbow A/ROM to [facility] or better to complete overhead reaching tasks.    Time  12    Period  Weeks    Status  On-going      OT LONG TERM GOAL #3   Title  Patient will increase her left shoulder strength to 4+/5 to increase ability to lift heavier weight while working towards returning to work tasks.    Time  12    Period  Weeks    Status  On-going      OT LONG TERM GOAL #4   Title  Patient will decrease fascial restrictions to min amount or less in order to  increase functional mobility needed to complete reaching tasks.    Time  12    Period  Weeks    Status  On-going      OT LONG TERM GOAL #5   Title  Patient will report a decrease in pain of approximately 3/10 or less when completing daily tasks using her LUE.    Time  12    Period  Weeks    Status  On-going            Plan - 03/28/20 1612    Clinical Impression Statement   A:  Patient conitnued to use cocontraction/isometric contraction with shoulder strengthening with good results and improved form.  added external rotation while holding large therapy ball and ball pass behind back for improved functional internal and external rotation.    Body Structure / Function / Physical Skills  ADL;UE functional use;Fascial restriction;Pain;ROM;Scar mobility;Strength    Plan  P:  Attempt 1# resistance in standing, add tband proximal shoulder stability exercises for improved sustained activity tolerance overhead.       Patient will benefit from skilled therapeutic intervention in order to improve the following deficits and impairments:   Body Structure / Function / Physical Skills: ADL, UE functional use, Fascial restriction, Pain, ROM, Scar mobility, Strength       Visit Diagnosis: Stiffness of left shoulder, not elsewhere classified  Other symptoms and signs involving the musculoskeletal system  Acute pain of left shoulder  Stiffness of left elbow, not elsewhere classified    Problem List Patient Active Problem List   Diagnosis Date Noted  . Atypical pneumonia 08/14/2018  . Nausea vomiting and diarrhea 08/14/2018  . Hyponatremia 08/14/2018  . Hypokalemia 08/14/2018  . Reactive thrombocytosis 08/14/2018  . Anxiety 08/14/2018  . Loss of weight 06/15/2018  . Abdominal pain 03/11/2018  . Loose stools 03/11/2018    Vangie Bicker, [place], OTR/L [phone]  03/28/2020, 4:18 PM  [place] [address] [place], [place], [address] Phone: [phone]   Fax:  [phone]  Name: Dawn Edwards MRN: [id number] Date of Birth: 03/25/1966

## 2020-04-02 ENCOUNTER — Encounter (HOSPITAL_COMMUNITY): Payer: Self-pay | Admitting: Occupational Therapy

## 2020-04-02 ENCOUNTER — Ambulatory Visit (HOSPITAL_COMMUNITY): Payer: 59 | Admitting: Occupational Therapy

## 2020-04-02 ENCOUNTER — Other Ambulatory Visit: Payer: Self-pay

## 2020-04-02 DIAGNOSIS — R29898 Other symptoms and signs involving the musculoskeletal system: Secondary | ICD-10-CM | POA: Diagnosis not present

## 2020-04-02 DIAGNOSIS — M25622 Stiffness of left elbow, not elsewhere classified: Secondary | ICD-10-CM

## 2020-04-02 DIAGNOSIS — M25612 Stiffness of left shoulder, not elsewhere classified: Secondary | ICD-10-CM

## 2020-04-02 DIAGNOSIS — M25512 Pain in left shoulder: Secondary | ICD-10-CM

## 2020-04-02 NOTE — Therapy (Addendum)
[place], [place], [address] Phone: [phone]   Fax:  [phone]  Occupational Therapy Treatment  Patient Details  Name: Dawn Edwards MRN: [id number] Date of Birth: 05-14-1966 Referring Provider (OT): Esmond Plants, MD   Encounter Date: 04/02/2020  OT End of Session - 04/02/20 1504    Visit Number  13    Number of Visits  20   Date for OT Re-Evaluation  04/26/20    Authorization Type  [employer] - Visit Number  13    Authorization - Number of Visits  83    OT Start Time  1301    OT Stop Time  1340    OT Time Calculation (min)  39 min    Activity Tolerance  Patient tolerated treatment well    Behavior During Therapy  WFL for tasks assessed/performed       Past Medical History:  Diagnosis Date  . Anxiety   . Arthritis    patient reported  . Depression   . Hypertension   . IBS (irritable bowel syndrome)   . 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 APPENDECTOMY    . CESAREAN SECTION    . COLONOSCOPY N/A 05/04/2018   Procedure: COLONOSCOPY;  Surgeon: Daneil Dolin, MD;  [facility];  Service: Endoscopy;  Laterality: N/A;  2:00pm  . PILONIDAL CYST EXCISION    . ROTATOR CUFF REPAIR Right     There were no vitals filed for this visit.  Subjective Assessment - 04/02/20 1302    Subjective   S: It feels like it is bone on bone.    Pertinent History  Patient is a 54 y/o female S/P right shoulder arthroscopy, SAD, and open DCR, open RTC repair, and open bicep tendodesis which occurred on 01/30/2020. Dr. Veverly Fells has referred patient to occupational therapy for evaluation and treatment.    Currently in Pain?  Yes    Pain Score  1     Pain Location  Shoulder    Pain Orientation  Left                   OT Treatments/Exercises (OP) - 04/02/20 1319      Exercises   Exercises  Shoulder      Shoulder Exercises: Supine   Protraction  PROM;5 reps;Strengthening;12 reps    Protraction Weight (lbs)  1    Horizontal ABduction  PROM;5 reps;Strengthening;12 reps    Horizontal ABduction Weight (lbs)  1    External Rotation  PROM;5 reps;Strengthening;15 reps    External Rotation Weight (lbs)  1    Internal Rotation  PROM;5 reps;Strengthening;15 reps    Internal Rotation Weight (lbs)  1    Flexion  PROM;5 reps;Strengthening;15 reps    Shoulder Flexion Weight (lbs)  1    ABduction  PROM;5 reps    Other Supine Exercises  --      Shoulder Exercises: Standing   Horizontal ABduction  Strengthening;10 reps;Theraband    Theraband Level (Shoulder Horizontal ABduction)  Level 4 (Blue)    Horizontal ABduction Limitations  Across chest    Extension  Strengthening;10 reps;Theraband    Theraband Level (Shoulder Extension)  Level 4 (Blue)    Row  Strengthening;10 reps;Theraband    Theraband Level (Shoulder Row)  Level 4 (Blue)    Other Standing Exercises  standing  holding large green therapy ball completed 10 repetiitions of chest press, overhead press, and shoulder flexion, shoulder external rotation 10 times.        Shoulder Exercises: Pulleys   Flexion  1 minute   Sitting   ABduction  1 minute   Sitting     Shoulder Exercises: ROM/Strengthening   Proximal Shoulder Strengthening, Supine  Criss cross, circles, flutters. 10x. 1lb weight resistance.               OT Short Term Goals - 03/14/20 1631      OT SHORT TERM GOAL #1   Title  Patient will be educated and independent with HEP in order to increase functional use of her LUE and allow her to begin using it for 50% or more of daily tasks.    Time  6    Period  Weeks    Target Date  03/15/20      OT SHORT TERM GOAL #2   Title  Patient will increase LUE (shoulder/elbow)  P/ROM to [facility] in order increase ability to get her shirts on and off with less difficulty.    Time  6    Period   Weeks    Status  On-going      OT SHORT TERM GOAL #3   Title  Patient will decrease fascial restrictions to moderate amount in order to increase functional mobility needed to complete low level reaching tasks.    Time  6    Period  Weeks      OT SHORT TERM GOAL #4   Title  Patient will decrease pain level to approximately 5/10 or less when completing basic self care tasks such as bathing.    Time  6    Period  Weeks    Status  On-going      OT SHORT TERM GOAL #5   Title  Patient will  increase her LUE shoulder strength to 3/5 in order to complete household tasks at or below shoulder level.    Time  6    Period  Weeks    Status  On-going        OT Long Term Goals - 02/08/20 1308      OT LONG TERM GOAL #1   Title  Patient will return to using her LUE for all daily tasks 75% or more of the time while working towards returning to work.    Time  12    Period  Weeks    Status  On-going      OT LONG TERM GOAL #2   Title  Patient will increase her left shoulder and elbow A/ROM to [facility] or better to complete overhead reaching tasks.    Time  12    Period  Weeks    Status  On-going      OT LONG TERM GOAL #3   Title  Patient will increase her left shoulder strength to 4+/5 to increase ability to lift heavier weight while working towards returning to work tasks.    Time  12    Period  Weeks    Status  On-going      OT LONG TERM GOAL #4   Title  Patient will decrease fascial restrictions to min amount or less in order to increase functional mobility needed to complete reaching tasks.    Time  12    Period  Weeks    Status  On-going      OT LONG TERM GOAL #5  Title  Patient will report a decrease in pain of approximately 3/10 or less when completing daily tasks using her LUE.    Time  12    Period  Weeks    Status  On-going            Plan - 04/02/20 1504    Clinical Impression Statement  A: Initated with myofascial release and PROM. AROM with 1lb resistance in supine.  AAROM with therapy ball. internal rotation behind back with 1lb weight. pulley for stretch and AAROM and theraband for strengthening. Providing cues for positioning and form.    Occupational performance deficits (Please refer to evaluation for details):  ADL's;IADL's;Work;Rest and Sleep;Leisure    Body Structure / Function / Physical Skills  ADL;UE functional use;Fascial restriction;Pain;ROM;Scar mobility;Strength    Plan  P: Continue strengthing tasks with 1# weight and therbands.       Patient will benefit from skilled therapeutic intervention in order to improve the following deficits and impairments:   Body Structure / Function / Physical Skills: ADL, UE functional use, Fascial restriction, Pain, ROM, Scar mobility, Strength       Visit Diagnosis: Stiffness of left shoulder, not elsewhere classified  Other symptoms and signs involving the musculoskeletal system  Acute pain of left shoulder  Stiffness of left elbow, not elsewhere classified    Problem List Patient Active Problem List   Diagnosis Date Noted  . Atypical pneumonia 08/14/2018  . Nausea vomiting and diarrhea 08/14/2018  . Hyponatremia 08/14/2018  . Hypokalemia 08/14/2018  . Reactive thrombocytosis 08/14/2018  . Anxiety 08/14/2018  . Loss of weight 06/15/2018  . Abdominal pain 03/11/2018  . Loose stools 03/11/2018    Heywood Footman Leam Madero 04/02/2020, 3:09 PM  [place], [place], [address] Phone: [phone]   Fax:  [phone]  Name: Dawn Edwards MRN: [id number] Date of Birth: 01-24-1966

## 2020-04-04 ENCOUNTER — Encounter (HOSPITAL_COMMUNITY): Payer: Self-pay | Admitting: Specialist

## 2020-04-04 ENCOUNTER — Ambulatory Visit (HOSPITAL_COMMUNITY): Payer: 59 | Admitting: Specialist

## 2020-04-04 ENCOUNTER — Other Ambulatory Visit: Payer: Self-pay

## 2020-04-04 DIAGNOSIS — M25512 Pain in left shoulder: Secondary | ICD-10-CM

## 2020-04-04 DIAGNOSIS — R29898 Other symptoms and signs involving the musculoskeletal system: Secondary | ICD-10-CM

## 2020-04-04 DIAGNOSIS — M25612 Stiffness of left shoulder, not elsewhere classified: Secondary | ICD-10-CM

## 2020-04-05 ENCOUNTER — Encounter: Payer: Self-pay | Admitting: Dermatology

## 2020-04-05 ENCOUNTER — Ambulatory Visit (INDEPENDENT_AMBULATORY_CARE_PROVIDER_SITE_OTHER): Payer: 59 | Admitting: Dermatology

## 2020-04-05 DIAGNOSIS — D099 Carcinoma in situ, unspecified: Secondary | ICD-10-CM

## 2020-04-05 DIAGNOSIS — D0439 Carcinoma in situ of skin of other parts of face: Secondary | ICD-10-CM

## 2020-04-05 NOTE — Progress Notes (Signed)
   Follow-Up Visit   Subjective  Dawn Edwards is a 54 y.o. female who presents for the following: Procedure (SCC KA X1 RIGHT UPPER AYGOMATIC AREA).   Location: Right outer zygoma Duration:  Quality:  Associated Signs/Symptoms: Modifying Factors:  Severity:  Timing: Context: For treatment  The following portions of the chart were reviewed this encounter and updated as appropriate:     Objective  Well appearing patient in no apparent distress; mood and affect are within normal limits.  A focused examination was performed including Head and neck.. Relevant physical exam findings are noted in the Assessment and Plan.   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  Destruction of lesion Complexity: simple   Destruction method: electrodesiccation and curettage   Informed consent: discussed and consent obtained   Timeout:  patient name, date of birth, surgical site, and procedure verified Anesthesia: the lesion was anesthetized in a standard fashion   Anesthetic:  1% lidocaine w/ epinephrine 1-100,000 local infiltration Curettage performed in three different directions: Yes   Curettage cycles:  3 Lesion length (cm):  0.8 Lesion width (cm):  0.8 Margin per side (cm):  0 Final wound size (cm):  0.8 Hemostasis achieved with:  ferric subsulfate Outcome: patient tolerated procedure well with no complications   Post-procedure details: wound care instructions given   Additional details:  Inoculated with parenteral 5% fluorouracil  Residual 75mm waxy erythema without nodularity or deep palpability.Post curet size 0.8cm.

## 2020-04-05 NOTE — Therapy (Signed)
[place], [place], [address] Phone: [phone]   Fax:  [phone]  Occupational Therapy Treatment  Patient Details  Name: Dawn Edwards MRN: [id number] Date of Birth: [birth year] Referring Provider (OT): Esmond Plants, MD   Encounter Date: 04/04/2020  OT End of Session - 04/04/20 1405    Visit Number  14    Number of Visits  20    Date for OT Re-Evaluation  04/26/20    Authorization Type  [employer]    Authorization Time Period  No authorization needed. 90 combined visits PT/OT/SLP. 7 used.    Authorization - Visit Number  14    Authorization - Number of Visits  3    OT Start Time  1302    OT Stop Time  1342    OT Time Calculation (min)  40 min    Activity Tolerance  Patient tolerated treatment well    Behavior During Therapy  WFL for tasks assessed/performed       Past Medical History:  Diagnosis Date  . Anxiety   . Arthritis    patient reported  . Depression   . Hypertension   . IBS (irritable bowel syndrome)   . 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 APPENDECTOMY    . CESAREAN SECTION    . COLONOSCOPY N/A 05/04/2018   Procedure: COLONOSCOPY;  Surgeon: Daneil Dolin, MD;  [facility];  Service: Endoscopy;  Laterality: N/A;  2:00pm  . PILONIDAL CYST EXCISION    . ROTATOR CUFF REPAIR Right     There were no vitals filed for this visit.  Subjective Assessment - 04/04/20 1404    Subjective   S:  I can reach ok, I just dont have any stamina, I cant lift anything overhead.    Currently in Pain?  Yes    Pain Score  4     Pain Location  Shoulder    Pain Orientation  Left    Pain Descriptors / Indicators  Sharp    Pain Type  Acute pain         [facility] OT Assessment - 04/05/20 0001      Assessment   Medical Diagnosis  Left RTC repair, SAD, DCR    Referring  Provider (OT)  Esmond Plants, MD      Precautions   Precautions  Shoulder    Type of Shoulder Precautions  MD ok'd weights when able.       ADL   ADL comments  improved ease reaching overhead, behind back, behind head.  Lifting weight (clothes, boxes, pots and pans above shoulder height is difficult, as is weightbearing on her left arm, poor sustained activity tolerance       Observation/Other Assessments   Focus on Therapeutic Outcomes (FOTO)   59/100      Palpation   Palpation comment  min fascial restrictions in anterior shoulder region      AROM   Overall AROM Comments  assessed seated     Left Shoulder Flexion  154 Degrees   131   Left Shoulder ABduction  170 Degrees   0   Left Shoulder Internal Rotation  90 Degrees   90   Left Shoulder External Rotation  82 Degrees   78     PROM   Overall PROM Comments  Assessed in supine. IR/er  adducted. Two towels for support.     Left Shoulder Flexion  160 Degrees   138   Left Shoulder ABduction  165 Degrees   0   Left Shoulder Internal Rotation  90 Degrees   90   Left Shoulder External Rotation  70 Degrees   70     Strength   Overall Strength Comments  assessed in seated, external and internal rotation with 0 adduction    Left Shoulder Flexion  4+/5    Left Shoulder ABduction  4+/5    Left Shoulder Internal Rotation  4-/5    Left Shoulder External Rotation  4-/5               OT Treatments/Exercises (OP) - 04/05/20 0001      Exercises   Exercises  Shoulder      Shoulder Exercises: Supine   Protraction  PROM;5 reps    Horizontal ABduction  PROM;5 reps    External Rotation  PROM;5 reps    Internal Rotation  PROM;5 reps    Flexion  PROM;5 reps    ABduction  PROM;5 reps      Shoulder Exercises: Standing   Other Standing Exercises  standing holding basketball completed 10 repetiitions x 2 of chest press, overhead press,      Shoulder Exercises: ROM/Strengthening   Ball on Wall  1' flexed and 1' abducted     Other ROM/Strengthening Exercises  stretch into flexion by walking basketball up wall and down 10 times     Other ROM/Strengthening Exercises  LUE neural glide x 5 with max verbal guidance      Manual Therapy   Manual Therapy  Myofascial release    Manual therapy comments  Manual therapy completed prior to exercises.     Myofascial Release  Myofascial release and manual stretching completed to left upper arm, trapezius, and scapularis region.              OT Education - 04/04/20 1405    Education Details  neural stretch for left upper extremity    Person(s) Educated  Patient    Methods  Explanation;Demonstration    Comprehension  Verbalized understanding;Returned demonstration       OT Short Term Goals - 03/14/20 1631      OT SHORT TERM GOAL #1   Title  Patient will be educated and independent with HEP in order to increase functional use of her LUE and allow her to begin using it for 50% or more of daily tasks.    Time  6    Period  Weeks    Target Date  03/15/20      OT SHORT TERM GOAL #2   Title  Patient will increase LUE (shoulder/elbow)  P/ROM to [facility] in order increase ability to get her shirts on and off with less difficulty.    Time  6    Period  Weeks    Status  On-going      OT SHORT TERM GOAL #3   Title  Patient will decrease fascial restrictions to moderate amount in order to increase functional mobility needed to complete low level reaching tasks.    Time  6    Period  Weeks      OT SHORT TERM GOAL #4   Title  Patient will decrease pain level to approximately 5/10 or less when completing basic self care tasks such as bathing.    Time  6    Period  Weeks  Status  On-going      OT SHORT TERM GOAL #5   Title  Patient will  increase her LUE shoulder strength to 3/5 in order to complete household tasks at or below shoulder level.    Time  6    Period  Weeks    Status  On-going        OT Long Term Goals - 02/08/20 1308      OT LONG TERM GOAL #1    Title  Patient will return to using her LUE for all daily tasks 75% or more of the time while working towards returning to work.    Time  12    Period  Weeks    Status  On-going      OT LONG TERM GOAL #2   Title  Patient will increase her left shoulder and elbow A/ROM to [facility] or better to complete overhead reaching tasks.    Time  12    Period  Weeks    Status  On-going      OT LONG TERM GOAL #3   Title  Patient will increase her left shoulder strength to 4+/5 to increase ability to lift heavier weight while working towards returning to work tasks.    Time  12    Period  Weeks    Status  On-going      OT LONG TERM GOAL #4   Title  Patient will decrease fascial restrictions to min amount or less in order to increase functional mobility needed to complete reaching tasks.    Time  12    Period  Weeks    Status  On-going      OT LONG TERM GOAL #5   Title  Patient will report a decrease in pain of approximately 3/10 or less when completing daily tasks using her LUE.    Time  12    Period  Weeks    Status  On-going            Plan - 04/04/20 1405    Clinical Impression Statement  A:  Patient has made significant improvements with a/rom, p/rom, strength.  patient continues to have increased pain in axillary, joint, and anterior shoulder region.  patient also has decreased strength when completing overhead activities, or completing functional activiites that require sustained activity tolerance.    Occupational performance deficits (Please refer to evaluation for details):  ADL's;IADL's;Work;Rest and Sleep;Leisure    Body Structure / Function / Physical Skills  ADL;UE functional use;Fascial restriction;Pain;ROM;Scar mobility;Strength    Plan  P:  Continue skilled OT intervention 2 times per week with focus on sustained activity tolerance and overhead strength.  Next session:  follow up on neural stretch, add ifes with heat for pain control at end of session.       Patient will  benefit from skilled therapeutic intervention in order to improve the following deficits and impairments:   Body Structure / Function / Physical Skills: ADL, UE functional use, Fascial restriction, Pain, ROM, Scar mobility, Strength       Visit Diagnosis: Stiffness of left shoulder, not elsewhere classified  Other symptoms and signs involving the musculoskeletal system  Acute pain of left shoulder    Problem List Patient Active Problem List   Diagnosis Date Noted  . Atypical pneumonia 08/14/2018  . Nausea vomiting and diarrhea 08/14/2018  . Hyponatremia 08/14/2018  . Hypokalemia 08/14/2018  . Reactive thrombocytosis 08/14/2018  . Anxiety 08/14/2018  . Loss of weight 06/15/2018  .  Abdominal pain 03/11/2018  . Loose stools 03/11/2018    Vangie Bicker, [place], OTR/L [phone]  04/05/2020, 8:26 AM  [place] [address] [place], [place], [address] Phone: [phone]   Fax:  [phone]  Name: Dawn Edwards MRN: [id number] Date of Birth: June 19, 1966

## 2020-04-05 NOTE — Patient Instructions (Signed)

## 2020-04-10 ENCOUNTER — Encounter (HOSPITAL_COMMUNITY): Payer: 59 | Admitting: Specialist

## 2020-04-12 ENCOUNTER — Other Ambulatory Visit: Payer: Self-pay

## 2020-04-12 ENCOUNTER — Encounter: Payer: Self-pay | Admitting: Dermatology

## 2020-04-12 ENCOUNTER — Encounter (HOSPITAL_COMMUNITY): Payer: Self-pay | Admitting: Specialist

## 2020-04-12 ENCOUNTER — Ambulatory Visit (HOSPITAL_COMMUNITY): Payer: 59 | Attending: Orthopedic Surgery | Admitting: Specialist

## 2020-04-12 DIAGNOSIS — M25612 Stiffness of left shoulder, not elsewhere classified: Secondary | ICD-10-CM | POA: Diagnosis not present

## 2020-04-12 DIAGNOSIS — M25622 Stiffness of left elbow, not elsewhere classified: Secondary | ICD-10-CM | POA: Insufficient documentation

## 2020-04-12 DIAGNOSIS — M25512 Pain in left shoulder: Secondary | ICD-10-CM | POA: Diagnosis present

## 2020-04-12 DIAGNOSIS — R29898 Other symptoms and signs involving the musculoskeletal system: Secondary | ICD-10-CM | POA: Insufficient documentation

## 2020-04-12 NOTE — Therapy (Signed)
[place], [place], [address] Phone: [phone]   Fax:  [phone]  Occupational Therapy Treatment  Patient Details  Name: Dawn Edwards MRN: [id number] Date of Birth: 1966/07/29 Referring Provider (OT): Esmond Plants, MD   Encounter Date: 04/12/2020  OT End of Session - 04/12/20 2218    Visit Number  15    Number of Visits  20    Date for OT Re-Evaluation  04/26/20    Authorization Type  [employer]    Authorization Time Period  No authorization needed. 90 combined visits PT/OT/SLP. 7 used.    Authorization - Visit Number  15    Authorization - Number of Visits  23    OT Start Time  [id number]    OT Stop Time  1346    OT Time Calculation (min)  40 min    Activity Tolerance  Patient tolerated treatment well    Behavior During Therapy  WFL for tasks assessed/performed       Past Medical History:  Diagnosis Date  . Anxiety   . Arthritis    patient reported  . Depression   . Hypertension   . IBS (irritable bowel syndrome)   . 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 APPENDECTOMY    . CESAREAN SECTION    . COLONOSCOPY N/A 05/04/2018   Procedure: COLONOSCOPY;  Surgeon: Daneil Dolin, MD;  [facility];  Service: Endoscopy;  Laterality: N/A;  2:00pm  . PILONIDAL CYST EXCISION    . ROTATOR CUFF REPAIR Right     There were no vitals filed for this visit.  Subjective Assessment - 04/12/20 2217    Subjective   S:  It feels like a block under my arm    Currently in Pain?  Yes    Pain Score  4     Pain Location  Axilla         [facility] OT Assessment - 04/12/20 0001      Assessment   Medical Diagnosis  Left RTC repair, SAD, DCR    Referring Provider (OT)  Esmond Plants, MD      Precautions   Precautions  Shoulder    Type of Shoulder Precautions  MD ok'd weights when able.                 OT Treatments/Exercises (OP) - 04/12/20 0001      Exercises   Exercises  Shoulder      Shoulder Exercises: Supine   Protraction  PROM;5 reps    Horizontal ABduction  PROM;5 reps    External Rotation  PROM;5 reps    Internal Rotation  PROM;5 reps    Flexion  PROM;5 reps    ABduction  PROM;5 reps      Shoulder Exercises: Standing   Other Standing Exercises  standing leaning on large green therapy ball on wall at chest height completed rotation into modified side plank with right and then left, working on rotation anstabilization.  d alternatively     Other Standing Exercises  holding large green therapy ball completed chest press, flexion, overhead circle 10 times each      Shoulder Exercises: ROM/Strengthening   Other ROM/Strengthening Exercises  stretch into flexion by walking basketball up wall and down 10 times     Other ROM/Strengthening Exercises  LUE neural glide x  5 with max verbal guidance      Functional Reaching Activities   High Level  standing at counter placed clothespins from bucket to vertical pole with left arm, with reports of pain and tightness as she neared end range movement      Manual Therapy   Manual Therapy  Myofascial release    Manual therapy comments  Manual therapy completed prior to exercises.     Myofascial Release  Myofascial release and manual stretching completed to left upper arm, trapezius, and scapularis region.  MFR scapular and subscapularis.             OT Education - 04/12/20 2218    Education Details  followed up on neural stretch and recommended continued repetition for less pain and tightness in LUE    Person(s) Educated  Patient    Methods  Explanation;Demonstration    Comprehension  Verbalized understanding;Returned demonstration       OT Short Term Goals - 03/14/20 1631      OT SHORT TERM GOAL #1   Title  Patient will be educated and independent with HEP in order to increase functional use of her LUE  and allow her to begin using it for 50% or more of daily tasks.    Time  6    Period  Weeks    Target Date  03/15/20      OT SHORT TERM GOAL #2   Title  Patient will increase LUE (shoulder/elbow)  P/ROM to [facility] in order increase ability to get her shirts on and off with less difficulty.    Time  6    Period  Weeks    Status  On-going      OT SHORT TERM GOAL #3   Title  Patient will decrease fascial restrictions to moderate amount in order to increase functional mobility needed to complete low level reaching tasks.    Time  6    Period  Weeks      OT SHORT TERM GOAL #4   Title  Patient will decrease pain level to approximately 5/10 or less when completing basic self care tasks such as bathing.    Time  6    Period  Weeks    Status  On-going      OT SHORT TERM GOAL #5   Title  Patient will  increase her LUE shoulder strength to 3/5 in order to complete household tasks at or below shoulder level.    Time  6    Period  Weeks    Status  On-going        OT Long Term Goals - 02/08/20 1308      OT LONG TERM GOAL #1   Title  Patient will return to using her LUE for all daily tasks 75% or more of the time while working towards returning to work.    Time  12    Period  Weeks    Status  On-going      OT LONG TERM GOAL #2   Title  Patient will increase her left shoulder and elbow A/ROM to [facility] or better to complete overhead reaching tasks.    Time  12    Period  Weeks    Status  On-going      OT LONG TERM GOAL #3   Title  Patient will increase her left shoulder strength to 4+/5 to increase ability to lift heavier weight while working towards returning to work tasks.    Time  12    Period  Weeks    Status  On-going      OT LONG TERM GOAL #4   Title  Patient will decrease fascial restrictions to min amount or less in order to increase functional mobility needed to complete reaching tasks.    Time  12    Period  Weeks    Status  On-going      OT LONG TERM GOAL #5   Title   Patient will report a decrease in pain of approximately 3/10 or less when completing daily tasks using her LUE.    Time  12    Period  Weeks    Status  On-going            Plan - 04/12/20 2219    Clinical Impression Statement  A:  Manual therapy and myofascial release to scapular region and subscapularis with good vasomotor response and less pain and restrictions noted after manual intervention.  reiterated importance of neural stretch for less pain and tightness in left arm.    Body Structure / Function / Physical Skills  ADL;UE functional use;Fascial restriction;Pain;ROM;Scar mobility;Strength    Plan  P:  continue to work on scapular mobility, proximal shoulder stability, neural stretching to promote less pian and tightness in LUE and improved functional use of LUE.       Patient will benefit from skilled therapeutic intervention in order to improve the following deficits and impairments:   Body Structure / Function / Physical Skills: ADL, UE functional use, Fascial restriction, Pain, ROM, Scar mobility, Strength       Visit Diagnosis: Stiffness of left shoulder, not elsewhere classified  Other symptoms and signs involving the musculoskeletal system  Acute pain of left shoulder    Problem List Patient Active Problem List   Diagnosis Date Noted  . Atypical pneumonia 08/14/2018  . Nausea vomiting and diarrhea 08/14/2018  . Hyponatremia 08/14/2018  . Hypokalemia 08/14/2018  . Reactive thrombocytosis 08/14/2018  . Anxiety 08/14/2018  . Loss of weight 06/15/2018  . Abdominal pain 03/11/2018  . Loose stools 03/11/2018    Vangie Bicker, [place], OTR/L [phone]  04/12/2020, 10:29 PM  [place] [address] [place], [place], [address] Phone: [phone]   Fax:  [phone]  Name: JENEAL HICKMOTT MRN: [id number] Date of Birth: 1966-03-07

## 2020-04-17 ENCOUNTER — Other Ambulatory Visit: Payer: Self-pay

## 2020-04-17 ENCOUNTER — Ambulatory Visit (HOSPITAL_COMMUNITY): Payer: 59 | Admitting: Occupational Therapy

## 2020-04-17 ENCOUNTER — Encounter (HOSPITAL_COMMUNITY): Payer: Self-pay | Admitting: Occupational Therapy

## 2020-04-17 DIAGNOSIS — M25512 Pain in left shoulder: Secondary | ICD-10-CM

## 2020-04-17 DIAGNOSIS — M25612 Stiffness of left shoulder, not elsewhere classified: Secondary | ICD-10-CM

## 2020-04-17 DIAGNOSIS — R29898 Other symptoms and signs involving the musculoskeletal system: Secondary | ICD-10-CM

## 2020-04-17 NOTE — Therapy (Signed)
[place], [place], [address] Phone: [phone]   Fax:  [phone]  Occupational Therapy Treatment  Patient Details  Name: Dawn Edwards MRN: [id number] Date of Birth: Jun 20, 1966 Referring Provider (OT): Esmond Plants, MD   Encounter Date: 04/17/2020  OT End of Session - 04/17/20 1512    Visit Number  16    Number of Visits  20    Date for OT Re-Evaluation  04/26/20    Authorization Type  [employer]    Authorization Time Period  No authorization needed. 90 combined visits PT/OT/SLP. 7 used.    Authorization - Visit Number  16    Authorization - Number of Visits  25    OT Start Time  0950    OT Stop Time  1028    OT Time Calculation (min)  38 min    Activity Tolerance  Patient tolerated treatment well    Behavior During Therapy  WFL for tasks assessed/performed       Past Medical History:  Diagnosis Date  . Anxiety   . Arthritis    patient reported  . Depression   . Hypertension   . IBS (irritable bowel syndrome)   . 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 APPENDECTOMY    . CESAREAN SECTION    . COLONOSCOPY N/A 05/04/2018   Procedure: COLONOSCOPY;  Surgeon: Daneil Dolin, MD;  [facility];  Service: Endoscopy;  Laterality: N/A;  2:00pm  . PILONIDAL CYST EXCISION    . ROTATOR CUFF REPAIR Right     There were no vitals filed for this visit.  Subjective Assessment - 04/17/20 1508    Subjective   S: "I just feel like I am not making any progress and it still hurts. I can't put weight through it or hold weight above my head."    Pertinent History  Patient is a 54 y/o female S/P right shoulder arthroscopy, SAD, and open DCR, open RTC repair, and open bicep tendodesis which occurred on 01/30/2020. Dr. Veverly Fells has referred patient to occupational therapy for evaluation and  treatment.    Currently in Pain?  Yes    Pain Score  6     Pain Location  Axilla    Pain Orientation  Left    Pain Descriptors / Indicators  Constant;Sore;Sharp    Pain Type  Acute pain    Pain Onset  In the past 7 days                   OT Treatments/Exercises (OP) - 04/17/20 1013      Exercises   Exercises  Shoulder      Shoulder Exercises: Supine   Protraction  PROM;5 reps;AAROM;10 reps   dowel   Horizontal ABduction  PROM;5 reps;AAROM;10 reps   dowel   External Rotation  PROM;5 reps;AAROM;10 reps   dowel   Internal Rotation  PROM;5 reps;AAROM;10 reps   dowel   Flexion  PROM;5 reps;AAROM;10 reps   dowel   ABduction  PROM;5 reps;AAROM;10 reps   dowel     Shoulder Exercises: Prone   Flexion  AROM;5 reps;Strengthening;10 reps;Weights    Flexion Weight (lbs)  1    Extension  Strengthening;10 reps;Weights    Extension Weight (lbs)  1    Other Prone Exercises  Pendulum circles for 1 min  Shoulder Exercises: Stretch   Other Shoulder Stretches  Holding door handle with LUE and turning torsoe to right; hold 10 sec      Manual Therapy   Manual Therapy  Myofascial release    Manual therapy comments  Manual therapy completed prior to exercises.     Myofascial Release  Myofascial release and manual stretching completed to left upper arm, trapezius, and scapularis region.  MFR scapular and subscapularis.             OT Education - 04/17/20 1511    Education Details  Education on pendulums in prone as well as holding 1# weight in prone for pain relief       OT Short Term Goals - 03/14/20 1631      OT SHORT TERM GOAL #1   Title  Patient will be educated and independent with HEP in order to increase functional use of her LUE and allow her to begin using it for 50% or more of daily tasks.    Time  6    Period  Weeks    Target Date  03/15/20      OT SHORT TERM GOAL #2   Title  Patient will increase LUE (shoulder/elbow)  P/ROM to [facility] in order increase  ability to get her shirts on and off with less difficulty.    Time  6    Period  Weeks    Status  On-going      OT SHORT TERM GOAL #3   Title  Patient will decrease fascial restrictions to moderate amount in order to increase functional mobility needed to complete low level reaching tasks.    Time  6    Period  Weeks      OT SHORT TERM GOAL #4   Title  Patient will decrease pain level to approximately 5/10 or less when completing basic self care tasks such as bathing.    Time  6    Period  Weeks    Status  On-going      OT SHORT TERM GOAL #5   Title  Patient will  increase her LUE shoulder strength to 3/5 in order to complete household tasks at or below shoulder level.    Time  6    Period  Weeks    Status  On-going        OT Long Term Goals - 02/08/20 1308      OT LONG TERM GOAL #1   Title  Patient will return to using her LUE for all daily tasks 75% or more of the time while working towards returning to work.    Time  12    Period  Weeks    Status  On-going      OT LONG TERM GOAL #2   Title  Patient will increase her left shoulder and elbow A/ROM to [facility] or better to complete overhead reaching tasks.    Time  12    Period  Weeks    Status  On-going      OT LONG TERM GOAL #3   Title  Patient will increase her left shoulder strength to 4+/5 to increase ability to lift heavier weight while working towards returning to work tasks.    Time  12    Period  Weeks    Status  On-going      OT LONG TERM GOAL #4   Title  Patient will decrease fascial restrictions to min amount or less in order to increase  functional mobility needed to complete reaching tasks.    Time  12    Period  Weeks    Status  On-going      OT LONG TERM GOAL #5   Title  Patient will report a decrease in pain of approximately 3/10 or less when completing daily tasks using her LUE.    Time  12    Period  Weeks    Status  On-going            Plan - 04/17/20 1513    Clinical Impression Statement   A: Manual therapy and myofascial release for pain and ROM. Pt reporting frustration with progress and pain. Focused session on AROM in supine and prone. At end of session, pt reporting decreased pain and "less stiff" feeling. Providing tactile and verbal cues.    Occupational performance deficits (Please refer to evaluation for details):  ADL's;IADL's;Work;Rest and Sleep;Leisure    Plan  P:  continue to work on scapular mobility, proximal shoulder stability, neural stretching to promote less pian and tightness in LUE and improved functional use of LUE. Attempting starting in prone for PROM and AROM.       Patient will benefit from skilled therapeutic intervention in order to improve the following deficits and impairments:           Visit Diagnosis: Stiffness of left shoulder, not elsewhere classified  Other symptoms and signs involving the musculoskeletal system  Acute pain of left shoulder    Problem List Patient Active Problem List   Diagnosis Date Noted  . Atypical pneumonia 08/14/2018  . Nausea vomiting and diarrhea 08/14/2018  . Hyponatremia 08/14/2018  . Hypokalemia 08/14/2018  . Reactive thrombocytosis 08/14/2018  . Anxiety 08/14/2018  . Loss of weight 06/15/2018  . Abdominal pain 03/11/2018  . Loose stools 03/11/2018    Neal Dy, MSOT, OTR/L 04/17/2020, 3:35 PM  [place], [place], [address] Phone: [phone]   Fax:  [phone]  Name: BRITTINI BRUBECK MRN: [id number] Date of Birth: 09/25/1966

## 2020-04-19 ENCOUNTER — Other Ambulatory Visit: Payer: Self-pay

## 2020-04-19 ENCOUNTER — Encounter (HOSPITAL_COMMUNITY): Payer: Self-pay | Admitting: Occupational Therapy

## 2020-04-19 ENCOUNTER — Ambulatory Visit (HOSPITAL_COMMUNITY): Payer: 59 | Admitting: Occupational Therapy

## 2020-04-19 DIAGNOSIS — M25612 Stiffness of left shoulder, not elsewhere classified: Secondary | ICD-10-CM

## 2020-04-19 DIAGNOSIS — M25512 Pain in left shoulder: Secondary | ICD-10-CM

## 2020-04-19 DIAGNOSIS — R29898 Other symptoms and signs involving the musculoskeletal system: Secondary | ICD-10-CM

## 2020-04-19 NOTE — Therapy (Signed)
[place], [place], [address] Phone: [phone]   Fax:  [phone]  Occupational Therapy Treatment  Patient Details  Name: Dawn Edwards MRN: [id number] Date of Birth: 08/30/1966 Referring Provider (OT): Esmond Plants, MD   Encounter Date: 04/19/2020   OT End of Session - 04/19/20 1443    Visit Number 17    Number of Visits 20    Date for OT Re-Evaluation 04/26/20    Authorization Type [employer]    Authorization Time Period No authorization needed. 90 combined visits PT/OT/SLP. 7 used.    Authorization - Visit Number 17    Authorization - Number of Visits 83    OT Start Time 1300    OT Stop Time 1340    OT Time Calculation (min) 40 min    Activity Tolerance Patient tolerated treatment well    Behavior During Therapy WFL for tasks assessed/performed           Past Medical History:  Diagnosis Date  . Anxiety   . Arthritis    patient reported  . Depression   . Hypertension   . IBS (irritable bowel syndrome)   . 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 APPENDECTOMY    . CESAREAN SECTION    . COLONOSCOPY N/A 05/04/2018   Procedure: COLONOSCOPY;  Surgeon: Daneil Dolin, MD;  [facility];  Service: Endoscopy;  Laterality: N/A;  2:00pm  . PILONIDAL CYST EXCISION    . ROTATOR CUFF REPAIR Right     There were no vitals filed for this visit.   Subjective Assessment - 04/19/20 1259    Subjective  S: I'm starting to have this pain 2 or 3 times a day and it lasts for a short time then my arm is really tired.    Currently in Pain? Yes    Pain Score 1     Pain Location Shoulder    Pain Orientation Left    Pain Descriptors / Indicators Aching;Sore;Dull    Pain Type Acute pain    Pain Radiating Towards N/A    Pain Onset In the past 7 days    Pain Frequency Intermittent     Aggravating Factors  movement    Pain Relieving Factors pain medication, ice, heat    Effect of Pain on Daily Activities mod effect on ADLs    Multiple Pain Sites No              [facility] OT Assessment - 04/19/20 1258      Assessment   Medical Diagnosis Left RTC repair, SAD, DCR      Precautions   Precautions Shoulder    Type of Shoulder Precautions Progress as tolerated. MD ok for weights                    OT Treatments/Exercises (OP) - 04/19/20 1315      Exercises   Exercises Shoulder      Shoulder Exercises: Prone   Retraction AROM;10 reps    Flexion Strengthening;10 reps    Flexion Weight (lbs) 1    Extension Strengthening;10 reps    Extension Weight (lbs) 1    Horizontal ABduction 1 Strengthening;10 reps    Horizontal ABduction 1 Weight (lbs) 1    Horizontal ABduction 2 Strengthening;10 reps    Horizontal ABduction 2 Weight (lbs)  1    Other Prone Exercises protraction, 1#, 10X      Shoulder Exercises: Therapy Ball   Other Therapy Ball Exercises green therapy ball: chest press, flexion, circles each direction, 10X each      Shoulder Exercises: Stretch   Internal Rotation Stretch 2 reps   10" horizontal towel   External Rotation Stretch 2 reps;10 seconds    Wall Stretch - Flexion 2 reps;10 seconds    Wall Stretch - ABduction 2 reps;10 seconds    Other Shoulder Stretches pectoralis stretch at wall, 10", 2X      Manual Therapy   Manual Therapy Soft tissue mobilization    Manual therapy comments Manual therapy completed prior to exercises.     Soft tissue mobilization subscapularis release, teres minor release, and serratus anterior mobilization to address trigger points, fascial restrictions, and muscle knots to decrease pain and increase ROM                  OT Education - 04/19/20 1331    Education Details shoulder stretches    Person(s) Educated Patient    Methods Explanation;Demonstration;Handout    Comprehension Verbalized  understanding;Returned demonstration            OT Short Term Goals - 03/14/20 1631      OT SHORT TERM GOAL #1   Title Patient will be educated and independent with HEP in order to increase functional use of her LUE and allow her to begin using it for 50% or more of daily tasks.    Time 6    Period Weeks    Target Date 03/15/20      OT SHORT TERM GOAL #2   Title Patient will increase LUE (shoulder/elbow)  P/ROM to [facility] in order increase ability to get her shirts on and off with less difficulty.    Time 6    Period Weeks    Status On-going      OT SHORT TERM GOAL #3   Title Patient will decrease fascial restrictions to moderate amount in order to increase functional mobility needed to complete low level reaching tasks.    Time 6    Period Weeks      OT SHORT TERM GOAL #4   Title Patient will decrease pain level to approximately 5/10 or less when completing basic self care tasks such as bathing.    Time 6    Period Weeks    Status On-going      OT SHORT TERM GOAL #5   Title Patient will  increase her LUE shoulder strength to 3/5 in order to complete household tasks at or below shoulder level.    Time 6    Period Weeks    Status On-going             OT Long Term Goals - 02/08/20 1308      OT LONG TERM GOAL #1   Title Patient will return to using her LUE for all daily tasks 75% or more of the time while working towards returning to work.    Time 12    Period Weeks    Status On-going      OT LONG TERM GOAL #2   Title Patient will increase her left shoulder and elbow A/ROM to [facility] or better to complete overhead reaching tasks.    Time 12    Period Weeks    Status On-going      OT LONG TERM GOAL #3   Title Patient  will increase her left shoulder strength to 4+/5 to increase ability to lift heavier weight while working towards returning to work tasks.    Time 12    Period Weeks    Status On-going      OT LONG TERM GOAL #4   Title Patient will decrease fascial  restrictions to min amount or less in order to increase functional mobility needed to complete reaching tasks.    Time 12    Period Weeks    Status On-going      OT LONG TERM GOAL #5   Title Patient will report a decrease in pain of approximately 3/10 or less when completing daily tasks using her LUE.    Time 12    Period Weeks    Status On-going                 Plan - 04/19/20 1319    Clinical Impression Statement A: Manual techniques focused at shoulder blade targeting subscapularis, teres minor, and serratus today. Pt with muscle tightness and trigger points in these areas, good response to release and manual technique. Pt completing strengthening in prone today, ROM is WFL. Pt completing shoulder stretches with good form, also added therapy ball exercises for strengthening. Verbal cuing for form and technique.    Body Structure / Function / Physical Skills ADL;UE functional use;Fascial restriction;Pain;ROM;Scar mobility;Strength    Plan P: Reassessment, FOTO. Hold therapy until pt follows up with MD.  Follow up on HEP, continue to address restrictions while in prone, continue with shoulder strengthening    OT Home Exercise Plan Eval: pendulums, A/ROM wrist and forearm, P/ROM elbow; 6/10: shoulder stretches    Consulted and Agree with Plan of Care Patient           Patient will benefit from skilled therapeutic intervention in order to improve the following deficits and impairments:   Body Structure / Function / Physical Skills: ADL, UE functional use, Fascial restriction, Pain, ROM, Scar mobility, Strength       Visit Diagnosis: Stiffness of left shoulder, not elsewhere classified  Other symptoms and signs involving the musculoskeletal system  Acute pain of left shoulder    Problem List Patient Active Problem List   Diagnosis Date Noted  . Atypical pneumonia 08/14/2018  . Nausea vomiting and diarrhea 08/14/2018  . Hyponatremia 08/14/2018  . Hypokalemia  08/14/2018  . Reactive thrombocytosis 08/14/2018  . Anxiety 08/14/2018  . Loss of weight 06/15/2018  . Abdominal pain 03/11/2018  . Loose stools 03/11/2018   Guadelupe Sabin, OTR/L  [phone] 04/19/2020, 2:44 PM  [facility] [address] [place], [place], [address] Phone: [phone]   Fax:  [phone]  Name: Dawn Edwards MRN: [id number] Date of Birth: 02/20/1966

## 2020-04-19 NOTE — Patient Instructions (Signed)
  1) Flexion Wall Stretch    Face wall, place affected handon wall in front of you. Slide hand up the wall  and lean body in towards the wall. Hold for 10 seconds. Repeat 3-5 times. 1-2 times/day.     2) Towel Stretch with Internal Rotation      Gently pull up (or to the side) your affected arm  behind your back with the assist of a towel. Hold 10 seconds, repeat 3-5 times. 1-2 times/day.             3) Corner Stretch    Stand at a corner of a wall, place your arms on the walls with elbows bent. Lean into the corner until a stretch is felt along the front of your chest and/or shoulders. Hold for 10 seconds. Repeat 3-5X, 1-2 times/day.    4) Posterior Capsule Stretch    Bring the involved arm across chest. Grasp elbow and pull toward chest until you feel a stretch in the back of the upper arm and shoulder. Hold 10 seconds. Repeat 3-5X. Complete 1-2 times/day.    5) Scapular Retraction    Tuck chin back as you pinch shoulder blades together.  Hold 5 seconds. Repeat 3-5X. Complete 1-2 times/day.    6) External Rotation Stretch:     Place your affected hand on the wall with the elbow bent and gently turn your body the opposite direction until a stretch is felt. Hold 10 seconds, repeat 3-5X. Complete 1-2 times/day.      

## 2020-04-23 ENCOUNTER — Telehealth: Payer: Self-pay

## 2020-04-23 NOTE — Telephone Encounter (Signed)
[employer] form filled out and faxed back to them with when patient can return to work and no limitations.

## 2020-04-23 NOTE — Telephone Encounter (Signed)
Fax received from [employer] needing to know the patient's work status after her visit on 04/05/2020 with Dr. Denna Haggard.  [employer] wanted to know when the patient could return to work and if there were any limitations for the patient to return to work.

## 2020-04-24 ENCOUNTER — Ambulatory Visit (HOSPITAL_COMMUNITY): Payer: 59 | Admitting: Occupational Therapy

## 2020-04-24 ENCOUNTER — Other Ambulatory Visit: Payer: Self-pay

## 2020-04-24 DIAGNOSIS — R29898 Other symptoms and signs involving the musculoskeletal system: Secondary | ICD-10-CM

## 2020-04-24 DIAGNOSIS — M25622 Stiffness of left elbow, not elsewhere classified: Secondary | ICD-10-CM

## 2020-04-24 DIAGNOSIS — M25512 Pain in left shoulder: Secondary | ICD-10-CM

## 2020-04-24 DIAGNOSIS — M25612 Stiffness of left shoulder, not elsewhere classified: Secondary | ICD-10-CM | POA: Diagnosis not present

## 2020-04-24 NOTE — Therapy (Addendum)
[place], [place], [address] Phone: [phone]   Fax:  [phone]  Occupational Therapy Treatment  Patient Details  Name: Dawn Edwards MRN: [id number] Date of Birth: 03/13/1966 Referring Provider (OT): Esmond Plants, MD   Encounter Date: 04/24/2020   OT End of Session - 04/24/20 1826    Visit Number 18    Number of Visits 22    Date for OT Re-Evaluation 06/01/2020   Authorization Type [employer]    Authorization Time Period No authorization needed. 90 combined visits PT/OT/SLP. 7 used.    Authorization - Visit Number 18    Authorization - Number of Visits 75    OT Start Time 9449    OT Stop Time 1810    OT Time Calculation (min) 40 min    Activity Tolerance Patient tolerated treatment well    Behavior During Therapy WFL for tasks assessed/performed           Past Medical History:  Diagnosis Date  . Anxiety   . Arthritis    patient reported  . Depression   . Hypertension   . IBS (irritable bowel syndrome)   . 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 APPENDECTOMY    . CESAREAN SECTION    . COLONOSCOPY N/A 05/04/2018   Procedure: COLONOSCOPY;  Surgeon: Daneil Dolin, MD;  Location[facility];  Service: Endoscopy;  Laterality: N/A;  2:00pm  . PILONIDAL CYST EXCISION    . ROTATOR CUFF REPAIR Right     There were no vitals filed for this visit.       [facility] OT Assessment - 04/24/20 1732      Assessment   Medical Diagnosis Left RTC repair, SAD, DCR    Referring Provider (OT) Esmond Plants, MD      Precautions   Precautions Shoulder    Type of Shoulder Precautions Progress as tolerated. MD ok for weights      ROM / Strength   AROM / PROM / Strength AROM;PROM;Strength      AROM   Overall AROM Comments Sitting    AROM Assessment Site Shoulder    Right/Left  Shoulder Left    Left Shoulder Flexion 150 Degrees   154   Left Shoulder ABduction 165 Degrees   170   Left Shoulder Internal Rotation 94 Degrees   90   Left Shoulder External Rotation 62 Degrees   82     PROM   PROM Assessment Site Shoulder    Right/Left Shoulder Left    Left Shoulder Flexion 145 Degrees   160   Left Shoulder ABduction 155 Degrees   165   Left Shoulder Internal Rotation 90 Degrees   90   Left Shoulder External Rotation 64 Degrees   70     Strength   Overall Strength Comments Sitting    Strength Assessment Site Shoulder    Right/Left Shoulder Left    Left Shoulder Flexion 3-/5    Left Shoulder Extension 4+/5    Left Shoulder ABduction 4+/5    Left Shoulder Internal Rotation 4+/5    Left Shoulder External Rotation 4+/5                    OT Treatments/Exercises (OP) - 04/24/20 1807      Exercises   Exercises Shoulder  Shoulder Exercises: Prone   Flexion AROM;15 reps    Extension AROM;15 reps      Shoulder Exercises: ROM/Strengthening   Ball on Wall 1' counter clockwise. 1' clockwise. flexion      Manual Therapy   Manual Therapy Soft tissue mobilization    Manual therapy comments Manual therapy completed prior to exercises.     Soft tissue mobilization subscapularis release, teres minor release, and serratus anterior mobilization to address trigger points, fascial restrictions, and muscle knots to decrease pain and increase ROM                  OT Education - 04/24/20 1818    Education Details Reviewed shoulder stretches and strengthening exercises. Recommended increas reps and decreas weight during exercises. two times a day.    Person(s) Educated Patient    Methods Explanation;Demonstration;Handout    Comprehension Verbalized understanding;Returned demonstration            OT Short Term Goals - 03/14/20 1631      OT SHORT TERM GOAL #1   Title Patient will be educated and independent with HEP in order to increase  functional use of her LUE and allow her to begin using it for 50% or more of daily tasks.    Time 6    Period Weeks    Target Date 03/15/20      OT SHORT TERM GOAL #2   Title Patient will increase LUE (shoulder/elbow)  P/ROM to [facility] in order increase ability to get her shirts on and off with less difficulty.    Time 6    Period Weeks    Status On-going      OT SHORT TERM GOAL #3   Title Patient will decrease fascial restrictions to moderate amount in order to increase functional mobility needed to complete low level reaching tasks.    Time 6    Period Weeks      OT SHORT TERM GOAL #4   Title Patient will decrease pain level to approximately 5/10 or less when completing basic self care tasks such as bathing.    Time 6    Period Weeks    Status On-going      OT SHORT TERM GOAL #5   Title Patient will  increase her LUE shoulder strength to 3/5 in order to complete household tasks at or below shoulder level.    Time 6    Period Weeks    Status On-going             OT Long Term Goals - 02/08/20 1308      OT LONG TERM GOAL #1   Title Patient will return to using her LUE for all daily tasks 75% or more of the time while working towards returning to work.    Time 12    Period Weeks    Status On-going      OT LONG TERM GOAL #2   Title Patient will increase her left shoulder and elbow A/ROM to [facility] or better to complete overhead reaching tasks.    Time 12    Period Weeks    Status On-going      OT LONG TERM GOAL #3   Title Patient will increase her left shoulder strength to 4+/5 to increase ability to lift heavier weight while working towards returning to work tasks.    Time 12    Period Weeks    Status On-going      OT LONG TERM GOAL #4  Title Patient will decrease fascial restrictions to min amount or less in order to increase functional mobility needed to complete reaching tasks.    Time 12    Period Weeks    Status On-going      OT LONG TERM GOAL #5   Title  Patient will report a decrease in pain of approximately 3/10 or less when completing daily tasks using her LUE.    Time 12    Period Weeks    Status On-going                 Plan - 04/24/20 1819    Clinical Impression Statement A: Mini re-assessment. Manual techniques focused at shoulder blade targeting subscapularis, teres minor, and serratus. Pt with muscle tightness and trigger points in these areas. Pt with good response to release and manual technique. Pt performing AROM in prone. Strengthing at end of session with circles at wall. Verbal cuing for form and technique. Discussed changing to 1xwk for 4wks. Also discussed HEP.    Body Structure / Function / Physical Skills ADL;UE functional use;Fascial restriction;Pain;ROM;Scar mobility;Strength    OT Frequency 1x / week    OT Duration 4 weeks    Plan P: FOTO. Follow up on HEP, continue to address restrictions while in prone, continue with shoulder strengthening    OT Home Exercise Plan Eval: pendulums, A/ROM wrist and forearm, P/ROM elbow; 6/10: shoulder stretches. Reviewed HEP with shoulder stretches and strengthening, 6/15.    Consulted and Agree with Plan of Care Patient           Patient will benefit from skilled therapeutic intervention in order to improve the following deficits and impairments:   Body Structure / Function / Physical Skills: ADL, UE functional use, Fascial restriction, Pain, ROM, Scar mobility, Strength       Visit Diagnosis: Stiffness of left shoulder, not elsewhere classified  Other symptoms and signs involving the musculoskeletal system  Acute pain of left shoulder  Stiffness of left elbow, not elsewhere classified    Problem List Patient Active Problem List   Diagnosis Date Noted  . Atypical pneumonia 08/14/2018  . Nausea vomiting and diarrhea 08/14/2018  . Hyponatremia 08/14/2018  . Hypokalemia 08/14/2018  . Reactive thrombocytosis 08/14/2018  . Anxiety 08/14/2018  . Loss of weight  06/15/2018  . Abdominal pain 03/11/2018  . Loose stools 03/11/2018    Neal Dy, MSOT, OTR/L 04/24/2020, 6:30 PM  [place], [place], [address] Phone: [phone]   Fax:  [phone]  Name: Dawn Edwards MRN: [id number] Date of Birth: 24-Sep-1966

## 2020-05-02 ENCOUNTER — Telehealth (HOSPITAL_COMMUNITY): Payer: Self-pay | Admitting: Occupational Therapy

## 2020-05-02 NOTE — Telephone Encounter (Signed)
Returned pt's call to the office. Pt reports she has a small hard knot, like a bb, between her scapula and spine that has been increasing in size since Thursday, now more marble size. It is uncomfortable and sore. Pt has appt with PCP and ortho MD tomorrow. Advised pt to have MDs look at this spot and to call office if she needs to cancel her next appt.   Guadelupe Sabin, OTR/L  [phone] 05/02/2020

## 2020-05-02 NOTE — Addendum Note (Signed)
Addended by: Guadelupe Sabin A on: 05/02/2020 08:47 AM   Modules accepted: Orders

## 2020-05-04 ENCOUNTER — Ambulatory Visit (HOSPITAL_COMMUNITY): Payer: 59 | Admitting: Specialist

## 2020-05-04 ENCOUNTER — Other Ambulatory Visit: Payer: Self-pay

## 2020-05-04 ENCOUNTER — Encounter (HOSPITAL_COMMUNITY): Payer: Self-pay | Admitting: Specialist

## 2020-05-04 DIAGNOSIS — R29898 Other symptoms and signs involving the musculoskeletal system: Secondary | ICD-10-CM

## 2020-05-04 DIAGNOSIS — M25512 Pain in left shoulder: Secondary | ICD-10-CM

## 2020-05-04 DIAGNOSIS — M25612 Stiffness of left shoulder, not elsewhere classified: Secondary | ICD-10-CM

## 2020-05-04 NOTE — Therapy (Signed)
[place], [place], [address] Phone: [phone]   Fax:  [phone]  Occupational Therapy Treatment  Patient Details  Name: Dawn Edwards MRN: [id number] Date of Birth: 12-26-1965 Referring Provider (OT): Esmond Plants, MD   Encounter Date: 05/04/2020   OT End of Session - 05/04/20 1407    Visit Number 19    Number of Visits 22    Date for OT Re-Evaluation 06/01/20    Authorization Type [employer]    Authorization Time Period No authorization needed. 90 combined visits PT/OT/SLP. 7 used.    Authorization - Visit Number 19    Authorization - Number of Visits 57    OT Start Time 1120    OT Stop Time 1200    OT Time Calculation (min) 40 min    Activity Tolerance Patient tolerated treatment well    Behavior During Therapy WFL for tasks assessed/performed           Past Medical History:  Diagnosis Date  . Anxiety   . Arthritis    patient reported  . Depression   . Hypertension   . IBS (irritable bowel syndrome)   . 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 APPENDECTOMY    . CESAREAN SECTION    . COLONOSCOPY N/A 05/04/2018   Procedure: COLONOSCOPY;  Surgeon: Daneil Dolin, MD;  [facility];  Service: Endoscopy;  Laterality: N/A;  2:00pm  . PILONIDAL CYST EXCISION    . ROTATOR CUFF REPAIR Right     There were no vitals filed for this visit.   Subjective Assessment - 05/04/20 1406    Subjective  S: I have this spot on my back, the doctor thinks its a cyst that you guys made angry.    Pertinent History will avoid manual therapy to rhomboid region, as pea size reddened area present that is 10/10 tender to touch.    Currently in Pain? Yes    Pain Score 3     Pain Location Shoulder    Pain Orientation Left              [facility] OT Assessment - 05/04/20 0001       Assessment   Medical Diagnosis Left RTC repair, SAD, DCR    Referring Provider (OT) Esmond Plants, MD      Precautions   Precautions Shoulder    Type of Shoulder Precautions Progress as tolerated. MD ok for weights                    OT Treatments/Exercises (OP) - 05/04/20 0001      Shoulder Exercises: Prone   Retraction Strengthening;10 reps    Retraction Weight (lbs) 1    Flexion Strengthening;15 reps    Flexion Weight (lbs) 1    Extension Strengthening;15 reps    Extension Weight (lbs) 1    External Rotation Strengthening;15 reps    External Rotation Weight (lbs) 1    Internal Rotation Strengthening;15 reps    Internal Rotation Weight (lbs) 1    Horizontal ABduction 1 Strengthening;15 reps    Horizontal ABduction 1 Weight (lbs) 1    Horizontal ABduction 2 Strengthening;15 reps    Horizontal ABduction 2 Weight (lbs) 1      Shoulder Exercises: ROM/Strengthening   Ball on Wall volley ball 1 min with arm flexed to  90 and 1 min with arm abducted to 90     Other ROM/Strengthening Exercises walk ball up and down wall 15 times       Manual Therapy   Manual Therapy Myofascial release    Manual therapy comments Manual therapy completed prior to exercises.     Myofascial Release Myofascial release and manual stretching completed to left upper arm, trapezius, and scapularis region.  MFR scapular and subscapularis.                    OT Short Term Goals - 03/14/20 1631      OT SHORT TERM GOAL #1   Title Patient will be educated and independent with HEP in order to increase functional use of her LUE and allow her to begin using it for 50% or more of daily tasks.    Time 6    Period Weeks    Target Date 03/15/20      OT SHORT TERM GOAL #2   Title Patient will increase LUE (shoulder/elbow)  P/ROM to [facility] in order increase ability to get her shirts on and off with less difficulty.    Time 6    Period Weeks    Status On-going      OT SHORT TERM GOAL #3    Title Patient will decrease fascial restrictions to moderate amount in order to increase functional mobility needed to complete low level reaching tasks.    Time 6    Period Weeks      OT SHORT TERM GOAL #4   Title Patient will decrease pain level to approximately 5/10 or less when completing basic self care tasks such as bathing.    Time 6    Period Weeks    Status On-going      OT SHORT TERM GOAL #5   Title Patient will  increase her LUE shoulder strength to 3/5 in order to complete household tasks at or below shoulder level.    Time 6    Period Weeks    Status On-going             OT Long Term Goals - 02/08/20 1308      OT LONG TERM GOAL #1   Title Patient will return to using her LUE for all daily tasks 75% or more of the time while working towards returning to work.    Time 12    Period Weeks    Status On-going      OT LONG TERM GOAL #2   Title Patient will increase her left shoulder and elbow A/ROM to [facility] or better to complete overhead reaching tasks.    Time 12    Period Weeks    Status On-going      OT LONG TERM GOAL #3   Title Patient will increase her left shoulder strength to 4+/5 to increase ability to lift heavier weight while working towards returning to work tasks.    Time 12    Period Weeks    Status On-going      OT LONG TERM GOAL #4   Title Patient will decrease fascial restrictions to min amount or less in order to increase functional mobility needed to complete reaching tasks.    Time 12    Period Weeks    Status On-going      OT LONG TERM GOAL #5   Title Patient will report a decrease in pain of approximately 3/10 or less when completing daily tasks using her  LUE.    Time 12    Period Weeks    Status On-going                 Plan - 05/04/20 1408    Clinical Impression Statement A:  completed manual therapy in prone, avoiding rhomboid region, less tenderness and restrictions noted in teres minor region.  added 1# to prone  strengthening.    Body Structure / Function / Physical Skills ADL;UE functional use;Fascial restriction;Pain;ROM;Scar mobility;Strength    Plan P:  FOTO, prone manual therapy and proximal shoulder strengthening           Patient will benefit from skilled therapeutic intervention in order to improve the following deficits and impairments:   Body Structure / Function / Physical Skills: ADL, UE functional use, Fascial restriction, Pain, ROM, Scar mobility, Strength       Visit Diagnosis: Stiffness of left shoulder, not elsewhere classified  Other symptoms and signs involving the musculoskeletal system  Acute pain of left shoulder    Problem List Patient Active Problem List   Diagnosis Date Noted  . Atypical pneumonia 08/14/2018  . Nausea vomiting and diarrhea 08/14/2018  . Hyponatremia 08/14/2018  . Hypokalemia 08/14/2018  . Reactive thrombocytosis 08/14/2018  . Anxiety 08/14/2018  . Loss of weight 06/15/2018  . Abdominal pain 03/11/2018  . Loose stools 03/11/2018    Vangie Bicker, [place], OTR/L [phone]  05/04/2020, 2:12 PM  [place] [address] [place], [place], [address] Phone: [phone]   Fax:  [phone]  Name: KAIULANI SITTON MRN: [id number] Date of Birth: 1966-08-21

## 2020-05-07 ENCOUNTER — Ambulatory Visit (HOSPITAL_COMMUNITY): Payer: 59 | Admitting: Specialist

## 2020-05-10 ENCOUNTER — Encounter (HOSPITAL_COMMUNITY): Payer: Self-pay | Admitting: Specialist

## 2020-05-17 ENCOUNTER — Encounter (HOSPITAL_COMMUNITY): Payer: Self-pay | Admitting: Occupational Therapy

## 2020-05-17 ENCOUNTER — Other Ambulatory Visit: Payer: Self-pay

## 2020-05-17 ENCOUNTER — Ambulatory Visit (HOSPITAL_COMMUNITY): Payer: 59 | Attending: Orthopedic Surgery | Admitting: Occupational Therapy

## 2020-05-17 DIAGNOSIS — M25612 Stiffness of left shoulder, not elsewhere classified: Secondary | ICD-10-CM | POA: Diagnosis present

## 2020-05-17 DIAGNOSIS — R29898 Other symptoms and signs involving the musculoskeletal system: Secondary | ICD-10-CM | POA: Diagnosis present

## 2020-05-17 DIAGNOSIS — M25512 Pain in left shoulder: Secondary | ICD-10-CM

## 2020-05-17 DIAGNOSIS — M25622 Stiffness of left elbow, not elsewhere classified: Secondary | ICD-10-CM

## 2020-05-17 NOTE — Therapy (Signed)
[place], [place], [address] Phone: [phone]   Fax:  [phone]  Occupational Therapy Treatment  Patient Details  Name: Dawn Edwards MRN: [id number] Date of Birth: Sep 30, 1966 Referring Provider (OT): Esmond Plants, MD   Encounter Date: 05/17/2020   OT End of Session - 05/17/20 1830    Visit Number 20    Number of Visits 22    Date for OT Re-Evaluation 06/01/20    Authorization Type [employer]    Authorization Time Period No authorization needed. 90 combined visits PT/OT/SLP. 7 used.    Authorization - Visit Number 19    Authorization - Number of Visits 46    OT Start Time 1520    OT Stop Time 1558    OT Time Calculation (min) 38 min    Activity Tolerance Patient tolerated treatment well    Behavior During Therapy WFL for tasks assessed/performed           Past Medical History:  Diagnosis Date  . Anxiety   . Arthritis    patient reported  . Depression   . Hypertension   . IBS (irritable bowel syndrome)   . 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 APPENDECTOMY    . CESAREAN SECTION    . COLONOSCOPY N/A 05/04/2018   Procedure: COLONOSCOPY;  Surgeon: Daneil Dolin, MD;  [facility];  Service: Endoscopy;  Laterality: N/A;  2:00pm  . PILONIDAL CYST EXCISION    . ROTATOR CUFF REPAIR Right     There were no vitals filed for this visit.   Subjective Assessment - 05/17/20 1522    Subjective  S: I just feel like this arm has no muscle.    Currently in Pain? Yes    Pain Score 1     Pain Location Shoulder    Pain Orientation Left    Pain Descriptors / Indicators Aching              [facility] OT Assessment - 05/17/20 1835      Assessment   Medical Diagnosis Left RTC repair, SAD, DCR    Referring Provider (OT) Esmond Plants, MD      Precautions   Precautions  Shoulder    Type of Shoulder Precautions Progress as tolerated. MD ok for weights      Observation/Other Assessments   Focus on Therapeutic Outcomes (FOTO)  53.75   59                   OT Treatments/Exercises (OP) - 05/17/20 1523      Exercises   Exercises Shoulder      Shoulder Exercises: Prone   Retraction Strengthening;15 reps;Weights    Retraction Weight (lbs) 1    Flexion Strengthening;15 reps;Weights    Flexion Weight (lbs) 1    Extension Strengthening;15 reps;Weights    Extension Weight (lbs) 1    External Rotation Strengthening;15 reps    External Rotation Weight (lbs) 1    Internal Rotation Strengthening;15 reps    Internal Rotation Weight (lbs) 1    Horizontal ABduction 1 Strengthening;15 reps    Horizontal ABduction 1 Weight (lbs) 1      Shoulder Exercises: Standing   Flexion AAROM;15 reps   with green therapy ball   Row AAROM;15 reps   with green therapy ball   Other Standing Exercises  Overhead press; 15x; green therapy ball      Shoulder Exercises: Therapy Ball   Flexion 15 reps    ABduction 15 reps      Manual Therapy   Manual Therapy Myofascial release;Taping    Manual therapy comments Manual therapy completed prior to exercises.     Myofascial Release Myofascial release and manual stretching completed to left upper arm, trapezius, and scapularis region.  MFR scapular and subscapularis.    Kinesiotex Facilitate Muscle                    OT Short Term Goals - 03/14/20 1631      OT SHORT TERM GOAL #1   Title Patient will be educated and independent with HEP in order to increase functional use of her LUE and allow her to begin using it for 50% or more of daily tasks.    Time 6    Period Weeks    Target Date 03/15/20      OT SHORT TERM GOAL #2   Title Patient will increase LUE (shoulder/elbow)  P/ROM to [facility] in order increase ability to get her shirts on and off with less difficulty.    Time 6    Period Weeks    Status On-going       OT SHORT TERM GOAL #3   Title Patient will decrease fascial restrictions to moderate amount in order to increase functional mobility needed to complete low level reaching tasks.    Time 6    Period Weeks      OT SHORT TERM GOAL #4   Title Patient will decrease pain level to approximately 5/10 or less when completing basic self care tasks such as bathing.    Time 6    Period Weeks    Status On-going      OT SHORT TERM GOAL #5   Title Patient will  increase her LUE shoulder strength to 3/5 in order to complete household tasks at or below shoulder level.    Time 6    Period Weeks    Status On-going             OT Long Term Goals - 02/08/20 1308      OT LONG TERM GOAL #1   Title Patient will return to using her LUE for all daily tasks 75% or more of the time while working towards returning to work.    Time 12    Period Weeks    Status On-going      OT LONG TERM GOAL #2   Title Patient will increase her left shoulder and elbow A/ROM to [facility] or better to complete overhead reaching tasks.    Time 12    Period Weeks    Status On-going      OT LONG TERM GOAL #3   Title Patient will increase her left shoulder strength to 4+/5 to increase ability to lift heavier weight while working towards returning to work tasks.    Time 12    Period Weeks    Status On-going      OT LONG TERM GOAL #4   Title Patient will decrease fascial restrictions to min amount or less in order to increase functional mobility needed to complete reaching tasks.    Time 12    Period Weeks    Status On-going      OT LONG TERM GOAL #5   Title Patient will report a decrease in pain of approximately 3/10 or  less when completing daily tasks using her LUE.    Time 12    Period Weeks    Status On-going                 Plan - 05/17/20 1831    Clinical Impression Statement A:  completed manual therapy in prone, avoiding rhomboid region, less tenderness and restrictions noted in teres minor region.   added 1# to prone strengthening. K tape applied at end of session for pain relief. Pt reporting increased support at shoulder.Continued AAROM strengthening and stretches with green therapy ball. Cues for technique and form provided thorughout    Body Structure / Function / Physical Skills ADL;UE functional use;Fascial restriction;Pain;ROM;Scar mobility;Strength    Plan P: prone manual therapy and proximal shoulder strengthening           Patient will benefit from skilled therapeutic intervention in order to improve the following deficits and impairments:   Body Structure / Function / Physical Skills: ADL, UE functional use, Fascial restriction, Pain, ROM, Scar mobility, Strength       Visit Diagnosis: Stiffness of left shoulder, not elsewhere classified  Other symptoms and signs involving the musculoskeletal system  Acute pain of left shoulder  Stiffness of left elbow, not elsewhere classified    Problem List Patient Active Problem List   Diagnosis Date Noted  . Atypical pneumonia 08/14/2018  . Nausea vomiting and diarrhea 08/14/2018  . Hyponatremia 08/14/2018  . Hypokalemia 08/14/2018  . Reactive thrombocytosis 08/14/2018  . Anxiety 08/14/2018  . Loss of weight 06/15/2018  . Abdominal pain 03/11/2018  . Loose stools 03/11/2018    Neal Dy, MSOT, OTR/L 05/17/2020, 6:36 PM  [place] [address] [place], [place], [address] Phone: [phone]   Fax:  [phone]  Name: Dawn Edwards MRN: [id number] Date of Birth: Jan 28, 1966

## 2020-05-24 ENCOUNTER — Other Ambulatory Visit: Payer: Self-pay

## 2020-05-24 ENCOUNTER — Ambulatory Visit (HOSPITAL_COMMUNITY): Payer: 59 | Admitting: Specialist

## 2020-05-24 ENCOUNTER — Encounter (HOSPITAL_COMMUNITY): Payer: Self-pay | Admitting: Specialist

## 2020-05-24 DIAGNOSIS — M25612 Stiffness of left shoulder, not elsewhere classified: Secondary | ICD-10-CM

## 2020-05-24 DIAGNOSIS — R29898 Other symptoms and signs involving the musculoskeletal system: Secondary | ICD-10-CM

## 2020-05-24 DIAGNOSIS — M25512 Pain in left shoulder: Secondary | ICD-10-CM

## 2020-05-24 NOTE — Therapy (Signed)
[place], [place], [address] Phone: [phone]   Fax:  [phone]  Occupational Therapy Evaluation  Patient Details  Name: Dawn Edwards MRN: [id number] Date of Birth: 06-10-1966 Referring Provider (OT): Esmond Plants, MD   Encounter Date: 05/24/2020   OT End of Session - 05/24/20 0102    Visit Number 21    Number of Visits 22    Date for OT Re-Evaluation 06/01/20    Authorization Type [employer]    Authorization Time Period No authorization needed. 90 combined visits PT/OT/SLP. 7 used.    Authorization - Visit Number 20    Authorization - Number of Visits 4    OT Start Time 1525    OT Stop Time 1545    OT Time Calculation (min) 20 min    Activity Tolerance Patient tolerated treatment well    Behavior During Therapy WFL for tasks assessed/performed           Past Medical History:  Diagnosis Date  . Anxiety   . Arthritis    patient reported  . Depression   . Hypertension   . IBS (irritable bowel syndrome)   . 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 APPENDECTOMY    . CESAREAN SECTION    . COLONOSCOPY N/A 05/04/2018   Procedure: COLONOSCOPY;  Surgeon: Daneil Dolin, MD;  [facility];  Service: Endoscopy;  Laterality: N/A;  2:00pm  . PILONIDAL CYST EXCISION    . ROTATOR CUFF REPAIR Right     There were no vitals filed for this visit.   Subjective Assessment - 05/24/20 1657    Subjective  S:  I can do so much more.  When I try to lift something overhead, my arm gets tired quickly.    Currently in Pain? No/denies             [facility] OT Assessment - 05/24/20 0001      Assessment   Medical Diagnosis Left RTC repair, SAD, DCR    Referring Provider (OT) Esmond Plants, MD      Precautions   Type of Shoulder Precautions Progress as tolerated. MD ok for weights       Observation/Other Assessments   Focus on Therapeutic Outcomes (FOTO)  63% independent      AROM   Left Shoulder Flexion 155 Degrees   150   Left Shoulder ABduction 160 Degrees   165   Left Shoulder Internal Rotation 94 Degrees   94   Left Shoulder External Rotation 79 Degrees   62     Strength   Left Shoulder Flexion 4+/5   3-/5   Left Shoulder Extension 4+/5   4+/5   Left Shoulder ABduction 4+/5   4+/5   Left Shoulder Internal Rotation 4+/5   4+/5   Left Shoulder External Rotation 4+/5   4+/5                          OT Education - 05/24/20 1657    Education Details reviewed current HEP and recommended continuing, along with sustained activity tolerance overhead iwth gradual PRE    Person(s) Educated Patient    Methods Explanation;Demonstration;Handout    Comprehension Verbalized understanding;Returned demonstration            OT Short Term Goals - 05/24/20 1535  OT SHORT TERM GOAL #1   Title Patient will be educated and independent with HEP in order to increase functional use of her LUE and allow her to begin using it for 50% or more of daily tasks.    Time 6    Period Weeks    Target Date 03/15/20      OT SHORT TERM GOAL #2   Title Patient will increase LUE (shoulder/elbow)  P/ROM to [facility] in order increase ability to get her shirts on and off with less difficulty.    Time 6    Period Weeks    Status Achieved      OT SHORT TERM GOAL #3   Title Patient will decrease fascial restrictions to moderate amount in order to increase functional mobility needed to complete low level reaching tasks.    Time 6    Period Weeks      OT SHORT TERM GOAL #4   Title Patient will decrease pain level to approximately 5/10 or less when completing basic self care tasks such as bathing.    Time 6    Period Weeks    Status Achieved      OT SHORT TERM GOAL #5   Title Patient will  increase her LUE shoulder strength to 3/5 in order to complete household tasks  at or below shoulder level.    Time 6    Period Weeks    Status On-going             OT Long Term Goals - 05/24/20 1536      OT LONG TERM GOAL #1   Title Patient will return to using her LUE for all daily tasks 75% or more of the time while working towards returning to work.    Time 12    Period Weeks    Status Achieved      OT LONG TERM GOAL #2   Title Patient will increase her left shoulder and elbow A/ROM to [facility] or better to complete overhead reaching tasks.    Time 12    Period Weeks    Status Achieved      OT LONG TERM GOAL #3   Title Patient will increase her left shoulder strength to 4+/5 to increase ability to lift heavier weight while working towards returning to work tasks.    Time 12    Period Weeks    Status Achieved      OT LONG TERM GOAL #4   Title Patient will decrease fascial restrictions to min amount or less in order to increase functional mobility needed to complete reaching tasks.    Time 12    Period Weeks    Status Achieved      OT LONG TERM GOAL #5   Title Patient will report a decrease in pain of approximately 3/10 or less when completing daily tasks using her LUE.    Time 12    Period Weeks    Status Achieved                 Plan - 05/24/20 1701    Clinical Impression Statement A:  Patient has improved A/ROM and strength and has met or made progress towards all OT goals.  Patient is satisfied with her current functional status and agrees to dc and will continue HEP independently to continue improving strength and sustained actiivity tolerance.    Body Structure / Function / Physical Skills ADL;UE functional use;Fascial restriction;Pain;ROM;Scar mobility;Strength    Plan P:  DC from skilled OT intervention this date.           Patient will benefit from skilled therapeutic intervention in order to improve the following deficits and impairments:   Body Structure / Function / Physical Skills: ADL, UE functional use, Fascial  restriction, Pain, ROM, Scar mobility, Strength       Visit Diagnosis: Other symptoms and signs involving the musculoskeletal system  Stiffness of left shoulder, not elsewhere classified  Acute pain of left shoulder    Problem List Patient Active Problem List   Diagnosis Date Noted  . Atypical pneumonia 08/14/2018  . Nausea vomiting and diarrhea 08/14/2018  . Hyponatremia 08/14/2018  . Hypokalemia 08/14/2018  . Reactive thrombocytosis 08/14/2018  . Anxiety 08/14/2018  . Loss of weight 06/15/2018  . Abdominal pain 03/11/2018  . Loose stools 03/11/2018    Vangie Bicker, MHA, OTR/L [phone] OCCUPATIONAL THERAPY DISCHARGE SUMMARY  Visits from Start of Care: 20  Current functional level related to goals / functional outcomes: See above   Remaining deficits: See above    Education / Equipment: See above  Plan: Patient agrees to discharge.  Patient goals were met. Patient is being discharged due to meeting the stated rehab goals.  ?????    Vangie Bicker, [place], OTR/L [phone]  05/24/2020, 5:10 PM  [place], [place], [address] Phone: [phone]   Fax:  [phone]  Name: NIKCOLE EISCHEID MRN: [id number] Date of Birth: 11-05-1966

## 2020-07-11 ENCOUNTER — Ambulatory Visit: Payer: 59 | Admitting: Dermatology

## 2020-07-31 ENCOUNTER — Ambulatory Visit (HOSPITAL_COMMUNITY): Payer: 59 | Attending: Orthopedic Surgery | Admitting: Physical Therapy

## 2020-07-31 ENCOUNTER — Other Ambulatory Visit: Payer: Self-pay

## 2020-07-31 ENCOUNTER — Encounter (HOSPITAL_COMMUNITY): Payer: Self-pay | Admitting: Physical Therapy

## 2020-07-31 DIAGNOSIS — R293 Abnormal posture: Secondary | ICD-10-CM | POA: Diagnosis present

## 2020-07-31 DIAGNOSIS — M533 Sacrococcygeal disorders, not elsewhere classified: Secondary | ICD-10-CM | POA: Insufficient documentation

## 2020-07-31 NOTE — Therapy (Signed)
[place], [place], [address] Phone: [phone]   Fax:  [phone]  Physical Therapy Evaluation  Patient Details  Name: Dawn Edwards MRN: [id number] Date of Birth: [birth year] Referring Provider (PT): Melina Schools MD    Encounter Date: 07/31/2020   PT End of Session - 07/31/20 0900    Visit Number 1    Number of Visits 8    Date for PT Re-Evaluation 08/31/20    Authorization Type [employer]    Authorization - Visit Number [id number]    Authorization - Number of Visits 90    PT Start Time 0820    PT Stop Time 0900    PT Time Calculation (min) 40 min    Activity Tolerance Patient limited by pain    Behavior During Therapy [facility] for tasks assessed/performed           Past Medical History:  Diagnosis Date  . Anxiety   . Arthritis    patient reported  . Depression   . Hypertension   . IBS (irritable bowel syndrome)   . 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 APPENDECTOMY    . CESAREAN SECTION    . COLONOSCOPY N/A 05/04/2018   Procedure: COLONOSCOPY;  Surgeon: Daneil Dolin, MD;  [facility];  Service: Endoscopy;  Laterality: N/A;  2:00pm  . PILONIDAL CYST EXCISION    . ROTATOR CUFF REPAIR Right     There were no vitals filed for this visit.    Subjective Assessment - 07/31/20 0831    Subjective Patient presents to physical therapy with complaint of LBP/ SI joint pain. Patient says she has been dealing with this issue for several years, with pain pain that would come and go. Patetin says she thinks this is from wear and tear at work. Patient says pain used to be intermittent, but now is constant. Patient says she was referred for surgery but has to try therapy first. Patient had series of recent injections for SI joint, but she says they have not helped. Patetin is currently  taking Diclofenac. Denies other medications for this issue.    Limitations Sitting;Lifting;Standing;Walking;House hold activities    Patient Stated Goals Less pain    Currently in Pain? Yes    Pain Score 4     Pain Location Hip    Pain Orientation Left;Posterior    Pain Descriptors / Indicators Aching;Throbbing    Pain Type Chronic pain    Pain Radiating Towards LT thigh    Pain Onset More than a month ago    Pain Frequency Constant    Aggravating Factors  Standing, bending, walking, sitting, laying flat    Pain Relieving Factors laying in fetal position    Effect of Pain on Daily Activities Limits              [facility] PT Assessment - 07/31/20 0001      Assessment   Medical Diagnosis LT SI joint pain     Referring Provider (PT) Melina Schools MD     Onset Date/Surgical Date --   Chronic    Prior Therapy Yes       Precautions   Precautions None      Restrictions   Weight Bearing Restrictions No      Prior Function   Level of Independence Independent  Cognition   Overall Cognitive Status Within Functional Limits for tasks assessed      Observation/Other Assessments   Observations sits in trunk flexed position, frequently weight shifts from side to side      Focus on Therapeutic Outcomes (FOTO)  47% limited       Posture/Postural Control   Posture/Postural Control Postural limitations    Postural Limitations Anterior pelvic tilt      ROM / Strength   AROM / PROM / Strength Strength;AROM      AROM   AROM Assessment Site Lumbar    Lumbar Flexion 10% limited     Lumbar Extension 75% limited     Lumbar - Right Side Bend 50% limited     Lumbar - Left Side Bend 75% limited     Lumbar - Right Rotation 75% limited     Lumbar - Left Rotation 50% limited       Special Tests    Special Tests --   No appreciable LLD in supine      Transfers   Five time sit to stand comments  26.7 sec with UEs                      Objective measurements completed on  examination: See above findings.               PT Education - 07/31/20 0834    Education Details on evaluation findings POC and HEP    Person(s) Educated Patient    Methods Explanation;Handout    Comprehension Verbalized understanding            PT Short Term Goals - 07/31/20 1630      PT SHORT TERM GOAL #1   Title Patient will be independent with initial HEP and self-management strategies to improve functional outcomes    Time 2    Period Weeks    Status New    Target Date 08/17/20             PT Long Term Goals - 07/31/20 1631      PT LONG TERM GOAL #1   Title Patient will improve FOTO score by 10% to indicate improvement in functional outcomes    Time 4    Period Weeks    Status New    Target Date 08/31/20      PT LONG TERM GOAL #2   Title Patient will report at least 75% overall improvement in subjective complaint to indicate improvement in ability to perform ADLs.    Time 4    Period Weeks    Status New    Target Date 08/31/20      PT LONG TERM GOAL #3   Title Patient will be able to sit or stand > 45 minutes with pain not to exceed 3/10 in lumbar/ SI area to improve ability to perform cooking, grooming, and work tasks.    Time 4    Period Weeks    Status New                  Plan - 07/31/20 1625    Clinical Impression Statement Patient is a 54 y.o. female who presents to physical therapy with complaint of LT SI joint pain. Patient demonstrates decreased strength, ROM restriction and postural abnormalities which are likely contributing to symptoms of pain and are negatively impacting patient ability to perform ADLs and functional mobility tasks. Patient will benefit from skilled physical therapy services to address  these deficits to reduce pain and improve level of function with ADLs and functional mobility tasks.    Examination-Activity Limitations Stairs;Squat;Sleep;Locomotion Level;Stand;Lift;Carry;Sit    Examination-Participation  Restrictions Art gallery manager;Yard Work;Laundry;Occupation    Stability/Clinical Decision Making Stable/Uncomplicated    Clinical Decision Making Low    Rehab Potential Fair    PT Frequency 2x / week    PT Duration 4 weeks    PT Treatment/Interventions ADLs/Self Care Home Management;Aquatic Therapy;Biofeedback;Cryotherapy;Fluidtherapy;Parrafin;Patient/family education;Therapeutic activities;Manual techniques;Neuromuscular re-education;Passive range of motion;Dry needling;Functional mobility training;Stair training;Moist Heat;Traction;Ultrasound;Iontophoresis 66m/ml Dexamethasone;DME Instruction;Gait training;Balance training;Contrast Bath;Electrical Stimulation;Taping;Compression bandaging;Orthotic Fit/Training;Therapeutic exercise;Joint Manipulations;Vasopneumatic Device;Scar mobilization;Vestibular;Visual/perceptual remediation/compensation;Spinal Manipulations;Energy conservation;Splinting    PT Next Visit Plan Review goals. Issue HEP based on tolernace. Try hip isometric MET again and assess repsonse. If not abduction/ adduciton, try flexion/ extension MET. Try low grade joint mobs otherwise. Manuals PRN.    PT Home Exercise Plan Issue next visit if exercise tolerated well    Consulted and Agree with Plan of Care Patient           Patient will benefit from skilled therapeutic intervention in order to improve the following deficits and impairments:  Pain, Improper body mechanics, Increased fascial restricitons, Decreased activity tolerance, Difficulty walking, Impaired flexibility, Decreased strength, Decreased range of motion, Postural dysfunction, Increased muscle spasms, Abnormal gait  Visit Diagnosis: Sacrococcygeal disorders, not elsewhere classified  Abnormal posture     Problem List Patient Active Problem List   Diagnosis Date Noted  . Atypical pneumonia 08/14/2018  . Nausea vomiting and diarrhea 08/14/2018  . Hyponatremia 08/14/2018  . Hypokalemia 08/14/2018  .  Reactive thrombocytosis 08/14/2018  . Anxiety 08/14/2018  . Loss of weight 06/15/2018  . Abdominal pain 03/11/2018  . Loose stools 03/11/2018    4:35 PM, 07/31/20 CJosue HectorPT DPT  [facility] ([phone]  [facility]SWoodstock N[place] [address]Phone: [phone]  Fax:  [phone] Name: Dawn REINDELMRN: [id number] of Birth: [birth year]

## 2020-08-02 ENCOUNTER — Other Ambulatory Visit: Payer: Self-pay

## 2020-08-02 ENCOUNTER — Encounter (HOSPITAL_COMMUNITY): Payer: Self-pay | Admitting: Physical Therapy

## 2020-08-02 ENCOUNTER — Ambulatory Visit (HOSPITAL_COMMUNITY): Payer: 59 | Admitting: Physical Therapy

## 2020-08-02 DIAGNOSIS — M533 Sacrococcygeal disorders, not elsewhere classified: Secondary | ICD-10-CM | POA: Diagnosis not present

## 2020-08-02 DIAGNOSIS — R293 Abnormal posture: Secondary | ICD-10-CM

## 2020-08-02 NOTE — Therapy (Signed)
[place], [place], [address] Phone: [phone]   Fax:  [phone]  Physical Therapy Treatment  Patient Details  Name: Dawn Edwards MRN: [id number] Date of Birth: 18-Feb-1966 Referring Provider (PT): Melina Schools MD    Encounter Date: 08/02/2020   PT End of Session - 08/02/20 1509    Visit Number 2    Number of Visits 8    Date for PT Re-Evaluation 08/31/20    Authorization Type [place] - Visit Number [id number]    Authorization - Number of Visits 90    PT Start Time 1504    PT Stop Time 0737    PT Time Calculation (min) 38 min    Activity Tolerance Patient limited by pain;Patient tolerated treatment well    Behavior During Therapy [facility] for tasks assessed/performed           Past Medical History:  Diagnosis Date  . Anxiety   . Arthritis    patient reported  . Depression   . Hypertension   . IBS (irritable bowel syndrome)   . 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 APPENDECTOMY    . CESAREAN SECTION    . COLONOSCOPY N/A 05/04/2018   Procedure: COLONOSCOPY;  Surgeon: Daneil Dolin, MD;  [facility];  Service: Endoscopy;  Laterality: N/A;  2:00pm  . PILONIDAL CYST EXCISION    . ROTATOR CUFF REPAIR Right     There were no vitals filed for this visit.   Subjective Assessment - 08/02/20 1507    Subjective Patient says she is huritng. Says whole left hip hurts. Hurts to sit.    Limitations Sitting;Lifting;Standing;Walking;House hold activities    Patient Stated Goals Less pain    Currently in Pain? Yes    Pain Score 6     Pain Location Hip    Pain Orientation Left;Posterior    Pain Descriptors / Indicators Aching;Sharp;Shooting    Pain Type Chronic pain    Pain Onset More than a month ago                             [facility] Adult  PT Treatment/Exercise - 08/02/20 0001      Exercises   Exercises Knee/Hip      Knee/Hip Exercises: Stretches   Other Knee/Hip Stretches hip abd/ add iso 5 x 5'"       Knee/Hip Exercises: Supine   Bridges Both;1 set;10 reps    Bridges Limitations 5 sec hold    Other Supine Knee/Hip Exercises prone lying 2 min       Manual Therapy   Manual Therapy Soft tissue mobilization;Joint mobilization    Manual therapy comments Manual therapy completed separate from all other activity     Joint Mobilization grade I-II P/A mobs to upper LT angle of sacrum with patient in prone     Soft tissue mobilization STM using tennis ball to LT glute med/ lateral sacral border                     PT Short Term Goals - 07/31/20 1630      PT SHORT TERM GOAL #1   Title Patient will be independent with initial HEP and self-management strategies to improve functional outcomes    Time  2    Period Weeks    Status New    Target Date 08/17/20             PT Long Term Goals - 07/31/20 1631      PT LONG TERM GOAL #1   Title Patient will improve FOTO score by 10% to indicate improvement in functional outcomes    Time 4    Period Weeks    Status New    Target Date 08/31/20      PT LONG TERM GOAL #2   Title Patient will report at least 75% overall improvement in subjective complaint to indicate improvement in ability to perform ADLs.    Time 4    Period Weeks    Status New    Target Date 08/31/20      PT LONG TERM GOAL #3   Title Patient will be able to sit or stand > 45 minutes with pain not to exceed 3/10 in lumbar/ SI area to improve ability to perform cooking, grooming, and work tasks.    Time 4    Period Weeks    Status New                 Plan - 08/02/20 1546    Clinical Impression Statement Began session with goals review. Initiated ther ex. Explained process of hip MET for SI joint correction. Patient cued on grading activity to tolerance. Patient cued on foot placement  with hip bridge. Patient did note decreased throbbing type pain post MET. Performed prone lying, then manual joint mobs to sacrum. Also added STM to surrounding musculature to address pain and restriction. Patient reports decreased pain level from 6-7/10 pre session to 3/10 post session and showed improved upright standing posture at end of session. Patient educated on and issued HEP handout.    Examination-Activity Limitations Stairs;Squat;Sleep;Locomotion Level;Stand;Lift;Carry;Sit    Examination-Participation Restrictions Art gallery manager;Yard Work;Laundry;Occupation    Stability/Clinical Decision Making Stable/Uncomplicated    Rehab Potential Fair    PT Frequency 2x / week    PT Duration 4 weeks    PT Treatment/Interventions ADLs/Self Care Home Management;Aquatic Therapy;Biofeedback;Cryotherapy;Fluidtherapy;Parrafin;Patient/family education;Therapeutic activities;Manual techniques;Neuromuscular re-education;Passive range of motion;Dry needling;Functional mobility training;Stair training;Moist Heat;Traction;Ultrasound;Iontophoresis 60m/ml Dexamethasone;DME Instruction;Gait training;Balance training;Contrast Bath;Electrical Stimulation;Taping;Compression bandaging;Orthotic Fit/Training;Therapeutic exercise;Joint Manipulations;Vasopneumatic Device;Scar mobilization;Vestibular;Visual/perceptual remediation/compensation;Spinal Manipulations;Energy conservation;Splinting    PT Next Visit Plan Assess response to HEP hip isometric MET .Continue low grade joint mobs otherwise. Manuals PRN. Progress hip and core strength as tolerated    PT Home Exercise Plan 08/02/20: hip abd/add iso, bridge    Consulted and Agree with Plan of Care Patient           Patient will benefit from skilled therapeutic intervention in order to improve the following deficits and impairments:  Pain, Improper body mechanics, Increased fascial restricitons, Decreased activity tolerance, Difficulty walking, Impaired  flexibility, Decreased strength, Decreased range of motion, Postural dysfunction, Increased muscle spasms, Abnormal gait  Visit Diagnosis: Sacrococcygeal disorders, not elsewhere classified  Abnormal posture     Problem List Patient Active Problem List   Diagnosis Date Noted  . Atypical pneumonia 08/14/2018  . Nausea vomiting and diarrhea 08/14/2018  . Hyponatremia 08/14/2018  . Hypokalemia 08/14/2018  . Reactive thrombocytosis 08/14/2018  . Anxiety 08/14/2018  . Loss of weight 06/15/2018  . Abdominal pain 03/11/2018  . Loose stools 03/11/2018    3:48 PM, 08/02/20 CJosue HectorPT DPT  [facility] [phone]  [place]  [facility] [address] [place], [place], [address] Phone: [phone]   Fax:  [phone]  Name: TARNISHA KACHMAR MRN: [id number] Date of Birth: Dec 06, 1965

## 2020-08-02 NOTE — Patient Instructions (Signed)
Access Code: [email or web address] URL: [email or web address] Date: 08/02/2020 Prepared by: Josue Hector  Exercises Hooklying Isometric Hip Abduction with Belt - 2-3 x daily - 7 x weekly - 1 sets - 10 reps - 5 sec hold Supine Hip Adduction Isometric with Ball - 2-3 x daily - 7 x weekly - 1 sets - 10 reps - 5 sec hold Supine Bridge - 2-3 x daily - 7 x weekly - 31 sets - 10 reps - 5 sec hold

## 2020-08-07 ENCOUNTER — Ambulatory Visit (HOSPITAL_COMMUNITY): Payer: 59 | Admitting: Physical Therapy

## 2020-08-07 ENCOUNTER — Other Ambulatory Visit: Payer: Self-pay

## 2020-08-07 ENCOUNTER — Encounter (HOSPITAL_COMMUNITY): Payer: Self-pay | Admitting: Physical Therapy

## 2020-08-07 DIAGNOSIS — M533 Sacrococcygeal disorders, not elsewhere classified: Secondary | ICD-10-CM

## 2020-08-07 NOTE — Therapy (Signed)
[facility] [address] [place], [place], [address] Phone: [phone]   Fax:  [phone]  Physical Therapy Treatment  Patient Details  Name: Dawn Edwards MRN: [id number] Date of Birth: 1966/04/14 Referring Provider (PT): Venita Lick MD    Encounter Date: 08/07/2020   PT End of Session - 08/07/20 1447    Visit Number 3    Number of Visits 8    Date for PT Re-Evaluation 08/31/20    Authorization Type [employer]    Authorization - Visit Number 59    Authorization - Number of Visits 90    PT Start Time 1446    PT Stop Time 1526    PT Time Calculation (min) 40 min    Activity Tolerance Patient limited by pain;Patient tolerated treatment well    Behavior During Therapy [facility] for tasks assessed/performed           Past Medical History:  Diagnosis Date  . Anxiety   . Arthritis    patient reported  . Depression   . Hypertension   . IBS (irritable bowel syndrome)   . 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 APPENDECTOMY    . CESAREAN SECTION    . COLONOSCOPY N/A 05/04/2018   Procedure: COLONOSCOPY;  Surgeon: Corbin Ade, MD;  [facility];  Service: Endoscopy;  Laterality: N/A;  2:00pm  . PILONIDAL CYST EXCISION    . ROTATOR CUFF REPAIR Right     There were no vitals filed for this visit.   Subjective Assessment - 08/07/20 1453    Subjective States that she felt pretty good after last session, she felt lose. States that she started to get tight in her hip again. States that she saw her MD and he is going to send her to pain management. States that at rest she has 1/10 pain and when she walks she has 4/10 pain in the left hip joint. Pain is constant states that her pain is on the lateral side of her hip but some times it feels like she is sitting on a ping pong ball.    Currently in Pain? Yes     Pain Score 1     Pain Location Hip    Pain Orientation Left;Lateral    Pain Descriptors / Indicators Aching              [facility] PT Assessment - 08/07/20 0001      Assessment   Medical Diagnosis LT SI joint pain     Referring Provider (PT) Venita Lick MD                          [facility] Adult PT Treatment/Exercise - 08/07/20 0001      Knee/Hip Exercises: Seated   Other Seated Knee/Hip Exercises flexion/ext isometic      Knee/Hip Exercises: Supine   Other Supine Knee/Hip Exercises hip IR/ER in hook lying 2x5 B each direction       Knee/Hip Exercises: Sidelying   Other Sidelying Knee/Hip Exercises book stretch to the left - not tolerated initially - tolerated mildly well after manual x5       Manual Therapy   Manual Therapy Soft tissue mobilization;Joint mobilization    Manual therapy comments Manual therapy completed separate from all other activity     Joint Mobilization  long axis traction of left hip grade II/III with ROM      Soft tissue mobilization STM to left proximal adductors, then to left sacral border  - not tolerated well - transitioned to pecussion gun which was toelrated very well.                     PT Short Term Goals - 07/31/20 1630      PT SHORT TERM GOAL #1   Title Patient will be independent with initial HEP and self-management strategies to improve functional outcomes    Time 2    Period Weeks    Status New    Target Date 08/17/20             PT Long Term Goals - 07/31/20 1631      PT LONG TERM GOAL #1   Title Patient will improve FOTO score by 10% to indicate improvement in functional outcomes    Time 4    Period Weeks    Status New    Target Date 08/31/20      PT LONG TERM GOAL #2   Title Patient will report at least 75% overall improvement in subjective complaint to indicate improvement in ability to perform ADLs.    Time 4    Period Weeks    Status New    Target Date 08/31/20      PT LONG TERM GOAL #3    Title Patient will be able to sit or stand > 45 minutes with pain not to exceed 3/10 in lumbar/ SI area to improve ability to perform cooking, grooming, and work tasks.    Time 4    Period Weeks    Status New                 Plan - 08/07/20 1532    Clinical Impression Statement Patient with high levels of pain. Trialed traction and percussion gun which was tolerated moderately well. Pain in butt (rectum) noted end of session with transition back to sitting. Educated on deep breathing and focus on long exhale. Tolerated hip mobility well but overall continues to have pain. Will trial core activation next session.    Examination-Activity Limitations Stairs;Squat;Sleep;Locomotion Level;Stand;Lift;Carry;Sit    Examination-Participation Restrictions Art gallery manager;Yard Work;Laundry;Occupation    Stability/Clinical Decision Making Stable/Uncomplicated    Rehab Potential Fair    PT Frequency 2x / week    PT Duration 4 weeks    PT Treatment/Interventions ADLs/Self Care Home Management;Aquatic Therapy;Biofeedback;Cryotherapy;Fluidtherapy;Parrafin;Patient/family education;Therapeutic activities;Manual techniques;Neuromuscular re-education;Passive range of motion;Dry needling;Functional mobility training;Stair training;Moist Heat;Traction;Ultrasound;Iontophoresis 4mg /ml Dexamethasone;DME Instruction;Gait training;Balance training;Contrast Bath;Electrical Stimulation;Taping;Compression bandaging;Orthotic Fit/Training;Therapeutic exercise;Joint Manipulations;Vasopneumatic Device;Scar mobilization;Vestibular;Visual/perceptual remediation/compensation;Spinal Manipulations;Energy conservation;Splinting    PT Next Visit Plan TRA activation, follow up post pelvic floor consult; Assess response to HEP hip isometric MET .Continue low grade joint mobs otherwise. Manuals PRN. Progress hip and core strength as tolerated    PT Home Exercise Plan 08/02/20: hip abd/add iso, bridge    Consulted and  Agree with Plan of Care Patient           Patient will benefit from skilled therapeutic intervention in order to improve the following deficits and impairments:  Pain, Improper body mechanics, Increased fascial restricitons, Decreased activity tolerance, Difficulty walking, Impaired flexibility, Decreased strength, Decreased range of motion, Postural dysfunction, Increased muscle spasms, Abnormal gait  Visit Diagnosis: Sacrococcygeal disorders, not elsewhere classified     Problem List Patient Active Problem List   Diagnosis Date Noted  . Atypical pneumonia  08/14/2018  . Nausea vomiting and diarrhea 08/14/2018  . Hyponatremia 08/14/2018  . Hypokalemia 08/14/2018  . Reactive thrombocytosis 08/14/2018  . Anxiety 08/14/2018  . Loss of weight 06/15/2018  . Abdominal pain 03/11/2018  . Loose stools 03/11/2018   3:33 PM, 08/07/20 Jerene Pitch, DPT Physical Therapy with [facility]  [phone] office  [place] [address] [place], [place], [address] Phone: [phone]   Fax:  [phone]  Name: ANITRA DOXTATER MRN: [id number] Date of Birth: 12/24/1965

## 2020-08-09 ENCOUNTER — Ambulatory Visit (HOSPITAL_COMMUNITY): Payer: 59 | Admitting: Physical Therapy

## 2020-08-09 ENCOUNTER — Encounter (HOSPITAL_COMMUNITY): Payer: Self-pay | Admitting: Physical Therapy

## 2020-08-09 ENCOUNTER — Other Ambulatory Visit: Payer: Self-pay

## 2020-08-09 DIAGNOSIS — M533 Sacrococcygeal disorders, not elsewhere classified: Secondary | ICD-10-CM

## 2020-08-09 NOTE — Therapy (Signed)
[place], [place], [address] Phone: [phone]   Fax:  [phone]  Physical Therapy Treatment  Patient Details  Name: KEMA SANTAELLA MRN: [id number] Date of Birth: Dec 18, 1965 Referring Provider (PT): Melina Schools MD    Encounter Date: 08/09/2020   PT End of Session - 08/09/20 1318    Visit Number 4    Number of Visits 8    Date for PT Re-Evaluation 08/31/20    Authorization Type [place] - Visit Number 60    Authorization - Number of Visits 90    PT Start Time 7169    PT Stop Time 1356    PT Time Calculation (min) 38 min    Activity Tolerance Patient limited by pain;Patient tolerated treatment well    Behavior During Therapy [facility] for tasks assessed/performed           Past Medical History:  Diagnosis Date  . Anxiety   . Arthritis    patient reported  . Depression   . Hypertension   . IBS (irritable bowel syndrome)   . 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 APPENDECTOMY    . CESAREAN SECTION    . COLONOSCOPY N/A 05/04/2018   Procedure: COLONOSCOPY;  Surgeon: Daneil Dolin, MD;  [facility];  Service: Endoscopy;  Laterality: N/A;  2:00pm  . PILONIDAL CYST EXCISION    . ROTATOR CUFF REPAIR Right     There were no vitals filed for this visit.   Subjective Assessment - 08/09/20 1327    Subjective States that she was really sore after last session and then woke up yesterday and felt good. States that she went fishing for 2 hours and was still feeling pretty good all day. States yesterday for a little while she felt like she was close to being "real" again. States that she feels like it is a little spastic today and 3/10 pain  with shooting pain from low back to hip. Reports no difference in the urge to go to the bathroom with transitional movements.     Currently in Pain? Yes    Pain Score 3     Pain Location Hip    Pain Orientation Left;Lateral    Pain Descriptors / Indicators Aching              [facility] PT Assessment - 08/09/20 0001      Assessment   Medical Diagnosis LT SI joint pain     Referring Provider (PT) Melina Schools MD                          [facility] Adult PT Treatment/Exercise - 08/09/20 0001      Knee/Hip Exercises: Sidelying   Other Sidelying Knee/Hip Exercises right s/l kegels - full motion - prior explanation- x10 holds at top, anal sphincter isometric  x10 --> then performed in seated position x10 Each - slow and controlled      Manual Therapy   Manual Therapy Soft tissue mobilization    Manual therapy comments Manual therapy completed separate from all other activity     Soft tissue mobilization percussion gun to left glut/hip, lateral sacral border in s/l round ball attachment - 10 minutes  PT Education - 08/09/20 1358    Education Details in pelvic anatomy, how muscles can help with current symptoms and function and how they are related to injury    Person(s) Educated Patient    Methods Explanation    Comprehension Verbalized understanding            PT Short Term Goals - 07/31/20 1630      PT SHORT TERM GOAL #1   Title Patient will be independent with initial HEP and self-management strategies to improve functional outcomes    Time 2    Period Weeks    Status New    Target Date 08/17/20             PT Long Term Goals - 07/31/20 1631      PT LONG TERM GOAL #1   Title Patient will improve FOTO score by 10% to indicate improvement in functional outcomes    Time 4    Period Weeks    Status New    Target Date 08/31/20      PT LONG TERM GOAL #2   Title Patient will report at least 75% overall improvement in subjective complaint to indicate improvement in ability to perform ADLs.    Time 4    Period Weeks    Status New    Target Date 08/31/20       PT LONG TERM GOAL #3   Title Patient will be able to sit or stand > 45 minutes with pain not to exceed 3/10 in lumbar/ SI area to improve ability to perform cooking, grooming, and work tasks.    Time 4    Period Weeks    Status New                 Plan - 08/09/20 1319    Clinical Impression Statement Spasms initially noted with Kegels and anal sphincter isometrics. Transitioned to supine position for anal spinchter isometrics and no spasming was reported in this position. Explained to patient rationale and anatomy of exercises. Patient tolerated exercises well, with transition from supine to seated position, patient reported similar pain when performing anal sphincter exercises in seated position that she has with orgasming (painful). Educated patient on how this is related and how to perform exercise in pain free ROM/intensity. Instructed patient to perform kegels and anal sphincter exercise 10x/day daily. Will follow up with this next session. Reduced pain noted end of session 1/10.    Examination-Activity Limitations Stairs;Squat;Sleep;Locomotion Level;Stand;Lift;Carry;Sit    Examination-Participation Restrictions Art gallery manager;Yard Work;Laundry;Occupation    Stability/Clinical Decision Making Stable/Uncomplicated    Rehab Potential Fair    PT Frequency 2x / week    PT Duration 4 weeks    PT Treatment/Interventions ADLs/Self Care Home Management;Aquatic Therapy;Biofeedback;Cryotherapy;Fluidtherapy;Parrafin;Patient/family education;Therapeutic activities;Manual techniques;Neuromuscular re-education;Passive range of motion;Dry needling;Functional mobility training;Stair training;Moist Heat;Traction;Ultrasound;Iontophoresis 4mg /ml Dexamethasone;DME Instruction;Gait training;Balance training;Contrast Bath;Electrical Stimulation;Taping;Compression bandaging;Orthotic Fit/Training;Therapeutic exercise;Joint Manipulations;Vasopneumatic Device;Scar  mobilization;Vestibular;Visual/perceptual remediation/compensation;Spinal Manipulations;Energy conservation;Splinting    PT Next Visit Plan TRA activation, seated ball work; percussion gun to sacrospinous ligament and lateral border of sacrum Left, Continue low grade joint mobs otherwise. Manuals PRN. Progress hip and core strength as tolerated    PT Home Exercise Plan 08/02/20: hip abd/add iso, bridge; 9/30 kegels, anal sphincter isometric    Consulted and Agree with Plan of Care Patient           Patient will benefit from skilled therapeutic intervention in order to improve the following deficits and impairments:  Pain, Improper body mechanics, Increased  fascial restricitons, Decreased activity tolerance, Difficulty walking, Impaired flexibility, Decreased strength, Decreased range of motion, Postural dysfunction, Increased muscle spasms, Abnormal gait  Visit Diagnosis: Sacrococcygeal disorders, not elsewhere classified     Problem List Patient Active Problem List   Diagnosis Date Noted  . Atypical pneumonia 08/14/2018  . Nausea vomiting and diarrhea 08/14/2018  . Hyponatremia 08/14/2018  . Hypokalemia 08/14/2018  . Reactive thrombocytosis 08/14/2018  . Anxiety 08/14/2018  . Loss of weight 06/15/2018  . Abdominal pain 03/11/2018  . Loose stools 03/11/2018   2:04 PM, 08/09/20 Jerene Pitch, DPT Physical Therapy with [facility]  [phone] office  [place] [address] [place], [place], [address] Phone: [phone]   Fax:  [phone]  Name: OVAL MORALEZ MRN: [id number] Date of Birth: [birth year]

## 2020-08-14 ENCOUNTER — Other Ambulatory Visit: Payer: Self-pay

## 2020-08-14 ENCOUNTER — Encounter (HOSPITAL_COMMUNITY): Payer: Self-pay | Admitting: Physical Therapy

## 2020-08-14 ENCOUNTER — Ambulatory Visit (HOSPITAL_COMMUNITY): Payer: 59 | Attending: Orthopedic Surgery | Admitting: Physical Therapy

## 2020-08-14 DIAGNOSIS — R293 Abnormal posture: Secondary | ICD-10-CM | POA: Diagnosis present

## 2020-08-14 DIAGNOSIS — R29898 Other symptoms and signs involving the musculoskeletal system: Secondary | ICD-10-CM | POA: Insufficient documentation

## 2020-08-14 DIAGNOSIS — M6281 Muscle weakness (generalized): Secondary | ICD-10-CM | POA: Diagnosis present

## 2020-08-14 DIAGNOSIS — M533 Sacrococcygeal disorders, not elsewhere classified: Secondary | ICD-10-CM | POA: Diagnosis not present

## 2020-08-14 NOTE — Therapy (Signed)
[place], [place], [address] Phone: [phone]   Fax:  [phone]  Physical Therapy Treatment  Patient Details  Name: Dawn Edwards MRN: [id number] Date of Birth: [birth year] Referring Provider (PT): Melina Schools MD    Encounter Date: 08/14/2020   PT End of Session - 08/14/20 0959    Visit Number 5    Number of Visits 8    Date for PT Re-Evaluation 08/31/20    Authorization Type [place] - Visit Number 61    Authorization - Number of Visits 90    PT Start Time 1000    PT Stop Time 1040    PT Time Calculation (min) 40 min    Activity Tolerance Patient limited by pain;Patient tolerated treatment well    Behavior During Therapy [facility] for tasks assessed/performed           Past Medical History:  Diagnosis Date  . Anxiety   . Arthritis    patient reported  . Depression   . Hypertension   . IBS (irritable bowel syndrome)   . 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 APPENDECTOMY    . CESAREAN SECTION    . COLONOSCOPY N/A 05/04/2018   Procedure: COLONOSCOPY;  Surgeon: Daneil Dolin, MD;  [facility];  Service: Endoscopy;  Laterality: N/A;  2:00pm  . PILONIDAL CYST EXCISION    . ROTATOR CUFF REPAIR Right     There were no vitals filed for this visit.   Subjective Assessment - 08/14/20 1006    Subjective States she has been feeling surprisingly good. States that she has had improved pain in her hip but has had a slight increase in pain below her knee states her left ankle and calf feel achy. States that she has not had any pain pills today but did need a couple over the weekend. States she has been doing her isometrics but no big difference noted in urge to go. States back doesn't feel as spastic as it was.    Currently in Pain? Yes    Pain Score 1      Pain Location Back    Pain Orientation Left;Lower    Pain Descriptors / Indicators Dull    Pain Type Chronic pain              [facility] PT Assessment - 08/14/20 0001      Assessment   Medical Diagnosis LT SI joint pain     Referring Provider (PT) Melina Schools MD                          [facility] Adult PT Treatment/Exercise - 08/14/20 0001      Knee/Hip Exercises: Seated   Other Seated Knee/Hip Exercises seated on exercise ball - 6 minutes bounces, isometric holds - increase in pain afterwards      Knee/Hip Exercises: Sidelying   Clams 3x10 L small ROM     Other Sidelying Knee/Hip Exercises right s/l hip add isometric x15 5" holds, deep breathing focus on exhale x15 5" holds       Manual Therapy   Manual Therapy Soft tissue mobilization    Manual therapy comments Manual therapy completed separate from all other activity     Soft tissue mobilization percussion gun to left glute/hip  and sacurm - big ball attachment 10 minutes                  PT Education - 08/14/20 1032    Education Details educated patient in typical length of time for anticipated strength gains. on anatomy, pirifomis location.    Person(s) Educated Patient    Methods Explanation    Comprehension Verbalized understanding            PT Short Term Goals - 07/31/20 1630      PT SHORT TERM GOAL #1   Title Patient will be independent with initial HEP and self-management strategies to improve functional outcomes    Time 2    Period Weeks    Status New    Target Date 08/17/20             PT Long Term Goals - 07/31/20 1631      PT LONG TERM GOAL #1   Title Patient will improve FOTO score by 10% to indicate improvement in functional outcomes    Time 4    Period Weeks    Status New    Target Date 08/31/20      PT LONG TERM GOAL #2   Title Patient will report at least 75% overall improvement in subjective complaint to indicate improvement in ability to perform ADLs.    Time 4     Period Weeks    Status New    Target Date 08/31/20      PT LONG TERM GOAL #3   Title Patient will be able to sit or stand > 45 minutes with pain not to exceed 3/10 in lumbar/ SI area to improve ability to perform cooking, grooming, and work tasks.    Time 4    Period Weeks    Status New                 Plan - 08/14/20 0959    Clinical Impression Statement Continues to tolerate percussion gun well with no pain reported afterwards. Trialed seated ball exercises and this felly better on the buttocks then the chair (overall pressure) but increased deep symptoms with patient reporting increased pain afterwards. Transitioned to percussion gun and symptoms abolished. Continued to progress table exercises as tolerated. No pain noted end of session.    Examination-Activity Limitations Stairs;Squat;Sleep;Locomotion Level;Stand;Lift;Carry;Sit    Examination-Participation Restrictions Art gallery manager;Yard Work;Laundry;Occupation    Stability/Clinical Decision Making Stable/Uncomplicated    Rehab Potential Fair    PT Frequency 2x / week    PT Duration 4 weeks    PT Treatment/Interventions ADLs/Self Care Home Management;Aquatic Therapy;Biofeedback;Cryotherapy;Fluidtherapy;Parrafin;Patient/family education;Therapeutic activities;Manual techniques;Neuromuscular re-education;Passive range of motion;Dry needling;Functional mobility training;Stair training;Moist Heat;Traction;Ultrasound;Iontophoresis 4mg /ml Dexamethasone;DME Instruction;Gait training;Balance training;Contrast Bath;Electrical Stimulation;Taping;Compression bandaging;Orthotic Fit/Training;Therapeutic exercise;Joint Manipulations;Vasopneumatic Device;Scar mobilization;Vestibular;Visual/perceptual remediation/compensation;Spinal Manipulations;Energy conservation;Splinting    PT Next Visit Plan TRA activation, seated ball work; percussion gun to sacrospinous ligament and lateral border of sacrum Left, Continue low grade joint mobs  otherwise. Manuals PRN. Progress hip and core strength as tolerated    PT Home Exercise Plan 08/02/20: hip abd/add iso, bridge; 9/30 kegels, anal sphincter isometric    Consulted and Agree with Plan of Care Patient           Patient will benefit from skilled therapeutic intervention in order to improve the following deficits and impairments:  Pain, Improper body mechanics, Increased fascial restricitons, Decreased activity tolerance, Difficulty walking, Impaired flexibility, Decreased strength, Decreased range of motion, Postural dysfunction, Increased muscle spasms, Abnormal gait  Visit Diagnosis:  Sacrococcygeal disorders, not elsewhere classified  Abnormal posture     Problem List Patient Active Problem List   Diagnosis Date Noted  . Atypical pneumonia 08/14/2018  . Nausea vomiting and diarrhea 08/14/2018  . Hyponatremia 08/14/2018  . Hypokalemia 08/14/2018  . Reactive thrombocytosis 08/14/2018  . Anxiety 08/14/2018  . Loss of weight 06/15/2018  . Abdominal pain 03/11/2018  . Loose stools 03/11/2018   10:42 AM, 08/14/20 Jerene Pitch, DPT Physical Therapy with [facility]  [phone] office  [place] [address] [place], [place], [address] Phone: [phone]   Fax:  [phone]  Name: Dawn Edwards MRN: [id number] Date of Birth: [birth year]

## 2020-08-16 ENCOUNTER — Other Ambulatory Visit: Payer: Self-pay

## 2020-08-16 ENCOUNTER — Ambulatory Visit (HOSPITAL_COMMUNITY): Payer: 59 | Admitting: Physical Therapy

## 2020-08-16 ENCOUNTER — Encounter (HOSPITAL_COMMUNITY): Payer: Self-pay | Admitting: Physical Therapy

## 2020-08-16 DIAGNOSIS — M533 Sacrococcygeal disorders, not elsewhere classified: Secondary | ICD-10-CM

## 2020-08-16 NOTE — Therapy (Signed)
[place], [place], [address] Phone: [phone]   Fax:  [phone]  Physical Therapy Treatment  Patient Details  Name: Dawn Edwards MRN: [id number] Date of Birth: 1966/05/04 Referring Provider (PT): Melina Schools MD    Encounter Date: 08/16/2020   PT End of Session - 08/16/20 1002    Visit Number 6    Number of Visits 8    Date for PT Re-Evaluation 08/31/20    Authorization Type [place] - Visit Number 17    Authorization - Number of Visits 90    PT Start Time 1001    PT Stop Time 1040    PT Time Calculation (min) 39 min    Activity Tolerance Patient limited by pain;Patient tolerated treatment well    Behavior During Therapy [facility] for tasks assessed/performed           Past Medical History:  Diagnosis Date  . Anxiety   . Arthritis    patient reported  . Depression   . Hypertension   . IBS (irritable bowel syndrome)   . 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 APPENDECTOMY    . CESAREAN SECTION    . COLONOSCOPY N/A 05/04/2018   Procedure: COLONOSCOPY;  Surgeon: Daneil Dolin, MD;  [facility];  Service: Endoscopy;  Laterality: N/A;  2:00pm  . PILONIDAL CYST EXCISION    . ROTATOR CUFF REPAIR Right     There were no vitals filed for this visit.   Subjective Assessment - 08/16/20 1003    Subjective States she has been feeling pretty good. States the hip is still not spastic states she was able to clean her car out. Reports no change in the urge to go with transitional movements. States overall she feels about 60% better since the start of PT. States she has yet to get her massager as it is stuck in transient. States she fell on the left arm as her dog stopped in front of her. States she has a carpet burn on her left arm but no other symptoms reported.  States she has no difficulties with exercises as home.    Currently in Pain? Yes    Pain Score 2     Pain Location Back    Pain Orientation Left;Lower    Pain Descriptors / Indicators Dull    Pain Type Chronic pain              [facility] PT Assessment - 08/16/20 0001      Assessment   Medical Diagnosis LT SI joint pain     Referring Provider (PT) Melina Schools MD                          [facility] Adult PT Treatment/Exercise - 08/16/20 0001      Knee/Hip Exercises: Standing   Other Standing Knee Exercises trunk/hip SB 2x5 B       Knee/Hip Exercises: Seated   Abduction/Adduction  AROM;Strengthening;4 sets;5 reps   5" holds belt   Sit to Sand 5 reps;with UE support;2 sets   with belt around knees     Knee/Hip Exercises: Supine   Other Supine Knee/Hip Exercises hamstring curls on green ball 2x10 B       Manual Therapy   Manual Therapy Soft tissue  mobilization    Manual therapy comments Manual therapy completed separate from all other activity     Soft tissue mobilization percussion gun to left glute/hip and sacurm - big ball attachment 10 minutes                    PT Short Term Goals - 07/31/20 1630      PT SHORT TERM GOAL #1   Title Patient will be independent with initial HEP and self-management strategies to improve functional outcomes    Time 2    Period Weeks    Status New    Target Date 08/17/20             PT Long Term Goals - 07/31/20 1631      PT LONG TERM GOAL #1   Title Patient will improve FOTO score by 10% to indicate improvement in functional outcomes    Time 4    Period Weeks    Status New    Target Date 08/31/20      PT LONG TERM GOAL #2   Title Patient will report at least 75% overall improvement in subjective complaint to indicate improvement in ability to perform ADLs.    Time 4    Period Weeks    Status New    Target Date 08/31/20      PT LONG TERM GOAL #3   Title Patient will be able to sit or stand > 45 minutes  with pain not to exceed 3/10 in lumbar/ SI area to improve ability to perform cooking, grooming, and work tasks.    Time 4    Period Weeks    Status New                 Plan - 08/16/20 1002    Clinical Impression Statement Trialed closed chain hip ROM and tightness and range improved but increase in leg pain noted afterwards. Transitioned to percussion gun and followed along sciatic nerve which patient tolerated well reporting no pain afterwards. Added hamstring curls secondary to pain pattern going down back of the leg and to help with possible nerve entrapment. Patient reported initially popping in left hip with this exercise but with each set popping amount reduced, no pain noted with popping or with exercise.    Examination-Activity Limitations Stairs;Squat;Sleep;Locomotion Level;Stand;Lift;Carry;Sit    Examination-Participation Restrictions Art gallery manager;Yard Work;Laundry;Occupation    Stability/Clinical Decision Making Stable/Uncomplicated    Rehab Potential Fair    PT Frequency 2x / week    PT Duration 4 weeks    PT Treatment/Interventions ADLs/Self Care Home Management;Aquatic Therapy;Biofeedback;Cryotherapy;Fluidtherapy;Parrafin;Patient/family education;Therapeutic activities;Manual techniques;Neuromuscular re-education;Passive range of motion;Dry needling;Functional mobility training;Stair training;Moist Heat;Traction;Ultrasound;Iontophoresis 4mg /ml Dexamethasone;DME Instruction;Gait training;Balance training;Contrast Bath;Electrical Stimulation;Taping;Compression bandaging;Orthotic Fit/Training;Therapeutic exercise;Joint Manipulations;Vasopneumatic Device;Scar mobilization;Vestibular;Visual/perceptual remediation/compensation;Spinal Manipulations;Energy conservation;Splinting    PT Next Visit Plan percussion gun to sacrospinous ligament and lateral border of sacrum Left, hip mobility and strengthening, nerve glides, Continue low grade joint mobs otherwise. Manuals PRN.  Progress hip and core strength as tolerated    PT Home Exercise Plan 08/02/20: hip abd/add iso, bridge; 9/30 kegels, anal sphincter isometric    Consulted and Agree with Plan of Care Patient           Patient will benefit from skilled therapeutic intervention in order to improve the following deficits and impairments:  Pain, Improper body mechanics, Increased fascial restricitons, Decreased activity tolerance, Difficulty walking, Impaired flexibility, Decreased strength, Decreased range of motion, Postural dysfunction, Increased muscle spasms, Abnormal gait  Visit Diagnosis: Sacrococcygeal  disorders, not elsewhere classified     Problem List Patient Active Problem List   Diagnosis Date Noted  . Atypical pneumonia 08/14/2018  . Nausea vomiting and diarrhea 08/14/2018  . Hyponatremia 08/14/2018  . Hypokalemia 08/14/2018  . Reactive thrombocytosis 08/14/2018  . Anxiety 08/14/2018  . Loss of weight 06/15/2018  . Abdominal pain 03/11/2018  . Loose stools 03/11/2018   10:40 AM, 08/16/20 Jerene Pitch, DPT Physical Therapy with [facility]  [phone] office  [place] [address] [place], [place], [address] Phone: [phone]   Fax:  [phone]  Name: Dawn Edwards MRN: [id number] Date of Birth: Dec 05, 1965

## 2020-08-20 ENCOUNTER — Encounter (HOSPITAL_COMMUNITY): Payer: Self-pay | Admitting: Physical Therapy

## 2020-08-20 ENCOUNTER — Ambulatory Visit (HOSPITAL_COMMUNITY): Payer: 59 | Admitting: Physical Therapy

## 2020-08-20 ENCOUNTER — Other Ambulatory Visit: Payer: Self-pay

## 2020-08-20 DIAGNOSIS — M533 Sacrococcygeal disorders, not elsewhere classified: Secondary | ICD-10-CM

## 2020-08-20 NOTE — Therapy (Signed)
[place], [place], [address] Phone: [phone]   Fax:  [phone]  Physical Therapy Treatment  Patient Details  Name: Dawn Edwards MRN: [id number] Date of Birth: November 11, 1965 Referring Provider (PT): Melina Schools MD    Encounter Date: 08/20/2020   PT End of Session - 08/20/20 1357    Visit Number 7    Number of Visits 8    Date for PT Re-Evaluation 08/31/20    Authorization Type [employer] Clinical biochemist - Visit Number 42    Authorization - Number of Visits 90    PT Start Time 2376    PT Stop Time 1430    PT Time Calculation (min) 41 min    Activity Tolerance Patient tolerated treatment well    Behavior During Therapy [facility] for tasks assessed/performed           Past Medical History:  Diagnosis Date  . Anxiety   . Arthritis    patient reported  . Depression   . Hypertension   . IBS (irritable bowel syndrome)   . 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 APPENDECTOMY    . CESAREAN SECTION    . COLONOSCOPY N/A 05/04/2018   Procedure: COLONOSCOPY;  Surgeon: Daneil Dolin, MD;  [facility];  Service: Endoscopy;  Laterality: N/A;  2:00pm  . PILONIDAL CYST EXCISION    . ROTATOR CUFF REPAIR Right     There were no vitals filed for this visit.   Subjective Assessment - 08/20/20 1354    Subjective Patient says she is doing much better overall. Says she is taking a new medication and that therapy has been helping. Says she took her dog for a walk the other day and had some pain in her groin, but not bad. Says pain is about 2-3 at worst lately.    Currently in Pain? Yes    Pain Score 1     Pain Location Hip    Pain Orientation Left    Pain Descriptors / Indicators Dull;Discomfort    Pain Type Chronic pain    Pain Onset More than a month ago    Pain Frequency Constant                              [facility] Adult PT Treatment/Exercise - 08/20/20 0001      Knee/Hip Exercises: Standing   Heel Raises Both;1 set;10 reps    Other Standing Knee Exercises sidestepping with RTB  at mat 5 RT       Knee/Hip Exercises: Seated   Sit to Sand 10 reps   with red band at knees      Knee/Hip Exercises: Supine   Bridges Both;1 set;10 reps    Straight Leg Raises Both;1 set;10 reps    Other Supine Knee/Hip Exercises Ab set 10 x 5"       Manual Therapy   Manual Therapy Soft tissue mobilization    Manual therapy comments Manual therapy completed separate from all other activity     Soft tissue mobilization percussion gun to left glute/hip and sacurm - big ball attachment                    PT Short Term Goals - 07/31/20 1630  PT SHORT TERM GOAL #1   Title Patient will be independent with initial HEP and self-management strategies to improve functional outcomes    Time 2    Period Weeks    Status New    Target Date 08/17/20             PT Long Term Goals - 07/31/20 1631      PT LONG TERM GOAL #1   Title Patient will improve FOTO score by 10% to indicate improvement in functional outcomes    Time 4    Period Weeks    Status New    Target Date 08/31/20      PT LONG TERM GOAL #2   Title Patient will report at least 75% overall improvement in subjective complaint to indicate improvement in ability to perform ADLs.    Time 4    Period Weeks    Status New    Target Date 08/31/20      PT LONG TERM GOAL #3   Title Patient will be able to sit or stand > 45 minutes with pain not to exceed 3/10 in lumbar/ SI area to improve ability to perform cooking, grooming, and work tasks.    Time 4    Period Weeks    Status New                 Plan - 08/20/20 1433    Clinical Impression Statement Patient tolerated session well today. Patient able to progress hip and core strengthening exercise with no increased complaint of pain.  Added banded chair squats and banded sidestepping. Patient educated on proper form and function of all added exercise today. Ended session with message gun to LT hip area. Patient noted decreased discomfort an improved pain free hip mobility post session. Reassessment next visit, will adjust POC as indicated.    Examination-Activity Limitations Stairs;Squat;Sleep;Locomotion Level;Stand;Lift;Carry;Sit    Examination-Participation Restrictions Art gallery manager;Yard Work;Laundry;Occupation    Stability/Clinical Decision Making Stable/Uncomplicated    Rehab Potential Fair    PT Frequency 2x / week    PT Duration 4 weeks    PT Treatment/Interventions ADLs/Self Care Home Management;Aquatic Therapy;Biofeedback;Cryotherapy;Fluidtherapy;Parrafin;Patient/family education;Therapeutic activities;Manual techniques;Neuromuscular re-education;Passive range of motion;Dry needling;Functional mobility training;Stair training;Moist Heat;Traction;Ultrasound;Iontophoresis 4mg /ml Dexamethasone;DME Instruction;Gait training;Balance training;Contrast Bath;Electrical Stimulation;Taping;Compression bandaging;Orthotic Fit/Training;Therapeutic exercise;Joint Manipulations;Vasopneumatic Device;Scar mobilization;Vestibular;Visual/perceptual remediation/compensation;Spinal Manipulations;Energy conservation;Splinting    PT Next Visit Plan Reassess next visit, adjust POC as indicated    PT Home Exercise Plan 08/02/20: hip abd/add iso, bridge; 9/30 kegels, anal sphincter isometric    Consulted and Agree with Plan of Care Patient           Patient will benefit from skilled therapeutic intervention in order to improve the following deficits and impairments:  Pain, Improper body mechanics, Increased fascial restricitons, Decreased activity tolerance, Difficulty walking, Impaired flexibility, Decreased strength, Decreased range of motion, Postural dysfunction, Increased muscle spasms, Abnormal gait  Visit  Diagnosis: Sacrococcygeal disorders, not elsewhere classified     Problem List Patient Active Problem List   Diagnosis Date Noted  . Atypical pneumonia 08/14/2018  . Nausea vomiting and diarrhea 08/14/2018  . Hyponatremia 08/14/2018  . Hypokalemia 08/14/2018  . Reactive thrombocytosis 08/14/2018  . Anxiety 08/14/2018  . Loss of weight 06/15/2018  . Abdominal pain 03/11/2018  . Loose stools 03/11/2018    2:35 PM, 08/20/20 Josue Hector PT DPT  Physical Therapist with [facility]  [phone] [place],  [place], [address] Phone: [phone]   Fax:  [phone]  Name: Dawn Edwards MRN: [id number] Date of Birth: 1966/05/06

## 2020-08-21 ENCOUNTER — Encounter (HOSPITAL_COMMUNITY): Payer: Self-pay | Admitting: Physical Therapy

## 2020-08-21 ENCOUNTER — Ambulatory Visit (HOSPITAL_COMMUNITY): Payer: 59 | Admitting: Physical Therapy

## 2020-08-21 DIAGNOSIS — M533 Sacrococcygeal disorders, not elsewhere classified: Secondary | ICD-10-CM

## 2020-08-21 NOTE — Therapy (Signed)
[place] [address] [place], [place], [address] Phone: [phone]   Fax:  [phone]  Physical Therapy Treatment  Patient Details  Name: Dawn Edwards MRN: [id number] Date of Birth: 03/20/1966 Referring Provider (PT): Melina Schools MD   Progress Note Reporting Period 07/31/20 to 08/21/20  See note below for Objective Data and Assessment of Progress/Goals.      Encounter Date: 08/21/2020   PT End of Session - 08/21/20 1128    Visit Number 8    Number of Visits 10    Date for PT Re-Evaluation 09/07/20    Authorization Type Psychologist, clinical - Visit Number [id number]    Authorization - Number of Visits 90    PT Start Time 1120    PT Stop Time 1150   patient request early end per time constraints   PT Time Calculation (min) 30 min    Activity Tolerance Patient tolerated treatment well    Behavior During Therapy [facility] for tasks assessed/performed           Past Medical History:  Diagnosis Date  . Anxiety   . Arthritis    patient reported  . Depression   . Hypertension   . IBS (irritable bowel syndrome)   . 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 APPENDECTOMY    . CESAREAN SECTION    . COLONOSCOPY N/A 05/04/2018   Procedure: COLONOSCOPY;  Surgeon: Daneil Dolin, MD;  [facility];  Service: Endoscopy;  Laterality: N/A;  2:00pm  . PILONIDAL CYST EXCISION    . ROTATOR CUFF REPAIR Right     There were no vitals filed for this visit.   Subjective Assessment - 08/21/20 1125    Subjective Patient says she is doing much better overall. Currently reports 85% improvement. Says she still has low level pain, but is not near as bad as before. Feels combination of therapy and new medication has been very helpful.    Limitations Sitting;Lifting;Standing;Walking;House hold activities    Patient  Stated Goals Less pain    Currently in Pain? Yes    Pain Score 1     Pain Location Hip    Pain Orientation Left    Pain Descriptors / Indicators Dull;Discomfort    Pain Type Chronic pain    Pain Onset More than a month ago    Pain Frequency Constant    Aggravating Factors  standing, walking, lifting    Pain Relieving Factors meds, exercise    Effect of Pain on Daily Activities Limits              [facility] PT Assessment - 08/21/20 0001      Assessment   Medical Diagnosis LT SI joint pain     Referring Provider (PT) Melina Schools MD     Onset Date/Surgical Date 01/30/20    Prior Therapy Yes       Precautions   Precautions None      Restrictions   Weight Bearing Restrictions No      Balance Screen   Has the patient fallen in the past 6 months No      [place] residence      Prior Function   Level of Independence Independent      Cognition   Overall Cognitive Status Within Functional Limits for  tasks assessed      Observation/Other Assessments   Focus on Therapeutic Outcomes (FOTO)  30% limited    was 47% limited      AROM   Lumbar Flexion 10% limited     Lumbar Extension 75% limited     Lumbar - Right Side Bend 10% limited    50% limited    Lumbar - Left Side Bend 10% limited    75% limited    Lumbar - Right Rotation 10% limited    was 75% limited    Lumbar - Left Rotation 10% limited    was 50% limited      Transfers   Five time sit to stand comments  19.5 sec with no/ minimal use of UEs    was 26.7 sec with UEs                                PT Education - 08/21/20 1127    Education Details on reassessment findings and POC    Person(s) Educated Patient    Methods Explanation    Comprehension Verbalized understanding            PT Short Term Goals - 08/21/20 1320      PT SHORT TERM GOAL #1   Title Patient will be independent with initial HEP and self-management strategies to improve  functional outcomes    Baseline Reports compliance    Time 2    Period Weeks    Status Achieved    Target Date 08/17/20             PT Long Term Goals - 08/21/20 1321      PT LONG TERM GOAL #1   Title Patient will improve FOTO score by 10% to indicate improvement in functional outcomes    Baseline Improved by 17%    Time 4    Period Weeks    Status Achieved      PT LONG TERM GOAL #2   Title Patient will report at least 75% overall improvement in subjective complaint to indicate improvement in ability to perform ADLs.    Baseline Reports 85%    Time 4    Period Weeks    Status Achieved      PT LONG TERM GOAL #3   Title Patient will be able to sit or stand > 45 minutes with pain not to exceed 3/10 in lumbar/ SI area to improve ability to perform cooking, grooming, and work tasks.    Baseline Can sit/stand up to one hour "depending on the day" but still with mild pain    Time 4    Period Weeks    Status Partially Met                 Plan - 08/21/20 1202    Clinical Impression Statement Patient has made very good progress toward therapy goals. Patient demos overall improved tolerance to funcitonal activity and lumbar AROM. Patient with significant reported improvement in subjective and perceived function. Patient still limited in lumbar extension, functional mobility as indicated by sit to stand x 5 test, and weakness. At this time, patient would benefit from 1-2 therapy visits for HEP progression and education to transition to independance with HEP for DC from therapy. Patient agrees with this plan.    Examination-Activity Limitations Stairs;Squat;Sleep;Locomotion Level;Stand;Lift;Carry;Sit    Examination-Participation Restrictions Art gallery manager;Yard Work;Laundry;Occupation    Stability/Clinical Decision Making Stable/Uncomplicated  Rehab Potential Fair    PT Frequency 1x / week    PT Duration 2 weeks    PT Treatment/Interventions ADLs/Self Care Home  Management;Aquatic Therapy;Biofeedback;Cryotherapy;Fluidtherapy;Parrafin;Patient/family education;Therapeutic activities;Manual techniques;Neuromuscular re-education;Passive range of motion;Dry needling;Functional mobility training;Stair training;Moist Heat;Traction;Ultrasound;Iontophoresis 18m/ml Dexamethasone;DME Instruction;Gait training;Balance training;Contrast Bath;Electrical Stimulation;Taping;Compression bandaging;Orthotic Fit/Training;Therapeutic exercise;Joint Manipulations;Vasopneumatic Device;Scar mobilization;Vestibular;Visual/perceptual remediation/compensation;Spinal Manipulations;Energy conservation;Splinting    PT Next Visit Plan Continue to progress hip and core strength with focus on advancing HEP for DC in next 1-2 visits    PT Home Exercise Plan 08/02/20: hip abd/add iso, bridge; 9/30 kegels, anal sphincter isometric    Consulted and Agree with Plan of Care Patient           Patient will benefit from skilled therapeutic intervention in order to improve the following deficits and impairments:  Pain, Improper body mechanics, Increased fascial restricitons, Decreased activity tolerance, Difficulty walking, Impaired flexibility, Decreased strength, Decreased range of motion, Postural dysfunction, Increased muscle spasms, Abnormal gait  Visit Diagnosis: Sacrococcygeal disorders, not elsewhere classified     Problem List Patient Active Problem List   Diagnosis Date Noted  . Atypical pneumonia 08/14/2018  . Nausea vomiting and diarrhea 08/14/2018  . Hyponatremia 08/14/2018  . Hypokalemia 08/14/2018  . Reactive thrombocytosis 08/14/2018  . Anxiety 08/14/2018  . Loss of weight 06/15/2018  . Abdominal pain 03/11/2018  . Loose stools 03/11/2018    1:23 PM, 08/21/20 CJosue HectorPT DPT  [facility] [phone] [address]Phone:  [phone]  Fax:  [phone] Name: Dawn BEHNKEMRN: [id number] of Birth: [birth year]

## 2020-08-28 ENCOUNTER — Other Ambulatory Visit: Payer: Self-pay

## 2020-08-28 ENCOUNTER — Ambulatory Visit (HOSPITAL_COMMUNITY): Payer: 59 | Admitting: Physical Therapy

## 2020-08-28 DIAGNOSIS — M533 Sacrococcygeal disorders, not elsewhere classified: Secondary | ICD-10-CM | POA: Diagnosis not present

## 2020-08-28 DIAGNOSIS — R293 Abnormal posture: Secondary | ICD-10-CM

## 2020-08-28 DIAGNOSIS — R29898 Other symptoms and signs involving the musculoskeletal system: Secondary | ICD-10-CM

## 2020-08-28 DIAGNOSIS — M6281 Muscle weakness (generalized): Secondary | ICD-10-CM

## 2020-08-28 NOTE — Therapy (Signed)
[place], [place], [address] Phone: [phone]   Fax:  [phone]  Physical Therapy Treatment  Patient Details  Name: Dawn Edwards MRN: [id number] Date of Birth: 08-29-1966 Referring Provider (PT): Melina Schools MD    Encounter Date: 08/28/2020   PT End of Session - 08/28/20 1106    Visit Number 9    Number of Visits 10    Date for PT Re-Evaluation 09/07/20    Authorization Type [employer] Clinical biochemist - Visit Number 36    Authorization - Number of Visits 90    PT Start Time 1055    PT Stop Time 1135    PT Time Calculation (min) 40 min    Activity Tolerance Patient tolerated treatment well    Behavior During Therapy [facility] for tasks assessed/performed           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   Right Upper Zygomatic Area TX CX3 5FU    Past Surgical History:  Procedure Laterality Date   APPENDECTOMY     CESAREAN SECTION     COLONOSCOPY N/A 05/04/2018   Procedure: COLONOSCOPY;  Surgeon: Daneil Dolin, MD;  [facility];  Service: Endoscopy;  Laterality: N/A;  2:00pm   PILONIDAL CYST EXCISION     ROTATOR CUFF REPAIR Right     There were no vitals filed for this visit.   Subjective Assessment - 08/28/20 1057    Subjective Pt is having carpal tunnel next week therefore she went off her medication which concerns her.    Limitations Sitting;Lifting;Standing;Walking;House hold activities    Patient Stated Goals Less pain    Currently in Pain? Yes    Pain Score 1     Pain Location Hip    Pain Orientation Left    Pain Descriptors / Indicators Aching    Pain Type Chronic pain    Pain Onset More than a month ago    Pain Frequency Constant    Aggravating Factors  standing, walking lifting     Pain Relieving Factors meds and exercises    Effect of Pain on Daily Activities limits    Multiple Pain Sites No                             [facility] Adult PT Treatment/Exercise - 08/28/20 0001      Knee/Hip Exercises: Standing   Heel Raises 10 reps    Forward Lunges Both;10 reps    Functional Squat 10 reps    SLS 60" B     Other Standing Knee Exercises sidestepping at linex x 3 RT       Knee/Hip Exercises: Seated   Sit to Sand 10 reps      Knee/Hip Exercises: Supine   Bridges 10 reps    Other Supine Knee/Hip Exercises isometric ab/adduction       Knee/Hip Exercises: Sidelying   Hip ABduction Both;10 reps    Clams x 10       Knee/Hip Exercises: Prone   Hip Extension Both;10 reps                    PT Short Term Goals - 08/21/20 1320  PT SHORT TERM GOAL #1   Title Patient will be independent with initial HEP and self-management strategies to improve functional outcomes    Baseline Reports compliance    Time 2    Period Weeks    Status Achieved    Target Date 08/17/20             PT Long Term Goals - 08/21/20 1321      PT LONG TERM GOAL #1   Title Patient will improve FOTO score by 10% to indicate improvement in functional outcomes    Baseline Improved by 17%    Time 4    Period Weeks    Status Achieved      PT LONG TERM GOAL #2   Title Patient will report at least 75% overall improvement in subjective complaint to indicate improvement in ability to perform ADLs.    Baseline Reports 85%    Time 4    Period Weeks    Status Achieved      PT LONG TERM GOAL #3   Title Patient will be able to sit or stand > 45 minutes with pain not to exceed 3/10 in lumbar/ SI area to improve ability to perform cooking, grooming, and work tasks.    Baseline Can sit/stand up to one hour "depending on the day" but still with mild pain    Time 4    Period Weeks    Status Partially Met                 Plan - 08/28/20 1107    Clinical  Impression Statement Increased stability exercises with good form with verbal cuing.  Pt continues to improve in pain and functional abilty.  Pt to have Carpal Tunnel surgery next Weds.    Examination-Activity Limitations Stairs;Squat;Sleep;Locomotion Level;Stand;Lift;Carry;Sit    Examination-Participation Restrictions Art gallery manager;Yard Work;Laundry;Occupation    Stability/Clinical Decision Making Stable/Uncomplicated    Rehab Potential Fair    PT Frequency 1x / week    PT Duration 2 weeks    PT Treatment/Interventions ADLs/Self Care Home Management;Aquatic Therapy;Biofeedback;Cryotherapy;Fluidtherapy;Parrafin;Patient/family education;Therapeutic activities;Manual techniques;Neuromuscular re-education;Passive range of motion;Dry needling;Functional mobility training;Stair training;Moist Heat;Traction;Ultrasound;Iontophoresis 4mg /ml Dexamethasone;DME Instruction;Gait training;Balance training;Contrast Bath;Electrical Stimulation;Taping;Compression bandaging;Orthotic Fit/Training;Therapeutic exercise;Joint Manipulations;Vasopneumatic Device;Scar mobilization;Vestibular;Visual/perceptual remediation/compensation;Spinal Manipulations;Energy conservation;Splinting    PT Next Visit Plan Continue to progress hip and core strength with focus on advancing HEP for DC in next 1-2 visits    PT Home Exercise Plan 08/02/20: hip abd/add iso, bridge; 9/30 kegels, anal sphincter isometric; 10/19:  heel raises. functional squat and sidesteping.    Consulted and Agree with Plan of Care Patient           Patient will benefit from skilled therapeutic intervention in order to improve the following deficits and impairments:  Pain, Improper body mechanics, Increased fascial restricitons, Decreased activity tolerance, Difficulty walking, Impaired flexibility, Decreased strength, Decreased range of motion, Postural dysfunction, Increased muscle spasms, Abnormal gait  Visit Diagnosis: Abnormal posture  Other  symptoms and signs involving the musculoskeletal system  Muscle weakness (generalized)     Problem List Patient Active Problem List   Diagnosis Date Noted   Atypical pneumonia 08/14/2018   Nausea vomiting and diarrhea 08/14/2018   Hyponatremia 08/14/2018   Hypokalemia 08/14/2018   Reactive thrombocytosis 08/14/2018   Anxiety 08/14/2018   Loss of weight 06/15/2018   Abdominal pain 03/11/2018   Loose stools 03/11/2018   Rayetta Humphrey, PT CLT [phone] 08/28/2020, 11:34 AM  [facility] [address]  [place], [place], [address] Phone: [phone]   Fax:  [phone]  Name: DEVYNN HESSLER MRN: [id number] Date of Birth: January 02, 1966

## 2020-09-03 ENCOUNTER — Encounter (HOSPITAL_COMMUNITY): Payer: Self-pay | Admitting: Physical Therapy

## 2020-09-03 ENCOUNTER — Telehealth (HOSPITAL_COMMUNITY): Payer: Self-pay | Admitting: Physical Therapy

## 2020-09-03 NOTE — Therapy (Signed)
[place], [place], [address] Phone: [phone]   Fax:  [phone]  Patient Details  Name: Dawn Edwards MRN: [id number] Date of Birth: 11-09-1966 Referring Provider:  No ref. provider found  Encounter Date: 09/03/2020    PHYSICAL THERAPY DISCHARGE SUMMARY  Visits from Start of Care: 9  Current functional level related to goals / functional outcomes: PT called requested to be discharge as she is painfree   Remaining deficits: none   Education / Equipment: HEP Plan: Patient agrees to discharge.  Patient goals were met. Patient is being discharged due to being pleased with the current functional level.  ?????     Rayetta Humphrey, PT CLT [phone]  09/03/2020, 12:27 PM  [facility] [address] [place], [place], [address] Phone: [phone]   Fax:  [phone]

## 2020-09-03 NOTE — Telephone Encounter (Signed)
Pt requested to be d/c-she is doing fine

## 2020-09-04 ENCOUNTER — Ambulatory Visit (HOSPITAL_COMMUNITY): Payer: 59 | Admitting: Physical Therapy

## 2020-09-05 ENCOUNTER — Other Ambulatory Visit: Payer: Self-pay

## 2020-12-18 ENCOUNTER — Ambulatory Visit
Admission: EM | Admit: 2020-12-18 | Discharge: 2020-12-18 | Disposition: A | Discharge status: Home / Self Care | Payer: 59 | Attending: Emergency Medicine | Admitting: Emergency Medicine

## 2020-12-18 ENCOUNTER — Encounter: Payer: Self-pay | Admitting: Emergency Medicine

## 2020-12-18 ENCOUNTER — Other Ambulatory Visit: Payer: Self-pay

## 2020-12-18 DIAGNOSIS — L0211 Cutaneous abscess of neck: Secondary | ICD-10-CM | POA: Diagnosis not present

## 2020-12-18 MED ORDER — CEPHALEXIN 500 MG PO CAPS: 500.0000 mg | ORAL_CAPSULE | Freq: Four times a day (QID) | ORAL | 0 refills | Status: DC

## 2020-12-18 MED ORDER — CEFTRIAXONE SODIUM 500 MG IJ SOLR
1000.0000 mg | Freq: Once | INTRAMUSCULAR | Status: AC
  Administered 2020-12-18: 1000 mg via INTRAMUSCULAR

## 2020-12-18 NOTE — ED Provider Notes (Signed)
[place]   [id number] 12/18/20 Arrival Time: 1011   RD:EYCXKGY  SUBJECTIVE:  Dawn Edwards is a 55 y.o. female who presented to the urgent care with a complaint of abscess to posterior  neck for the past 1 month.  Denies any precipitating event.  States she has tried antibiotic with no relief.  Denies alleviating or aggravating factors.  Reports similar symptoms in the past that improved with incision and drainage.  Denies chills, fever, nausea, vomiting, diarrhea..   ROS: As per HPI.  All other pertinent ROS negative.     Past Medical History:  Diagnosis Date  . Anxiety   . Arthritis    patient reported  . Depression   . Hypertension   . IBS (irritable bowel syndrome)   . 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 APPENDECTOMY    . CESAREAN SECTION    . COLONOSCOPY N/A 05/04/2018   Procedure: COLONOSCOPY;  Surgeon: Daneil Dolin, MD;  Location: [facility];  Service: Endoscopy;  Laterality: N/A;  2:00pm  . PILONIDAL CYST EXCISION    . ROTATOR CUFF REPAIR Right    Allergies  Allergen Reactions  . Adhesive [Tape]   . Phenergan [Promethazine Hcl]     Causes Restless Legs   No current facility-administered medications on file prior to encounter.   Current Outpatient Medications on File Prior to Encounter  Medication Sig Dispense Refill  . ALPRAZolam (XANAX) 0.5 MG tablet alprazolam 0.5 mg tablet  TAKE 1 TO 2 TABLETS BY MOUTH AT BEDTIME AND TWICE DAILY FOR PANIC IF NEEDED    . diclofenac (VOLTAREN) 75 MG EC tablet Take 75 mg by mouth 2 (two) times daily.    Marland Kitchen HYDROcodone-acetaminophen (NORCO/VICODIN) 5-325 MG tablet TAKE 1 TABLET BY MOUTH EVERY 8 HOURS AS NEEDED FOR PAIN FOR 7 DAYS    . methocarbamol (ROBAXIN) 500 MG tablet Take 500 mg by mouth every 8 (eight) hours.    . mupirocin ointment (BACTROBAN) 2 % Apply 1 application topically daily.  (Patient not taking: Reported on 04/05/2020) 22 g 0   Social History   Socioeconomic History  . Marital status: Single    Spouse name: Not on file  . Number of children: Not on file  . Years of education: Not on file  . Highest education level: Not on file  Occupational History  . Not on file  Tobacco Use  . Smoking status: Current Every Day Smoker    Packs/day: 1.00    Types: Cigarettes  . Smokeless tobacco: Never Used  Vaping Use  . Vaping Use: Every day  Substance and Sexual Activity  . Alcohol use: No  . Drug use: Yes    Types: Marijuana    Comment: occasional, about 3 times a month  . Sexual activity: Not Currently  Other Topics Concern  . Not on file  Social History Narrative  . Not on file   Social Determinants of Health   Financial Resource Strain: Not on file  Food Insecurity: Not on file  Transportation Needs: Not on file  Physical Activity: Not on file  Stress: Not on file  Social Connections: Not on file  Intimate Partner Violence: Not on file   Family History  Problem Relation Age of Onset  . Gastric cancer Father   . Colon cancer Neg Hx   . Esophageal cancer Neg Hx  OBJECTIVE:  Vitals:   12/18/20 1021  BP: (!) 159/52  Pulse: 82  Temp: 98.1 F (36.7 C)  TempSrc: Oral  SpO2: 97%     General appearance: alert; no distress Heart: RRR, no rub, gallop or murmur Chest: CTA, heart sounds normal Skin:4 cm induration of her posterior neck; tender to touch; no active drainage Psychological: alert and cooperative; normal mood and affect  Procedure: Verbal consent obtained. Area over induration cleaned with betadine. Lidocaine 2% with epinephrine used to obtain local anesthesia. The most fluctuant portion of the abscess was incised with a #11 blade scalpel. Abscess cavity explored and evacuated. Loculations broken up with a curved hemostat as best as possible given patient discomfort. Cavity packed with packing material and dressed with a clean  gauze dressing. Minimal bleeding. No complications.  ASSESSMENT & PLAN:  1. Cutaneous abscess of neck     Meds ordered this encounter  Medications  . cefTRIAXone (ROCEPHIN) injection 1,000 mg  . cephALEXin (KEFLEX) 500 MG capsule    Sig: Take 1 capsule (500 mg total) by mouth 4 (four) times daily.    Dispense:  20 capsule    Refill:  0    Discharge instructions  Rocephin IM was given in office Keep dry and covered for next 24-48 hours You may then wash site daily with warm water and mild soap May Keep covered to avoid friction Take antibiotic as prescribed and to completion Return sooner or go to the ED if you have any new or worsening symptoms such as increased redness, swelling, pain, nausea, vomiting, fever, chills, etc...   Reviewed expectations re: course of current medical issues. Questions answered. Outlined signs and symptoms indicating need for more acute intervention. Patient verbalized understanding. After Visit Summary given.           Emerson Monte, FNP 12/18/20 1104

## 2020-12-18 NOTE — ED Triage Notes (Signed)
Pt states that she has had this abscess for over a month. Pt states that she finished her antibiotics for ten days. Pt states that it is still pain and uncomfortable.

## 2020-12-18 NOTE — Discharge Instructions (Signed)
Rocephin IM was given in office Keep dry and covered for next 24-48 hours You may then wash site daily with warm water and mild soap May Keep covered to avoid friction Take antibiotic as prescribed and to completion Return sooner or go to the ED if you have any new or worsening symptoms such as increased redness, swelling, pain, nausea, vomiting, fever, chills, etc..Marland Kitchen

## 2021-07-23 ENCOUNTER — Other Ambulatory Visit: Payer: Self-pay

## 2021-07-23 ENCOUNTER — Ambulatory Visit
Admission: EM | Admit: 2021-07-23 | Discharge: 2021-07-23 | Disposition: A | Discharge status: Home / Self Care | Payer: 59 | Attending: Internal Medicine | Admitting: Internal Medicine

## 2021-07-23 ENCOUNTER — Encounter: Payer: Self-pay | Admitting: Emergency Medicine

## 2021-07-23 DIAGNOSIS — J209 Acute bronchitis, unspecified: Secondary | ICD-10-CM

## 2021-07-23 MED ORDER — IBUPROFEN 600 MG PO TABS: 600.0000 mg | ORAL_TABLET | Freq: Four times a day (QID) | ORAL | 0 refills | Status: AC | PRN

## 2021-07-23 MED ORDER — ALBUTEROL SULFATE HFA 108 (90 BASE) MCG/ACT IN AERS: 1.0000 | INHALATION_SPRAY | Freq: Four times a day (QID) | RESPIRATORY_TRACT | 0 refills | Status: AC | PRN

## 2021-07-23 MED ORDER — BENZONATATE 100 MG PO CAPS: 100.0000 mg | ORAL_CAPSULE | Freq: Three times a day (TID) | ORAL | 0 refills | Status: AC | PRN

## 2021-07-23 MED ORDER — PREDNISONE 20 MG PO TABS: 20.0000 mg | ORAL_TABLET | Freq: Every day | ORAL | 0 refills | Status: AC

## 2021-07-23 NOTE — Discharge Instructions (Signed)
Use medications as prescribed Return to urgent care if symptoms worsen Smoke cessation advised

## 2021-07-23 NOTE — ED Triage Notes (Signed)
Mid back pain that radiates to the front of chest.  States she has been coughing x 3 weeks.  Cough was productive, now cough is dry.

## 2021-07-24 ENCOUNTER — Other Ambulatory Visit: Payer: Self-pay | Admitting: Physical Medicine and Rehabilitation

## 2021-07-24 DIAGNOSIS — M25552 Pain in left hip: Secondary | ICD-10-CM

## 2021-07-24 NOTE — ED Provider Notes (Signed)
[facility]    CSN: [id number] Arrival date & time: 07/23/21  1751      History   Chief Complaint No chief complaint on file.   HPI Dawn Edwards is a 55 y.o. female comes to the urgent care with a cough of 3 weeks duration.  Cough was initially productive but is currently unproductive.  Cough is associated with mid back pain radiating around the sides of the chest to the front.  No difficulty breathing.  No shortness of breath or wheezing.  No fever or chills.  No sore throat.  No sick contacts.  No nausea vomiting or diarrhea.  No trauma to the chest.  Patient admits to having some wheezing with cough.  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   Right Upper Zygomatic Area TX CX3 5FU    Patient Active Problem List   Diagnosis Date Noted   Atypical pneumonia 08/14/2018   Nausea vomiting and diarrhea 08/14/2018   Hyponatremia 08/14/2018   Hypokalemia 08/14/2018   Reactive thrombocytosis 08/14/2018   Anxiety 08/14/2018   Loss of weight 06/15/2018   Abdominal pain 03/11/2018   Loose stools 03/11/2018    Past Surgical History:  Procedure Laterality Date   APPENDECTOMY     CESAREAN SECTION     COLONOSCOPY N/A 05/04/2018   Procedure: COLONOSCOPY;  Surgeon: Daneil Dolin, MD;  Location: [facility];  Service: Endoscopy;  Laterality: N/A;  2:00pm   PILONIDAL CYST EXCISION     ROTATOR CUFF REPAIR Right     OB History   No obstetric history on file.      Home Medications    Prior to Admission medications   Medication Sig Start Date End Date Taking? Authorizing Provider  albuterol (VENTOLIN HFA) 108 (90 Base) MCG/ACT inhaler Inhale 1-2 puffs into the lungs every 6 (six) hours as needed for wheezing or shortness of breath. 07/23/21  Yes Masaye Gatchalian, Myrene Galas, MD  benzonatate (TESSALON) 100  MG capsule Take 1 capsule (100 mg total) by mouth 3 (three) times daily as needed for cough. 07/23/21  Yes Railynn Ballo, Myrene Galas, MD  ibuprofen (ADVIL) 600 MG tablet Take 1 tablet (600 mg total) by mouth every 6 (six) hours as needed. 07/23/21  Yes Kate Sweetman, Myrene Galas, MD  predniSONE (DELTASONE) 20 MG tablet Take 1 tablet (20 mg total) by mouth daily for 5 days. 07/23/21 07/28/21 Yes Shaydon Lease, Myrene Galas, MD  ALPRAZolam Duanne Moron) 0.5 MG tablet alprazolam 0.5 mg tablet  TAKE 1 TO 2 TABLETS BY MOUTH AT BEDTIME AND TWICE DAILY FOR PANIC IF NEEDED    [provider]  mupirocin ointment (BACTROBAN) 2 % Apply 1 application topically daily. Patient not taking: Reported on 04/05/2020 01/25/20   Lavonna Monarch, MD    Family History Family History  Problem Relation Age of Onset   Gastric cancer Father    Colon cancer Neg Hx    Esophageal cancer Neg Hx     Social History Social History   Tobacco Use   Smoking status: Every Day    Packs/day: 1.00    Types: Cigarettes   Smokeless tobacco: Never  Vaping Use   Vaping Use: Every day  Substance Use Topics   Alcohol use: No   Drug use: Yes    Types: Marijuana  Comment: occasional, about 3 times a month     Allergies   Adhesive [tape] and Phenergan [promethazine hcl]   Review of Systems Review of Systems  HENT: Negative.    Respiratory:  Positive for cough, chest tightness, shortness of breath and wheezing.   Cardiovascular:  Negative for chest pain and palpitations.  Gastrointestinal: Negative.   Neurological: Negative.     Physical Exam Triage Vital Signs ED Triage Vitals [07/23/21 1942]  Enc Vitals Group     BP (!) 184/80     Pulse Rate 64     Resp 16     Temp 98.2 F (36.8 C)     Temp Source Oral     SpO2 98 %     Weight      Height      Head Circumference      Peak Flow      Pain Score 8     Pain Loc      Pain Edu?      Excl. in [place]?    No data found.  Updated Vital Signs BP (!) 184/80 (BP Location: Right Arm)    Pulse 64   Temp 98.2 F (36.8 C) (Oral)   Resp 16   LMP 12/03/2016 Comment: pt says periods stopped last jan  SpO2 98%   Visual Acuity Right Eye Distance:   Left Eye Distance:   Bilateral Distance:    Right Eye Near:   Left Eye Near:    Bilateral Near:     Physical Exam Vitals and nursing note reviewed.  Constitutional:      General: She is not in acute distress.    Appearance: She is not ill-appearing.  Cardiovascular:     Rate and Rhythm: Normal rate and regular rhythm.     Pulses: Normal pulses.     Heart sounds: Normal heart sounds.  Pulmonary:     Effort: Pulmonary effort is normal. No respiratory distress.     Breath sounds: Wheezing present. No rhonchi or rales.  Chest:     Chest wall: No tenderness.  Abdominal:     General: Bowel sounds are normal.     Palpations: Abdomen is soft.  Musculoskeletal:        General: Normal range of motion.     Comments: Full range of motion of the thoracolumbar spine.  Neurological:     Mental Status: She is alert.     UC Treatments / Results  Labs (all labs ordered are listed, but only abnormal results are displayed) Labs Reviewed - No data to display  EKG   Radiology No results found.  Procedures Procedures (including critical care time)  Medications Ordered in UC Medications - No data to display  Initial Impression / Assessment and Plan / UC Course  I have reviewed the triage vital signs and the nursing notes.  Pertinent labs & imaging results that were available during my care of the patient were reviewed by me and considered in my medical decision making (see chart for details).     1.  Acute bronchitis: Prednisone 20 mg orally daily for 5 days Tessalon Perles as needed for cough Albuterol inhaler as needed for chest tightness Ibuprofen 600 mg every 6 hours as needed for pain and/or fever Return to urgent care if symptoms worsen Your lung exam was unremarkable.  No indication for chest x-ray at this  time. Final Clinical Impressions(s) / UC Diagnoses   Final diagnoses:  Acute bronchitis, unspecified organism  Discharge Instructions      Use medications as prescribed Return to urgent care if symptoms worsen Smoke cessation advised    ED Prescriptions     Medication Sig Dispense Auth. Provider   albuterol (VENTOLIN HFA) 108 (90 Base) MCG/ACT inhaler Inhale 1-2 puffs into the lungs every 6 (six) hours as needed for wheezing or shortness of breath. 18 g Cristiana Yochim, Myrene Galas, MD   predniSONE (DELTASONE) 20 MG tablet Take 1 tablet (20 mg total) by mouth daily for 5 days. 5 tablet Aalaiyah Yassin, Myrene Galas, MD   benzonatate (TESSALON) 100 MG capsule Take 1 capsule (100 mg total) by mouth 3 (three) times daily as needed for cough. 21 capsule Christo Hain, Myrene Galas, MD   ibuprofen (ADVIL) 600 MG tablet Take 1 tablet (600 mg total) by mouth every 6 (six) hours as needed. 30 tablet Natalyah Cummiskey, Myrene Galas, MD      PDMP not reviewed this encounter.   Chase Picket, MD 07/24/21 [phone]

## 2021-07-31 ENCOUNTER — Ambulatory Visit
Admission: RE | Admit: 2021-07-31 | Discharge: 2021-07-31 | Disposition: A | Discharge status: Home / Self Care | Payer: 59 | Source: Ambulatory Visit | Attending: Physical Medicine and Rehabilitation | Admitting: Physical Medicine and Rehabilitation

## 2021-07-31 ENCOUNTER — Other Ambulatory Visit: Payer: Self-pay

## 2021-07-31 DIAGNOSIS — M25552 Pain in left hip: Secondary | ICD-10-CM

## 2021-07-31 MED ORDER — METHYLPREDNISOLONE ACETATE 40 MG/ML INJ SUSP (RADIOLOG
80.0000 mg | Freq: Once | INTRAMUSCULAR | Status: AC
  Administered 2021-07-31: 40 mg via INTRA_ARTICULAR

## 2021-07-31 MED ORDER — IOPAMIDOL (ISOVUE-M 200) INJECTION 41%
1.0000 mL | Freq: Once | INTRAMUSCULAR | Status: AC
  Administered 2021-07-31: 1 mL via INTRA_ARTICULAR

## 2021-11-04 ENCOUNTER — Ambulatory Visit
Admission: EM | Admit: 2021-11-04 | Discharge: 2021-11-04 | Disposition: A | Discharge status: Home / Self Care | Payer: 59 | Attending: Family Medicine | Admitting: Family Medicine

## 2021-11-04 ENCOUNTER — Other Ambulatory Visit: Payer: Self-pay

## 2021-11-04 DIAGNOSIS — H01001 Unspecified blepharitis right upper eyelid: Secondary | ICD-10-CM | POA: Diagnosis not present

## 2021-11-04 DIAGNOSIS — H00011 Hordeolum externum right upper eyelid: Secondary | ICD-10-CM

## 2021-11-04 MED ORDER — ERYTHROMYCIN 5 MG/GM OP OINT: TOPICAL_OINTMENT | OPHTHALMIC | 0 refills | Status: AC

## 2021-11-04 MED ORDER — CEPHALEXIN 500 MG PO CAPS: 500.0000 mg | ORAL_CAPSULE | Freq: Two times a day (BID) | ORAL | 0 refills | Status: AC

## 2021-11-04 NOTE — ED Triage Notes (Signed)
Pt presents with right eye upper lid redness and swelling for past few days,pt states pain extending into jaw

## 2021-11-08 NOTE — ED Provider Notes (Signed)
[facility]    CSN: [id number] Arrival date & time: 11/04/21  1779      History   Chief Complaint Chief Complaint  Patient presents with   Eye Problem    HPI Dawn Edwards is a 55 y.o. female.   Presenting with right upper eyelid redness, swelling and tenderness the past few days now. Denies headache, injury to eye/face, fever, chills, visual changes or globe involvement, N/V. Trying warm compresses with no benefit.    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   Right Upper Zygomatic Area TX CX3 5FU    Patient Active Problem List   Diagnosis Date Noted   Atypical pneumonia 08/14/2018   Nausea vomiting and diarrhea 08/14/2018   Hyponatremia 08/14/2018   Hypokalemia 08/14/2018   Reactive thrombocytosis 08/14/2018   Anxiety 08/14/2018   Loss of weight 06/15/2018   Abdominal pain 03/11/2018   Loose stools 03/11/2018    Past Surgical History:  Procedure Laterality Date   APPENDECTOMY     CESAREAN SECTION     COLONOSCOPY N/A 05/04/2018   Procedure: COLONOSCOPY;  Surgeon: Daneil Dolin, MD;  Location: [facility];  Service: Endoscopy;  Laterality: N/A;  2:00pm   PILONIDAL CYST EXCISION     ROTATOR CUFF REPAIR Right     OB History   No obstetric history on file.      Home Medications    Prior to Admission medications   Medication Sig Start Date End Date Taking? Authorizing Provider  cephALEXin (KEFLEX) 500 MG capsule Take 1 capsule (500 mg total) by mouth 2 (two) times daily. 11/04/21  Yes Volney American, PA-C  erythromycin ophthalmic ointment Place a 1/2 inch ribbon of ointment into the lower eyelid. 11/04/21  Yes Volney American, PA-C  albuterol (VENTOLIN HFA) 108 (90 Base) MCG/ACT inhaler Inhale 1-2 puffs into the lungs every 6 (six) hours as needed for wheezing  or shortness of breath. 07/23/21   Lamptey, Myrene Galas, MD  ALPRAZolam Duanne Moron) 0.5 MG tablet alprazolam 0.5 mg tablet  TAKE 1 TO 2 TABLETS BY MOUTH AT BEDTIME AND TWICE DAILY FOR PANIC IF NEEDED    [provider]  benzonatate (TESSALON) 100 MG capsule Take 1 capsule (100 mg total) by mouth 3 (three) times daily as needed for cough. 07/23/21   Chase Picket, MD  ibuprofen (ADVIL) 600 MG tablet Take 1 tablet (600 mg total) by mouth every 6 (six) hours as needed. 07/23/21   LampteyMyrene Galas, MD  mupirocin ointment (BACTROBAN) 2 % Apply 1 application topically daily. Patient not taking: Reported on 04/05/2020 01/25/20   Lavonna Monarch, MD    Family History Family History  Problem Relation Age of Onset   Gastric cancer Father    Colon cancer Neg Hx    Esophageal cancer Neg Hx     Social History Social History   Tobacco Use   Smoking status: Every Day    Packs/day: 1.00    Types: Cigarettes   Smokeless tobacco: Never  Vaping Use   Vaping Use: Every day  Substance Use Topics   Alcohol use: No   Drug use: Yes    Types: Marijuana    Comment: occasional, about 3 times a month     Allergies   Adhesive [tape] and Phenergan [  promethazine hcl]   Review of Systems Review of Systems PER HPI   Physical Exam Triage Vital Signs ED Triage Vitals  Enc Vitals Group     BP 11/04/21 1230 116/78     Pulse Rate 11/04/21 1230 (!) 56     Resp 11/04/21 1230 18     Temp 11/04/21 1230 98.3 F (36.8 C)     Temp src --      SpO2 11/04/21 1230 96 %     Weight --      Height --      Head Circumference --      Peak Flow --      Pain Score 11/04/21 1229 5     Pain Loc --      Pain Edu? --      Excl. in [place]? --    No data found.  Updated Vital Signs BP 116/78    Pulse (!) 56    Temp 98.3 F (36.8 C)    Resp 18    LMP 12/03/2016 Comment: pt says periods stopped last jan   SpO2 96%   Visual Acuity Right Eye Distance:   Left Eye Distance:   Bilateral Distance:    Right Eye  Near:   Left Eye Near:    Bilateral Near:     Physical Exam Vitals and nursing note reviewed.  Constitutional:      Appearance: Normal appearance. She is not ill-appearing.  HENT:     Head: Atraumatic.  Eyes:     Extraocular Movements: Extraocular movements intact.     Conjunctiva/sclera: Conjunctivae normal.     Pupils: Pupils are equal, round, and reactive to light.     Comments: Right upper eyelid erythematous, edematous, ttp  Cardiovascular:     Rate and Rhythm: Normal rate and regular rhythm.     Heart sounds: Normal heart sounds.  Pulmonary:     Effort: Pulmonary effort is normal.     Breath sounds: Normal breath sounds.  Musculoskeletal:        General: Normal range of motion.     Cervical back: Normal range of motion and neck supple.  Skin:    General: Skin is warm and dry.  Neurological:     Mental Status: She is alert and oriented to person, place, and time.  Psychiatric:        Mood and Affect: Mood normal.        Thought Content: Thought content normal.        Judgment: Judgment normal.     UC Treatments / Results  Labs (all labs ordered are listed, but only abnormal results are displayed) Labs Reviewed - No data to display  EKG   Radiology No results found.  Procedures Procedures (including critical care time)  Medications Ordered in UC Medications - No data to display  Initial Impression / Assessment and Plan / UC Course  I have reviewed the triage vital signs and the nursing notes.  Pertinent labs & imaging results that were available during my care of the patient were reviewed by me and considered in my medical decision making (see chart for details).     Will treat with keflex, erythromycin ointment and warm compresses. Return for acutely worsening sxs.   Final Clinical Impressions(s) / UC Diagnoses   Final diagnoses:  Hordeolum externum of right upper eyelid  Blepharitis of right upper eyelid, unspecified type   Discharge  Instructions   None    ED Prescriptions  Medication Sig Dispense Auth. Provider   erythromycin ophthalmic ointment Place a 1/2 inch ribbon of ointment into the lower eyelid. 3.5 g Volney American, PA-C   cephALEXin (KEFLEX) 500 MG capsule Take 1 capsule (500 mg total) by mouth 2 (two) times daily. 14 capsule Volney American, [place]      PDMP not reviewed this encounter.   Volney American, [place] 11/08/21 2334

## 2022-11-19 ENCOUNTER — Other Ambulatory Visit: Payer: Self-pay | Admitting: Family Medicine

## 2022-11-19 ENCOUNTER — Ambulatory Visit: Payer: Self-pay

## 2022-11-19 DIAGNOSIS — M25552 Pain in left hip: Secondary | ICD-10-CM

## 2022-11-19 DIAGNOSIS — R52 Pain, unspecified: Secondary | ICD-10-CM

## 2023-03-24 ENCOUNTER — Encounter: Payer: Self-pay | Admitting: *Deleted

## 2023-04-22 ENCOUNTER — Other Ambulatory Visit: Payer: Self-pay

## 2023-04-22 ENCOUNTER — Emergency Department (HOSPITAL_COMMUNITY): Payer: 59

## 2023-04-22 ENCOUNTER — Emergency Department (HOSPITAL_COMMUNITY)
Admission: EM | Admit: 2023-04-22 | Discharge: 2023-04-22 | Disposition: A | Discharge status: Home / Self Care | Payer: 59 | Attending: Emergency Medicine | Admitting: Emergency Medicine

## 2023-04-22 DIAGNOSIS — I10 Essential (primary) hypertension: Secondary | ICD-10-CM | POA: Insufficient documentation

## 2023-04-22 DIAGNOSIS — F17210 Nicotine dependence, cigarettes, uncomplicated: Secondary | ICD-10-CM | POA: Insufficient documentation

## 2023-04-22 DIAGNOSIS — R079 Chest pain, unspecified: Secondary | ICD-10-CM | POA: Diagnosis present

## 2023-04-22 DIAGNOSIS — R0602 Shortness of breath: Secondary | ICD-10-CM | POA: Diagnosis not present

## 2023-04-22 LAB — CBC
HCT: 40.9 % (ref 36.0–46.0)
Hemoglobin: 13.6 g/dL (ref 12.0–15.0)
MCH: 29.8 pg (ref 26.0–34.0)
MCHC: 33.3 g/dL (ref 30.0–36.0)
MCV: 89.5 fL (ref 80.0–100.0)
Platelets: 369 10*3/uL (ref 150–400)
RBC: 4.57 MIL/uL (ref 3.87–5.11)
RDW: 12.6 % (ref 11.5–15.5)
WBC: 8.1 10*3/uL (ref 4.0–10.5)
nRBC: 0 % (ref 0.0–0.2)

## 2023-04-22 LAB — BASIC METABOLIC PANEL
Anion gap: 10 (ref 5–15)
BUN: 12 mg/dL (ref 6–20)
CO2: 23 mmol/L (ref 22–32)
Calcium: 8.8 mg/dL — ABNORMAL LOW (ref 8.9–10.3)
Chloride: 102 mmol/L (ref 98–111)
Creatinine, Ser: 0.89 mg/dL (ref 0.44–1.00)
GFR, Estimated: >60 mL/min (ref >60)
Glucose, Bld: 112 mg/dL — ABNORMAL HIGH (ref 70–99)
Potassium: 3.6 mmol/L (ref 3.5–5.1)
Sodium: 135 mmol/L (ref 135–145)

## 2023-04-22 LAB — TROPONIN I (HIGH SENSITIVITY)
Troponin I (High Sensitivity): 6 ng/L (ref <18)
Troponin I (High Sensitivity): 5 ng/L (ref <18)

## 2023-04-22 MED ORDER — HYDROMORPHONE HCL 1 MG/ML IJ SOLN
0.5000 mg | Freq: Once | INTRAMUSCULAR | Status: AC
  Administered 2023-04-22: 0.5 mg via INTRAVENOUS
  Filled 2023-04-22: qty 0.5

## 2023-04-22 MED ORDER — IOHEXOL 350 MG/ML SOLN
100.0000 mL | Freq: Once | INTRAVENOUS | Status: AC | PRN
  Administered 2023-04-22: 100 mL via INTRAVENOUS

## 2023-04-22 MED ORDER — NAPROXEN 500 MG PO TABS: 500.0000 mg | ORAL_TABLET | Freq: Two times a day (BID) | ORAL | 0 refills | Status: AC

## 2023-04-22 MED ORDER — HYDROCODONE-ACETAMINOPHEN 5-325 MG PO TABS: 1.0000 | ORAL_TABLET | ORAL | 0 refills | Status: AC | PRN

## 2023-04-22 NOTE — ED Provider Notes (Signed)
[facility]-EMERGENCY DEPT [facility] Emergency Department Provider Note MRN:  [id number]  Arrival date & time: 04/22/23     Chief Complaint   Chest Pain   History of Present Illness   Dawn Edwards is a 57 y.o. year-old female with a history of hypertension presenting to the ED with chief complaint of chest pain.  Central chest pain with shortness of breath, hurts to take a deep breath, sudden onset at 3 AM waking her from sleep.  Review of Systems  A thorough review of systems was obtained and all systems are negative except as noted in the HPI and PMH.   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   Right Upper Zygomatic Area TX CX3 5FU    Past Surgical History:  Procedure Laterality Date   APPENDECTOMY     CESAREAN SECTION     COLONOSCOPY N/A 05/04/2018   Procedure: COLONOSCOPY;  Surgeon: Corbin Ade, MD;  Location: [facility] ENDO SUITE;  Service: Endoscopy;  Laterality: N/A;  2:00pm   PILONIDAL CYST EXCISION     ROTATOR CUFF REPAIR Right     Family History  Problem Relation Age of Onset   Gastric cancer Father    Colon cancer Neg Hx    Esophageal cancer Neg Hx     Social History   Socioeconomic History   Marital status: Single    Spouse name: Not on file   Number of children: Not on file   Years of education: Not on file   Highest education level: Not on file  Occupational History   Not on file  Tobacco Use   Smoking status: Every Day    Packs/day: 1    Types: Cigarettes   Smokeless tobacco: Never  Vaping Use   Vaping Use: Every day  Substance and Sexual Activity   Alcohol use: No   Drug use: Yes    Types: Marijuana    Comment: occasional, about 3 times a month   Sexual activity: Not Currently  Other Topics Concern   Not on file  Social History Narrative   Not  on file   Social Determinants of Health   Financial Resource Strain: Not on file  Food Insecurity: Not on file  Transportation Needs: Not on file  Physical Activity: Not on file  Stress: Not on file  Social Connections: Not on file  Intimate Partner Violence: Not on file     Physical Exam   Vitals:   04/22/23 0330 04/22/23 0337  BP: (!) 162/89   Pulse: 74   Resp: (!) 24   Temp:  97.9 F (36.6 C)  SpO2: 97%     CONSTITUTIONAL: Well-appearing, moderate distress due to pain NEURO/PSYCH:  Alert and oriented x 3, no focal deficits EYES:  eyes equal and reactive ENT/NECK:  no LAD, no JVD CARDIO: Regular rate, well-perfused, normal S1 and S2 PULM:  CTAB no wheezing or rhonchi, mild tachypnea GI/GU:  non-distended, non-tender MSK/SPINE:  No gross deformities, no edema SKIN:  no rash, atraumatic   *Additional and/or pertinent findings included in MDM below  Diagnostic and Interventional Summary    EKG Interpretation  Date/Time:  Wednesday April 22 2023 03:26:39 EDT Ventricular Rate:  73 PR Interval:  196 QRS Duration: 79 QT Interval:  397  QTC Calculation: 438 R Axis:   76 Text Interpretation: Sinus rhythm Confirmed by Kennis Carina [phone]) on 04/22/2023 3:31:20 AM       Labs Reviewed  BASIC METABOLIC PANEL - Abnormal; Notable for the following components:      Result Value   Glucose, Bld 112 (*)    Calcium 8.8 (*)    All other components within normal limits  CBC  TROPONIN I (HIGH SENSITIVITY)  TROPONIN I (HIGH SENSITIVITY)    CT Angio Chest Pulmonary Embolism (PE) W or WO Contrast  Final Result    DG Chest 2 View  Final Result      Medications  HYDROmorphone (DILAUDID) injection 0.5 mg (0.5 mg Intravenous Given 04/22/23 0353)  iohexol (OMNIPAQUE) 350 MG/ML injection 100 mL (100 mLs Intravenous Contrast Given 04/22/23 0417)     Procedures  /  Critical Care Procedures  ED Course and Medical Decision Making  Initial Impression and Ddx Differential  diagnosis includes ACS, PE, less likely dissection.  Past medical/surgical history that increases complexity of ED encounter: Hypertension  Interpretation of Diagnostics I personally reviewed the EKG and my interpretation is as follows: Sinus rhythm without concerning ischemic features  Labs reassuring with no significant blood count or electrolyte disturbance.  Troponin negative x 2.  CTA is without evidence of PE or any other abnormalities.  Patient Reassessment and Ultimate Disposition/Management     Patient is feeling a lot better.  Very atypical pleuritic pain, favoring MSK versus pleurisy.  Overall doubt ACS.  Heart score is very low.  Appropriate for discharge with PCP/cardiology follow-up.  Patient management required discussion with the following services or consulting groups:  None  Complexity of Problems Addressed Acute illness or injury that poses threat of life of bodily function  Additional Data Reviewed and Analyzed Further history obtained from: Further history from spouse/family member  Additional Factors Impacting ED Encounter Risk Prescriptions, Use of parenteral controlled substances, and Consideration of hospitalization  Elmer Sow. Pilar Plate, MD [facility] Emergency Medicine [facility] [email or web address]  Final Clinical Impressions(s) / ED Diagnoses     ICD-10-CM   1. Chest pain, unspecified type  R07.9       ED Discharge Orders          Ordered    naproxen (NAPROSYN) 500 MG tablet  2 times daily        04/22/23 0601    HYDROcodone-acetaminophen (NORCO/VICODIN) 5-325 MG tablet  Every 4 hours PRN        04/22/23 0601             Discharge Instructions Discussed with and Provided to Patient:     Discharge Instructions      You were evaluated in the Emergency Department and after careful evaluation, we did not find any emergent condition requiring admission or further testing in the hospital.  Your exam/testing today is  overall reassuring.  No signs of heart damage or heart strain, no signs of blood clots.  Recommend follow-up with your primary care doctor to discuss your symptoms.  Use the Naprosyn anti-inflammatory twice daily for pain.  Can use the Norco tablet for significant pain.  Pain may be due to pleurisy.  Please return to the Emergency Department if you experience any worsening of your condition.   Thank you for allowing [place] to be a part of your care.       Sabas Sous, MD 04/22/23 [phone]

## 2023-04-22 NOTE — ED Triage Notes (Signed)
Pt states she woke up with sudden chest pain that radiates to her neck, feels like she cannot catch her breath.

## 2023-04-22 NOTE — Discharge Instructions (Signed)
You were evaluated in the Emergency Department and after careful evaluation, we did not find any emergent condition requiring admission or further testing in the hospital.  Your exam/testing today is overall reassuring.  No signs of heart damage or heart strain, no signs of blood clots.  Recommend follow-up with your primary care doctor to discuss your symptoms.  Use the Naprosyn anti-inflammatory twice daily for pain.  Can use the Norco tablet for significant pain.  Pain may be due to pleurisy.  Please return to the Emergency Department if you experience any worsening of your condition.   Thank you for allowing [place] to be a part of your care.

## 2023-09-27 ENCOUNTER — Other Ambulatory Visit: Payer: Self-pay

## 2023-09-27 ENCOUNTER — Encounter (HOSPITAL_BASED_OUTPATIENT_CLINIC_OR_DEPARTMENT_OTHER): Payer: Self-pay

## 2023-09-27 ENCOUNTER — Emergency Department (HOSPITAL_BASED_OUTPATIENT_CLINIC_OR_DEPARTMENT_OTHER): Payer: 59 | Admitting: Radiology

## 2023-09-27 ENCOUNTER — Emergency Department (HOSPITAL_BASED_OUTPATIENT_CLINIC_OR_DEPARTMENT_OTHER)
Admission: EM | Admit: 2023-09-27 | Discharge: 2023-09-27 | Disposition: A | Discharge status: Home / Self Care | Payer: Worker's Compensation | Attending: Emergency Medicine | Admitting: Emergency Medicine

## 2023-09-27 DIAGNOSIS — W01198A Fall on same level from slipping, tripping and stumbling with subsequent striking against other object, initial encounter: Secondary | ICD-10-CM | POA: Insufficient documentation

## 2023-09-27 DIAGNOSIS — S6991XA Unspecified injury of right wrist, hand and finger(s), initial encounter: Secondary | ICD-10-CM | POA: Diagnosis present

## 2023-09-27 DIAGNOSIS — Y990 Civilian activity done for income or pay: Secondary | ICD-10-CM | POA: Insufficient documentation

## 2023-09-27 DIAGNOSIS — S6391XA Sprain of unspecified part of right wrist and hand, initial encounter: Secondary | ICD-10-CM | POA: Diagnosis not present

## 2023-09-27 NOTE — ED Triage Notes (Signed)
Patient arrives with complaints of having a mechanical fall at work today. She tripped on a palette that was on the floor, falling forward. Reports chin pain and right hand pain.

## 2023-09-27 NOTE — ED Provider Notes (Signed)
[place] EMERGENCY DEPARTMENT AT [facility] Provider Note   CSN: [id number] Arrival date & time: 09/27/23  1320     History  Chief Complaint  Patient presents with   Fall   Work Related Injury    Dawn Edwards is a 57 y.o. female.  57 year old female presents after chemical fall at work.  Patient states that she tripped over a pallet.  Fell down and struck her chin.  Denies any loss of consciousness.  Denies any headache or neck pain.  Complains of pain to her right hand but denies any pain at her right wrist.  No distal numbness or tingling to her hands.  Has any back or lower extremity pain.  Does note abrasion to her left shoulder but has full range of motion.  Denies any right shoulder pain as well 2.       Home Medications Prior to Admission medications   Medication Sig Start Date End Date Taking? Authorizing Provider  albuterol (VENTOLIN HFA) 108 (90 Base) MCG/ACT inhaler Inhale 1-2 puffs into the lungs every 6 (six) hours as needed for wheezing or shortness of breath. 07/23/21   Lamptey, Britta Mccreedy, MD  ALPRAZolam Prudy Feeler) 0.5 MG tablet alprazolam 0.5 mg tablet  TAKE 1 TO 2 TABLETS BY MOUTH AT BEDTIME AND TWICE DAILY FOR PANIC IF NEEDED    [provider]  benzonatate (TESSALON) 100 MG capsule Take 1 capsule (100 mg total) by mouth 3 (three) times daily as needed for cough. 07/23/21   Lamptey, Britta Mccreedy, MD  cephALEXin (KEFLEX) 500 MG capsule Take 1 capsule (500 mg total) by mouth 2 (two) times daily. 11/04/21   Particia Nearing, PA-C  erythromycin ophthalmic ointment Place a 1/2 inch ribbon of ointment into the lower eyelid. 11/04/21   Particia Nearing, PA-C  HYDROcodone-acetaminophen (NORCO/VICODIN) 5-325 MG tablet Take 1 tablet by mouth every 4 (four) hours as needed. 04/22/23   Sabas Sous, MD  ibuprofen (ADVIL) 600 MG tablet Take 1 tablet (600 mg total) by mouth every 6 (six) hours as needed. 07/23/21   LampteyBritta Mccreedy, MD  mupirocin  ointment (BACTROBAN) 2 % Apply 1 application topically daily. Patient not taking: Reported on 04/05/2020 01/25/20   Janalyn Harder, MD  naproxen (NAPROSYN) 500 MG tablet Take 1 tablet (500 mg total) by mouth 2 (two) times daily. 04/22/23   Sabas Sous, MD      Allergies    Adhesive [tape] and Phenergan [promethazine hcl]    Review of Systems   Review of Systems  All other systems reviewed and are negative.   Physical Exam Updated Vital Signs BP 133/69 (BP Location: Right Arm)   Pulse (!) 58   Temp 97.8 F (36.6 C) (Oral)   Resp 20   Ht 1.676 m (5\' 6" )   Wt 81.6 kg   LMP 12/03/2016 Comment: pt says periods stopped last jan  SpO2 98%   BMI 29.05 kg/m  Physical Exam Vitals and nursing note reviewed.  Constitutional:      General: She is not in acute distress.    Appearance: Normal appearance. She is well-developed. She is not toxic-appearing.  HENT:     Head:   Eyes:     General: Lids are normal.     Conjunctiva/sclera: Conjunctivae normal.     Pupils: Pupils are equal, round, and reactive to light.  Neck:     Thyroid: No thyroid mass.     Trachea: No tracheal deviation.  Cardiovascular:  Rate and Rhythm: Normal rate and regular rhythm.     Heart sounds: Normal heart sounds. No murmur heard.    No gallop.  Pulmonary:     Effort: Pulmonary effort is normal. No respiratory distress.     Breath sounds: Normal breath sounds. No stridor. No decreased breath sounds, wheezing, rhonchi or rales.  Abdominal:     General: There is no distension.     Palpations: Abdomen is soft.     Tenderness: There is no abdominal tenderness. There is no rebound.  Musculoskeletal:        General: No tenderness. Normal range of motion.       Arms:     Cervical back: Normal range of motion and neck supple.     Comments: Full range of motion at patient's wrist.  Tender along the dorsal aspect of her hand between the thumb and index finger  Skin:    General: Skin is warm and dry.      Findings: No abrasion or rash.  Neurological:     Mental Status: She is alert and oriented to person, place, and time. Mental status is at baseline.     GCS: GCS eye subscore is 4. GCS verbal subscore is 5. GCS motor subscore is 6.     Cranial Nerves: Cranial nerves are intact. No cranial nerve deficit.     Sensory: No sensory deficit.     Motor: Motor function is intact.  Psychiatric:        Attention and Perception: Attention normal.        Speech: Speech normal.        Behavior: Behavior normal.     ED Results / Procedures / Treatments   Labs (all labs ordered are listed, but only abnormal results are displayed) Labs Reviewed - No data to display  EKG None  Radiology No results found.  Procedures Procedures    Medications Ordered in ED Medications - No data to display  ED Course/ Medical Decision Making/ A&P                                 Medical Decision Making Amount and/or Complexity of Data Reviewed Radiology: ordered.   Right hand and right wrist x-rays negative for interpretation.  Hand splint applied for comfort.  No neurological deficits.  Will discharge        Final Clinical Impression(s) / ED Diagnoses Final diagnoses:  None    Rx / DC Orders ED Discharge Orders     None         Lorre Nick, MD 09/27/23 1453

## 2023-10-01 ENCOUNTER — Ambulatory Visit: Payer: Self-pay

## 2023-10-01 ENCOUNTER — Other Ambulatory Visit: Payer: Self-pay | Admitting: Nurse Practitioner

## 2023-10-01 DIAGNOSIS — M25512 Pain in left shoulder: Secondary | ICD-10-CM

## 2024-11-14 ENCOUNTER — Encounter: Payer: Self-pay | Admitting: Internal Medicine
